# Patient Record
Sex: Male | Born: 1935 | Race: White | Hispanic: No | Smoking: Former smoker
Health system: Southern US, Community
[De-identification: ages and names within clinical notes are randomized; demographics above are authoritative.]

## PROBLEM LIST (undated history)

## (undated) DIAGNOSIS — I10 Essential (primary) hypertension: Secondary | ICD-10-CM

## (undated) DIAGNOSIS — Z955 Presence of coronary angioplasty implant and graft: Secondary | ICD-10-CM

## (undated) DIAGNOSIS — I82409 Acute embolism and thrombosis of unspecified deep veins of unspecified lower extremity: Secondary | ICD-10-CM

## (undated) DIAGNOSIS — Z96651 Presence of right artificial knee joint: Secondary | ICD-10-CM

## (undated) DIAGNOSIS — C801 Malignant (primary) neoplasm, unspecified: Secondary | ICD-10-CM

## (undated) DIAGNOSIS — Z9049 Acquired absence of other specified parts of digestive tract: Secondary | ICD-10-CM

---
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
## 2004-07-11 ENCOUNTER — Other Ambulatory Visit: Payer: Self-pay

## 2004-07-11 ENCOUNTER — Emergency Department: Payer: Self-pay | Admitting: Emergency Medicine

## 2004-08-22 HISTORY — PX: KNEE ARTHROSCOPY: SHX127

## 2004-08-22 HISTORY — PX: SHOULDER SURGERY: SHX246

## 2004-10-16 ENCOUNTER — Emergency Department: Payer: Self-pay | Admitting: General Practice

## 2004-10-16 ENCOUNTER — Other Ambulatory Visit: Payer: Self-pay

## 2005-03-25 ENCOUNTER — Ambulatory Visit: Payer: Self-pay | Admitting: Specialist

## 2005-03-30 ENCOUNTER — Ambulatory Visit: Payer: Self-pay | Admitting: Specialist

## 2005-09-28 ENCOUNTER — Inpatient Hospital Stay: Payer: Self-pay | Admitting: Specialist

## 2005-09-29 ENCOUNTER — Other Ambulatory Visit: Payer: Self-pay

## 2005-10-05 ENCOUNTER — Encounter: Payer: Self-pay | Admitting: Internal Medicine

## 2006-08-22 HISTORY — PX: JOINT REPLACEMENT: SHX530

## 2009-08-22 HISTORY — PX: CATARACT EXTRACTION W/ INTRAOCULAR LENS  IMPLANT, BILATERAL: SHX1307

## 2010-06-25 ENCOUNTER — Inpatient Hospital Stay: Payer: Self-pay | Admitting: Internal Medicine

## 2010-07-01 ENCOUNTER — Emergency Department: Payer: Self-pay | Admitting: Emergency Medicine

## 2010-07-05 ENCOUNTER — Other Ambulatory Visit: Payer: Self-pay | Admitting: Internal Medicine

## 2010-08-22 HISTORY — PX: CORONARY ANGIOPLASTY WITH STENT PLACEMENT: SHX49

## 2010-09-27 ENCOUNTER — Ambulatory Visit: Payer: Self-pay | Admitting: Anesthesiology

## 2010-10-01 ENCOUNTER — Ambulatory Visit: Payer: Self-pay | Admitting: Anesthesiology

## 2010-11-12 ENCOUNTER — Ambulatory Visit: Payer: Self-pay | Admitting: Anesthesiology

## 2010-12-13 ENCOUNTER — Ambulatory Visit: Payer: Self-pay | Admitting: Anesthesiology

## 2011-02-14 ENCOUNTER — Ambulatory Visit: Payer: Self-pay | Admitting: Anesthesiology

## 2011-03-02 ENCOUNTER — Inpatient Hospital Stay: Payer: Self-pay | Admitting: Internal Medicine

## 2011-04-20 ENCOUNTER — Ambulatory Visit: Payer: Self-pay | Admitting: Anesthesiology

## 2011-06-15 ENCOUNTER — Ambulatory Visit: Payer: Self-pay | Admitting: Anesthesiology

## 2011-11-09 ENCOUNTER — Ambulatory Visit: Payer: Self-pay | Admitting: Internal Medicine

## 2011-11-30 ENCOUNTER — Ambulatory Visit: Payer: Self-pay | Admitting: Physical Medicine and Rehabilitation

## 2012-03-14 ENCOUNTER — Ambulatory Visit: Payer: Self-pay | Admitting: Internal Medicine

## 2012-05-18 ENCOUNTER — Ambulatory Visit: Payer: Self-pay | Admitting: Unknown Physician Specialty

## 2012-10-07 ENCOUNTER — Observation Stay: Payer: Self-pay | Admitting: Internal Medicine

## 2012-10-07 LAB — CBC
HCT: 37.2 % — ABNORMAL LOW (ref 40.0–52.0)
HGB: 12.8 g/dL — ABNORMAL LOW (ref 13.0–18.0)
MCH: 29.8 pg (ref 26.0–34.0)
MCHC: 34.4 g/dL (ref 32.0–36.0)
RBC: 4.29 10*6/uL — ABNORMAL LOW (ref 4.40–5.90)
RDW: 13.2 % (ref 11.5–14.5)
WBC: 9 10*3/uL (ref 3.8–10.6)

## 2012-10-07 LAB — URINALYSIS, COMPLETE
Bilirubin,UR: NEGATIVE
Blood: NEGATIVE
Nitrite: NEGATIVE
Protein: 30
Squamous Epithelial: NONE SEEN

## 2012-10-07 LAB — COMPREHENSIVE METABOLIC PANEL
Albumin: 3.3 g/dL — ABNORMAL LOW (ref 3.4–5.0)
Anion Gap: 7 (ref 7–16)
BUN: 31 mg/dL — ABNORMAL HIGH (ref 7–18)
Bilirubin,Total: 1.2 mg/dL — ABNORMAL HIGH (ref 0.2–1.0)
Calcium, Total: 8.5 mg/dL (ref 8.5–10.1)
Chloride: 109 mmol/L — ABNORMAL HIGH (ref 98–107)
Co2: 25 mmol/L (ref 21–32)
Creatinine: 1.22 mg/dL (ref 0.60–1.30)
EGFR (African American): >60
EGFR (Non-African Amer.): 53 — ABNORMAL LOW
Glucose: 130 mg/dL — ABNORMAL HIGH (ref 65–99)
Osmolality: 290 (ref 275–301)
Potassium: 3.5 mmol/L (ref 3.5–5.1)

## 2012-10-07 LAB — TROPONIN I: Troponin-I: <0.02 ng/mL

## 2012-10-08 LAB — CBC WITH DIFFERENTIAL/PLATELET
Basophil #: 0 10*3/uL (ref 0.0–0.1)
Basophil %: 0.3 %
Eosinophil #: 0 10*3/uL (ref 0.0–0.7)
Eosinophil %: 0.3 %
HGB: 11.6 g/dL — ABNORMAL LOW (ref 13.0–18.0)
Lymphocyte #: 0.8 10*3/uL — ABNORMAL LOW (ref 1.0–3.6)
Lymphocyte %: 9.9 %
MCV: 88 fL (ref 80–100)
Monocyte #: 0.6 x10 3/mm (ref 0.2–1.0)
Monocyte %: 7.9 %
Neutrophil #: 6.4 10*3/uL (ref 1.4–6.5)
WBC: 7.8 10*3/uL (ref 3.8–10.6)

## 2012-10-08 LAB — BASIC METABOLIC PANEL
Anion Gap: 9 (ref 7–16)
BUN: 25 mg/dL — ABNORMAL HIGH (ref 7–18)
Calcium, Total: 8.1 mg/dL — ABNORMAL LOW (ref 8.5–10.1)
Co2: 26 mmol/L (ref 21–32)
EGFR (African American): 56 — ABNORMAL LOW
Glucose: 142 mg/dL — ABNORMAL HIGH (ref 65–99)
Potassium: 3.4 mmol/L — ABNORMAL LOW (ref 3.5–5.1)
Sodium: 143 mmol/L (ref 136–145)

## 2012-10-08 LAB — MAGNESIUM: Magnesium: 1.9 mg/dL

## 2012-10-08 LAB — TROPONIN I: Troponin-I: <0.02 ng/mL

## 2012-10-09 LAB — BASIC METABOLIC PANEL
Anion Gap: 9 (ref 7–16)
BUN: 21 mg/dL — ABNORMAL HIGH (ref 7–18)
Chloride: 112 mmol/L — ABNORMAL HIGH (ref 98–107)
Co2: 23 mmol/L (ref 21–32)
Creatinine: 1.17 mg/dL (ref 0.60–1.30)
EGFR (Non-African Amer.): 55 — ABNORMAL LOW
Glucose: 115 mg/dL — ABNORMAL HIGH (ref 65–99)
Potassium: 3.6 mmol/L (ref 3.5–5.1)
Sodium: 144 mmol/L (ref 136–145)

## 2012-10-09 LAB — CBC WITH DIFFERENTIAL/PLATELET
Basophil #: 0 10*3/uL (ref 0.0–0.1)
Basophil %: 0.6 %
Eosinophil %: 0.2 %
Lymphocyte %: 15.5 %
Lymphocytes: 15 %
MCH: 29.5 pg (ref 26.0–34.0)
MCV: 86 fL (ref 80–100)
Monocytes: 6 %
Neutrophil #: 3.6 10*3/uL (ref 1.4–6.5)
Segmented Neutrophils: 76 %

## 2012-10-09 LAB — LACTATE DEHYDROGENASE: LDH: 148 U/L (ref 85–241)

## 2012-10-09 LAB — URINE CULTURE

## 2012-10-10 LAB — PROTEIN ELECTROPHORESIS(ARMC)

## 2013-08-22 DIAGNOSIS — Z8589 Personal history of malignant neoplasm of other organs and systems: Secondary | ICD-10-CM

## 2013-08-22 HISTORY — DX: Personal history of malignant neoplasm of other organs and systems: Z85.89

## 2013-10-28 ENCOUNTER — Observation Stay: Payer: Self-pay | Admitting: Internal Medicine

## 2013-10-28 LAB — CBC WITH DIFFERENTIAL/PLATELET
BASOS ABS: 0 10*3/uL (ref 0.0–0.1)
Basophil %: 0.2 %
Eosinophil #: 0 10*3/uL (ref 0.0–0.7)
Eosinophil %: 0.6 %
HCT: 46 % (ref 40.0–52.0)
HGB: 15 g/dL (ref 13.0–18.0)
Lymphocyte #: 0.3 10*3/uL — ABNORMAL LOW (ref 1.0–3.6)
Lymphocyte %: 3.2 %
MCH: 29.5 pg (ref 26.0–34.0)
MCHC: 32.7 g/dL (ref 32.0–36.0)
MCV: 90 fL (ref 80–100)
MONOS PCT: 3.5 %
Monocyte #: 0.3 x10 3/mm (ref 0.2–1.0)
Neutrophil #: 7.5 10*3/uL — ABNORMAL HIGH (ref 1.4–6.5)
Neutrophil %: 92.5 %
Platelet: 99 10*3/uL — ABNORMAL LOW (ref 150–440)
RBC: 5.1 10*6/uL (ref 4.40–5.90)
RDW: 13.6 % (ref 11.5–14.5)
WBC: 8.1 10*3/uL (ref 3.8–10.6)

## 2013-10-28 LAB — COMPREHENSIVE METABOLIC PANEL
ALK PHOS: 62 U/L
Albumin: 3.8 g/dL (ref 3.4–5.0)
Anion Gap: 5 — ABNORMAL LOW (ref 7–16)
BILIRUBIN TOTAL: 0.8 mg/dL (ref 0.2–1.0)
BUN: 32 mg/dL — AB (ref 7–18)
Calcium, Total: 8.8 mg/dL (ref 8.5–10.1)
Chloride: 108 mmol/L — ABNORMAL HIGH (ref 98–107)
Co2: 28 mmol/L (ref 21–32)
Creatinine: 1.2 mg/dL (ref 0.60–1.30)
EGFR (Non-African Amer.): 53 — ABNORMAL LOW
GLUCOSE: 123 mg/dL — AB (ref 65–99)
OSMOLALITY: 290 (ref 275–301)
Potassium: 4 mmol/L (ref 3.5–5.1)
SGOT(AST): 19 U/L (ref 15–37)
SGPT (ALT): 22 U/L (ref 12–78)
SODIUM: 141 mmol/L (ref 136–145)
TOTAL PROTEIN: 7.1 g/dL (ref 6.4–8.2)

## 2013-10-28 LAB — CK-MB: CK-MB: 1 ng/mL (ref 0.5–3.6)

## 2013-10-28 LAB — TROPONIN I: Troponin-I: <0.02 ng/mL

## 2013-10-28 LAB — LIPASE, BLOOD: Lipase: 416 U/L — ABNORMAL HIGH (ref 73–393)

## 2013-10-29 LAB — CK-MB
CK-MB: 0.6 ng/mL (ref 0.5–3.6)
CK-MB: 0.8 ng/mL (ref 0.5–3.6)

## 2013-10-29 LAB — TROPONIN I: Troponin-I: <0.02 ng/mL

## 2013-10-29 LAB — CLOSTRIDIUM DIFFICILE(ARMC)

## 2013-10-30 LAB — BASIC METABOLIC PANEL
Anion Gap: 7 (ref 7–16)
BUN: 23 mg/dL — AB (ref 7–18)
CALCIUM: 7.9 mg/dL — AB (ref 8.5–10.1)
CHLORIDE: 109 mmol/L — AB (ref 98–107)
Co2: 24 mmol/L (ref 21–32)
Creatinine: 1.13 mg/dL (ref 0.60–1.30)
GFR CALC NON AF AMER: 57 — AB
Glucose: 90 mg/dL (ref 65–99)
OSMOLALITY: 283 (ref 275–301)
POTASSIUM: 3.2 mmol/L — AB (ref 3.5–5.1)
SODIUM: 140 mmol/L (ref 136–145)

## 2013-10-30 LAB — LIPASE, BLOOD: LIPASE: 94 U/L (ref 73–393)

## 2013-10-30 LAB — CBC WITH DIFFERENTIAL/PLATELET
BASOS PCT: 0.7 %
Basophil #: 0 10*3/uL (ref 0.0–0.1)
Eosinophil #: 0.1 10*3/uL (ref 0.0–0.7)
Eosinophil %: 1.6 %
HCT: 38.2 % — ABNORMAL LOW (ref 40.0–52.0)
HGB: 13.3 g/dL (ref 13.0–18.0)
LYMPHS ABS: 0.9 10*3/uL — AB (ref 1.0–3.6)
Lymphocyte %: 22.8 %
MCH: 31 pg (ref 26.0–34.0)
MCHC: 34.8 g/dL (ref 32.0–36.0)
MCV: 89 fL (ref 80–100)
MONO ABS: 0.4 x10 3/mm (ref 0.2–1.0)
MONOS PCT: 10.6 %
NEUTROS ABS: 2.6 10*3/uL (ref 1.4–6.5)
Neutrophil %: 64.3 %
Platelet: 75 10*3/uL — ABNORMAL LOW (ref 150–440)
RBC: 4.29 10*6/uL — AB (ref 4.40–5.90)
RDW: 13.5 % (ref 11.5–14.5)
WBC: 4.1 10*3/uL (ref 3.8–10.6)

## 2013-12-03 DIAGNOSIS — C4492 Squamous cell carcinoma of skin, unspecified: Secondary | ICD-10-CM

## 2013-12-03 HISTORY — DX: Squamous cell carcinoma of skin, unspecified: C44.92

## 2014-02-07 DIAGNOSIS — M169 Osteoarthritis of hip, unspecified: Secondary | ICD-10-CM | POA: Insufficient documentation

## 2014-02-07 DIAGNOSIS — Z9889 Other specified postprocedural states: Secondary | ICD-10-CM | POA: Insufficient documentation

## 2014-02-07 DIAGNOSIS — K219 Gastro-esophageal reflux disease without esophagitis: Secondary | ICD-10-CM | POA: Insufficient documentation

## 2014-02-26 LAB — COMPREHENSIVE METABOLIC PANEL
ALT: 24 U/L (ref 12–78)
AST: 15 U/L (ref 15–37)
Albumin: 3.4 g/dL (ref 3.4–5.0)
Alkaline Phosphatase: 53 U/L
Anion Gap: 10 (ref 7–16)
BILIRUBIN TOTAL: 1 mg/dL (ref 0.2–1.0)
BUN: 34 mg/dL — AB (ref 7–18)
CHLORIDE: 106 mmol/L (ref 98–107)
Calcium, Total: 8.3 mg/dL — ABNORMAL LOW (ref 8.5–10.1)
Co2: 25 mmol/L (ref 21–32)
Creatinine: 1.23 mg/dL (ref 0.60–1.30)
EGFR (Non-African Amer.): 52 — ABNORMAL LOW
GFR CALC AF AMER: 60 — AB
Glucose: 123 mg/dL — ABNORMAL HIGH (ref 65–99)
OSMOLALITY: 290 (ref 275–301)
POTASSIUM: 3.5 mmol/L (ref 3.5–5.1)
Sodium: 141 mmol/L (ref 136–145)
TOTAL PROTEIN: 6.5 g/dL (ref 6.4–8.2)

## 2014-02-26 LAB — TROPONIN I

## 2014-02-27 ENCOUNTER — Observation Stay: Payer: Self-pay | Admitting: Internal Medicine

## 2014-02-27 LAB — URINALYSIS, COMPLETE
Bacteria: NONE SEEN
Bilirubin,UR: NEGATIVE
Blood: NEGATIVE
Glucose,UR: NEGATIVE mg/dL (ref 0–75)
Leukocyte Esterase: NEGATIVE
Nitrite: NEGATIVE
PH: 7 (ref 4.5–8.0)
Protein: NEGATIVE
SPECIFIC GRAVITY: 1.019 (ref 1.003–1.030)
WBC UR: <1 /HPF (ref 0–5)

## 2014-02-27 LAB — CBC
HCT: 38.2 % — ABNORMAL LOW (ref 40.0–52.0)
HGB: 12.5 g/dL — ABNORMAL LOW (ref 13.0–18.0)
MCH: 30.5 pg (ref 26.0–34.0)
MCHC: 32.9 g/dL (ref 32.0–36.0)
MCV: 93 fL (ref 80–100)
Platelet: 88 10*3/uL — ABNORMAL LOW (ref 150–440)
RBC: 4.11 10*6/uL — ABNORMAL LOW (ref 4.40–5.90)
RDW: 13.8 % (ref 11.5–14.5)
WBC: 11.7 10*3/uL — ABNORMAL HIGH (ref 3.8–10.6)

## 2014-02-27 LAB — CK-MB
CK-MB: 1.4 ng/mL (ref 0.5–3.6)
CK-MB: 1.7 ng/mL (ref 0.5–3.6)

## 2014-02-27 LAB — CK TOTAL AND CKMB (NOT AT ARMC)
CK, Total: 92 U/L
CK-MB: 1.7 ng/mL (ref 0.5–3.6)

## 2014-02-27 LAB — TROPONIN I: Troponin-I: <0.02 ng/mL

## 2014-02-28 LAB — CBC WITH DIFFERENTIAL/PLATELET
BASOS ABS: 0 10*3/uL (ref 0.0–0.1)
BASOS PCT: 0.7 %
Eosinophil #: 0.1 10*3/uL (ref 0.0–0.7)
Eosinophil %: 1.5 %
HCT: 36.3 % — AB (ref 40.0–52.0)
HGB: 12 g/dL — AB (ref 13.0–18.0)
Lymphocyte #: 1.2 10*3/uL (ref 1.0–3.6)
Lymphocyte %: 25.9 %
MCH: 30.6 pg (ref 26.0–34.0)
MCHC: 33.2 g/dL (ref 32.0–36.0)
MCV: 92 fL (ref 80–100)
Monocyte #: 0.5 x10 3/mm (ref 0.2–1.0)
Monocyte %: 10.6 %
NEUTROS PCT: 61.3 %
Neutrophil #: 2.9 10*3/uL (ref 1.4–6.5)
Platelet: 87 10*3/uL — ABNORMAL LOW (ref 150–440)
RBC: 3.93 10*6/uL — ABNORMAL LOW (ref 4.40–5.90)
RDW: 14 % (ref 11.5–14.5)
WBC: 4.7 10*3/uL (ref 3.8–10.6)

## 2014-02-28 LAB — BASIC METABOLIC PANEL
ANION GAP: 9 (ref 7–16)
BUN: 23 mg/dL — ABNORMAL HIGH (ref 7–18)
CALCIUM: 8.4 mg/dL — AB (ref 8.5–10.1)
CO2: 24 mmol/L (ref 21–32)
CREATININE: 1.38 mg/dL — AB (ref 0.60–1.30)
Chloride: 109 mmol/L — ABNORMAL HIGH (ref 98–107)
EGFR (African American): 52 — ABNORMAL LOW
GFR CALC NON AF AMER: 45 — AB
Glucose: 95 mg/dL (ref 65–99)
Osmolality: 287 (ref 275–301)
POTASSIUM: 3.6 mmol/L (ref 3.5–5.1)
Sodium: 142 mmol/L (ref 136–145)

## 2014-02-28 LAB — MAGNESIUM: MAGNESIUM: 2.1 mg/dL

## 2014-03-04 LAB — CULTURE, BLOOD (SINGLE)

## 2014-12-12 NOTE — H&P (Signed)
PATIENT NAME:  Matthew, David MR#:  [id number] DATE OF BIRTH:  [birth year]  DATE OF ADMISSION:  10/07/2012  PRIMARY CARE PHYSICIAN: Tama High, III, MD   CHIEF COMPLAINT: Lightheadedness, near syncope.   HISTORY OF PRESENTING ILLNESS: The patient is an 81 year old patient with history of coronary artery disease, benign prostatic hypertrophy, hypertension, presents to the hospital complaining of 1 to 2 days of dizziness, feeling extremely weak. Today morning the patient had to lower himself secondary to significant lightheadedness in the rest room, and could not get up, and was brought in by EMS. Here the patient has been found to have a urinary tract infection. On standing him up, the patient felt extremely lightheaded and sat down. Orthostatics have not been done in the ER. Normal saline 1 liter has been given at this time. The patient is afebrile. White count is in the normal range. He is being admitted to the hospitalist service for observation for treatment of urinary tract infection and hydration.   PAST MEDICAL HISTORY:  Benign prostatic hypertrophy, coronary artery disease, hypertension, severe arthritis, anxiety.   PAST SURGICAL HISTORY: Cardiac cath.   SOCIAL HISTORY: The patient does not smoke. No alcohol. No illicit drugs. He lives at home with his wife, uses a walker at times.   CODE STATUS:  FULL CODE.     ALLERGIES: No known drug allergies.   FAMILY HISTORY: Reviewed, unknown.   REVIEW OF SYSTEMS: CONSTITUTIONAL: No fever, complains of fatigue and weakness.  EYES: No blurred vision, pain, redness.  ENT: No tinnitus, ear pain, hearing loss.   RESPIRATORY: No cough, wheeze, hemoptysis.  CARDIOVASCULAR: No chest pain, orthopnea, edema.  GASTROINTESTINAL: Had some nausea. No vomiting, diarrhea. Has no abdominal pain.  GENITOURINARY: No dysuria, has decreased urination.  ENDOCRINE: No polyuria, nocturia, or thyroid problems.  HEMATOLOGIC/LYMPHATIC: No anemia, easy bruising,  bleeding. Has thrombocytopenia.  MUSCULOSKELETAL: Has arthritis and back pain.  NEUROLOGIC: No focal numbness, weakness, dysarthria.  PSYCHIATRIC: No insomnia or depression.   HOME MEDICATIONS: 1. Aspirin 81 mg oral once a day.  2. Centrum 1 tablet oral once a day.  3. Diovan/hydrochlorothiazide 320/25 oral once a day.  4. Etodolac 400 mg oral 2 times a day.  5. Hydrocodone/Tylenol 5/325,  1 tablet as needed.  6. Lorazepam 1 mg orally as needed.  7. Lovastatin 20 mg oral once a day at bedtime.  8. Metoprolol 25 mg, 1/2 tablet oral 2 times a day.  9. Protonix 40 mg oral 2 times a day.  10. Tylenol arthritis 1 tablet 2 times a day.   PHYSICAL EXAMINATION: VITAL SIGNS: Temperature 98.7, pulse 96, respirations 20, blood pressure 120/63, saturating 92% on room air.  GENERAL: Elderly Caucasian male patient lying in bed, comfortable, conversational, cooperative with exam.  PSYCHIATRIC: Alert and oriented x 3. Mood and affect appropriate. Judgment intact.  HEENT: Atraumatic, normocephalic. Oral mucosa dry and pink. External ears and nose normal. No pallor. No icterus. Pupils bilaterally equal and reactive to light.  NECK: Supple. No thyromegaly. No palpable lymph nodes. Trachea midline. No carotid bruits, JVD.  CARDIOVASCULAR: S1, S2, regular rate and rhythm. Peripheral pulses 2+. No edema.  RESPIRATORY: Normal work of breathing. Clear to auscultation on both sides.  GASTROINTESTINAL: Soft abdomen, nontender. Bowel sounds present. No hepatosplenomegaly palpable.  SKIN: Warm and dry. No petechiae, rash, ulcers.  MUSCULOSKELETAL: No joint swelling, redness, or effusion of the large joints. Arthritic changes found.  NEUROLOGICAL: Motor strength 5 out of 5 in upper and lower extremities. Sensation  to light touch intact.  Cerebellar function with Romberg sign  tested. Finger-nose test normal.  LYMPHATIC: No cervical lymphadenopathy.    LABORATORY, DIAGNOSTIC AND RADIOLOGICAL DATA:  Glucose 130,  BUN 31, creatinine 1.22, sodium 141, potassium 3.5. Albumin, AST, ALT, alkaline phosphatase normal. Troponin less than 0.02. WBC 9, hemoglobin 12.8, platelets 101. Urinalysis shows 12 WBC and trace bacteria.   EKG shows normal sinus rhythm. No acute ST-T wave changes.   ASSESSMENT AND PLAN: 1. Near syncope: Likely secondary to dehydration and UTI. The patient is unable to stand at this time and has significant lightheadedness and likely will have his orthostatics positive. We will check orthostatics. We will also start him on IV fluids, treat the UTI with ciprofloxacin and await urine cultures. The patient is afebrile, no elevated white count. He will be admitted under observation. We will consult Physical Therapy.   2. Thrombocytopenia, chronic: The patient has had mild thrombocytopenia in the past with platelets around 100, which is stable at this time. No petechia. No bleeding.  3. Hypertension: We will hold the patient's hydrochlorothiazide secondary to dehydration, continue his metoprolol and Diovan.  4. Coronary artery disease: Stable.  5. Deep vein prophylaxis: With heparin.   CODE STATUS:  FULL CODE.     TIME SPENT: Time spent today on this case was 50 minutes. ____________________________ Leia Alf Kelii Chittum, MD srs:cb D: 10/07/2012 16:26:23 ET T: 10/07/2012 16:45:37 ET JOB#: [id number]  cc: Alveta Heimlich R. Darvin Neighbours, MD, <Dictator> Tama High III, MD Neita Carp MD ELECTRONICALLY SIGNED 10/10/2012 5:15

## 2014-12-12 NOTE — Discharge Summary (Signed)
PATIENT NAME:  Matthew David, PEKALA MR#:  [id number] DATE OF BIRTH:  [birth year]  DATE OF ADMISSION:  10/07/2012 DATE OF DISCHARGE:  10/09/2012  DISCHARGE DIAGNOSES:  1.  Dehydration.  2.  Urinary tract infection, likely cause of #1.  3.  Near syncope secondary to dehydration and urinary tract infection.  4.  Coronary artery disease.  5.  Benign prostatic hypertrophy.  6.  Hypertension.  7.  Acute renal failure.  8.  Chronic kidney disease, stage III.  9.  History of pancytopenia, with progressive thrombocytopenia during this hospitalization.   HISTORY AND PHYSICAL: Please see dictated admission history and physical.   HOSPITAL COURSE: The patient was admitted with pain in the testicles and the suprapubic region and difficulty voiding. He had not been eating and drinking well and had developed some lightheadedness. He was found to have evidence of dehydration and acute renal failure secondary to above. He was placed on antibiotics and IV fluids. His creatinine normalized. His testicular symptoms improved. He was in and out of catheterized 1 time but did not require further catheter. He was initiated on tamsulosin, which he tolerated. His blood pressure medications were initially held and then reinstituted when his blood pressure improved.   He ambulated with physical therapy, who noted that he was able to go 230 feet, but we were worried about his balance and recommended that he use a walker. Subsequently, the patient was ambulating with holding onto his IV pole, and he states that he felt like his balance was appropriate with this. He did not wish physical therapy, did not wish a walker. We discussed the risks of fall with the patient as well as the recommendation of physical therapy; however, again he had declined these modalities and appeared to understand the potential risks of not pursuing them.   He has a history of pancytopenia, his baseline platelet level is around 100. This was slightly lower  during his hospitalization, thought to be in part due to his infection as well as possibly heparin. He had been on NSAIDs before, which were held given his renal failure and his platelets, and will continue to be held at this point. He is maintained on low-dose aspirin secondary to history of coronary artery disease, and this will be continued. He really did not have any episodes of bleeding. Further work-up of progressive thrombocytopenia was initiated during his hospitalization and will be followed up as an outpatient.   He states that he was not sleeping well, and appetite was still not good, and he wanted to try something for this, and mirtazapine will be added. He feels ready to go home, however, so he will be discharged to home in stable condition with physical activity to be up as tolerated with a cane. If he feels like he is having more trouble with his walking and balance, he is to contact us and we can initiate home health at that time and get him a prescription for a walker. He should follow a 2 gram sodium diet. He will follow-up in our office within the next 1 week, and we will recheck his renal function and platelet counts at that point. He has an appointment coming up with Dr. Maryan Puls, who he has seen as an outpatient before, in the next 2 weeks, and he is to keep this appointment as scheduled.   DISCHARGE MEDICATIONS: 1.  Medicated mouthwash as needed for mouth ulcers.  2.  Aspirin 81 mg p.o. daily.  3.  Multivitamin 1 p.o.  daily.  4.  Zyrtec 10 mg p.o. daily.  5.  Lovastatin 20 mg p.o. at bedtime.  6.  Lopressor 25 mg, 1/2 tablet p.o. b.i.d.  7.  Pantoprazole 40 mg p.o. b.i.d.  8.  Diovan/HCTZ 320/25 mg, 1 p.o. daily.  9.  Lorazepam 1 mg p.o. at bedtime.  10.  Tamsulosin 0.4 mg p.o. at bedtime.  11.  Mirtazapine 7.5 mg p.o. at bedtime, added to help sleep and appetite, side effects discussed.  12.  Tessalon 100 mg p.o. q. 6 hours as needed for cough.  13.  Astelin 1 spray each  nostril b.i.d. x 7 days.  14.  Cipro 250 mg p.o. b.i.d. x 14 days.  15.  Norco 5/325 mg, 1 p.o. q. 6 hours as needed for pain.  NOTE:  At this time, he will hold the etodolac. We will readdress this at his follow-up.  ____________________________ Adin Hector, MD bjk:cb D: 10/09/2012 07:46:17 ET T: 10/09/2012 11:36:44 ET JOB#: [id number] cc: Tama High III, MD, <Dictator> Ramonita Lab MD ELECTRONICALLY SIGNED 10/09/2012 12:44

## 2014-12-13 NOTE — Discharge Summary (Signed)
PATIENT NAME:  Matthew David, Matthew David MR#:  [id number] DATE OF BIRTH:  07-Aug-1922  DATE OF ADMISSION:  10/28/2013 DATE OF DISCHARGE:  10/30/2013   FINAL DIAGNOSES:  1. Nausea, vomiting and diarrhea, likely due to viral gastroenteritis.  2. Dehydration secondary to #1.  3. Coronary artery disease.  4. Benign prostatic hypertrophy.  5. Arthritis.   HISTORY AND PHYSICAL: Please see dictated admission history and physical.   HOSPITAL COURSE: The patient was admitted with abdominal and chest discomfort, but mainly abdominal discomfort, also associated with diarrhea. Elevated lipase was noted on his admission. Cardiac enzymes were followed, which were negative. Cardiology was consulted as the patient had been set up for an outpatient stress test; however, symptoms appeared to be more GI in nature, and the stress test was discontinued. The patient would prefer to follow this up as an outpatient, which seems reasonable. He underwent abdominal ultrasound due to elevation of his lipase; this was unremarkable, and his lipase returned to normal. He had no further nausea, vomiting. He had loose stools over the first 24 hours. Stool was sent for C. difficile, which was positive for antigen, but negative for toxin, suggesting nontoxigenic bacteria in the gut. and did not require treatment. His diarrhea resolved, he was able to tolerate diet and was ambulating in the room and felt ready for discharge to home. At this time, he will be discharged home in stable condition with physical activity up as tolerated. He will follow up in our office in the next 7 to 10 days. He was recommended to follow a 2 gram sodium diet, to keep his diet bland over the next 3 to 4 days and to avoid dairy except daily yogurt over the next 3 to 4 days as well.   DISCHARGE MEDICATIONS:  1. Lovastatin 20 mg p.o. at bedtime.  2. Norco 10/325 one p.o. 1 to 3 times a day as needed for pain.  3. Lorazepam 1 mg p.o. at bedtime.  4. Metoprolol tartrate  25 mg 1/2 tablet p.o. b.i.d.  5. Mirtazapine 15 mg 1/2 tablet p.o. at bedtime.  6. Pantoprazole 40 mg p.o. b.i.d.  7. Tamsulosin 0.4 mg p.o. daily.  8. Diovan/hydrochlorothiazide 320/12.5 mg 1/2 tablet p.o. daily.  9. Align 1 p.o. daily.  10. Aspirin 81 mg p.o. daily.  11. Multivitamin 1 p.o. daily.  12. Cetirizine 10 mg p.o. daily. 13. Cranberry tablet 1 tablet p.o. b.i.d.  14. Lysine 1000 mg p.o. daily.  15. Prostate SR 320 mg 1 p.o. daily.  16. Magic Mouthwash suspension 5 mL p.o. daily as needed for mouth sores.  17. Celebrex 200 mg p.o. daily as needed for pain.   ____________________________ Adin Hector, MD bjk:lb D: 10/30/2013 08:03:17 ET T: 10/30/2013 08:47:30 ET JOB#: [id number]  cc: Adin Hector, MD, <Dictator> Ramonita Lab MD ELECTRONICALLY SIGNED 11/11/2013 7:22

## 2014-12-13 NOTE — H&P (Signed)
PATIENT NAME:  Matthew David, Matthew David MR#:  [id number] DATE OF BIRTH:  [birth year]  DATE OF ADMISSION:  10/28/2013  PRIMARY CARE PHYSICIAN:  Dr. Caryl Comes.  EMERGENCY ROOM PHYSICIAN:  Dr. Winfred Leeds.   CHIEF COMPLAINT: Abdominal pain.   HISTORY OF PRESENT ILLNESS: An 81 year old male patient with history of hypertension, GERD, coronary artery disease, comes in because of abdominal pain, mainly epigastric area, associated with 1 episode of nausea, vomiting and diarrhea. The patient also had dull left-sided chest discomfort, which was relieved by the time he came to the Emergency Room. The patient right now denies any complaints. He is supposed to have a stress test on Wednesday at Dr. Saralyn Pilar' office.   PAST MEDICAL HISTORY: Significant for history of hypertension, GERD, coronary artery disease and history of BPH, severe arthritis and anxiety.   PAST SURGICAL HISTORY: Significant for history of tonsillectomy, appendectomy, stent placement 2 years ago for coronary artery disease.   SOCIAL HISTORY: No smoking, no drinking. Lives with the wife.   ALLERGIES:  No known allergies.  MEDICATIONS:  1.  Percocet 10/325, 1 tablet p.o. t.i.d. 2.  Aspirin 81 mg daily.  3.  Align 4 mg p.o. daily.  4.  Celebrex 200 mg once a day.  5.  Cetirizine 10 mg p.o. daily.  6.  Hydrochlorothiazide with valsartan 12.5/320, half tablet once a day.  7.  Ativan 1 mg p.o. daily.  8.  Lysine 1 gram p.o. daily.  9.  Lovastatin 20 mg p.o. daily.  10.  Metoprolol 25 mg half tablet p.o. b.i.d.  11.  Mirtazapine 15 mg half tablet at bedtime.  12.  Pantoprazole 40 mg p.o. b.i.d.   13.  Prostate SR 320 mg.   15.  Tamsulosin 0.4 mg p.o. daily.  16.  Tylenol as needed.    REVIEW OF SYSTEMS:   CONSTITUTIONAL: Has no fever. No fatigue.  EYES: No blurred vision.  ENT: No tinnitus. No epistaxis. No difficulty swallowing.  RESPIRATORY: No cough. No wheezing.  CARDIOVASCULAR: The patient has left-sided chest pain, which is resolved  now. No orthopnea. No PND. No pedal edema.  GASTROINTESTINAL: The patient did have nausea, vomiting, diarrhea and midepigastric pain today. The patient had 3 episodes of diarrhea and 1 episode of vomiting. The patient had epigastric pain, unable to tell if it has radiated to the back because he has all this chronic back pain.  GENITOURINARY: No dysuria.  ENDOCRINE: No polyuria or nocturia.  HEMATOLOGIC: No anemia.  INTEGUMENTARY: No skin rashes.  MUSCULOSKELETAL: Has chronic back pain.  NEUROLOGIC: No numbness or weakness.  PSYCHIATRIC: No anxiety or insomnia.   PHYSICAL EXAMINATION: VITAL SIGNS: Temperature is a 98.4. Heart rate is 100, blood pressure 132/64, sats 98% on room air.  GENERAL:  He is alert, awake, oriented, elderly male, not in distress, answering questions appropriately.  HEENT: Head atraumatic, normocephalic.  EYES: Pupils equally reacting to light. Extraocular movements intact.  NOSE: No nasal lesions. No drainage.  EARS: No drainage. No external lesions.  MOUTH: No lesions. No exudates.  NECK: Supple. No JVD. No carotid bruit.  Normal range of motion.  RESPIRATORY: Good respiratory effort. Clear to auscultation. No wheeze. No rales.  CARDIOVASCULAR: S1, S2 regular. No murmurs. The patient has no chest wall tenderness. PMI is not displaced. The patient's pedal pulses are intact.  GASTROINTESTINAL: Abdomen is soft, nontender, nondistended. Bowel sounds present.   MUSCULOSKELETAL: Able to move all extremities. No tenderness or effusion.  SKIN: Inspection is normal.  NEUROLOGIC: Alert, awake, oriented. Cranial  nerves II through XII are intact. Power 5/5 in upper and lower extremities. Sensation is intact. DTRs 2+ bilaterally.  PSYCHIATRIC: Mood and affect are within normal limits.   LABORATORY DATA: WBC 8.1, hemoglobin 15, hematocrit 46, platelets 99.   ELECTROLYTES: Sodium is 141, potassium 4, chloride 108, bicarb 28, BUN 32, creatinine 1.2, glucose 123. Electrolytes are  within normal limits. LFTs are within normal limits. Lipase is 416.   EKG shows sinus tachycardia with 101 beats minutes. No ST-T changes.   ASSESSMENT AND PLAN:  An 81 year old male patient with coronary artery disease, comes in with dull left-sided chest pain. The patient is supposed to have stress test with Dr. Saralyn Pilar on  Wednesday. Because of his heart disease, coronary artery disease, we are going to give him  observation, follow the 2 sets of troponins, then obtain a stress test in the morning and continue him on aspirin, nitroglycerin and statins.   The patient's other diagnoses include:  1.  Mild acute pancreatitis, likely secondary to his underlying GERD. His electrolytes are normal. He does not have anymore abdominal pain. His lipase is only in upper limits, so at this time, I really do not think he has any acute pancreatitis. We will continue conservative treatment with pain meds and nausea medication.  2.  Gastroesophageal reflux disease. Continue PPIs.  3.  History of anxiety. Continue his home medications with Ativan.  4.  Arthritis. He is on Celebrex. Continue Celebrex 200 mg p.o. daily.  5.  For hypertension, is on beta blockers. Continue metoprolol 25 mg half tablet p.o. b.i.d.  6.  History of benign prostatic hypertrophy. Continue Flomax 0.4 mg in the evening.  7.  History of depression. He is on mirtazapine 15 mg half tablet at bedtime; continue that.   Time spent is about 55 minutes.   We will sign off to Dr. Caryl Comes.   ____________________________ Epifanio Lesches, MD sk:dmm D: 10/28/2013 20:59:00 ET T: 10/28/2013 21:58:46 ET JOB#: [id number]  cc: Epifanio Lesches, MD, <Dictator> Epifanio Lesches MD ELECTRONICALLY SIGNED 11/04/2013 11:56

## 2014-12-13 NOTE — Consult Note (Signed)
Brief Consult Note: Diagnosis: 41 uyo male with history of cad admitted with abdominal pain and nausea. Ruled out for mi. Had functionals study scheduled as outpatient.   Patient was seen by consultant.   Recommend further assessment or treatment.   Comments: 81 yo with history of cad s/p pci of mid lad in 2012 admitted with abdominal pain and diarhea. Has ruled out for mi. EKG is unremarkable. Discussed consideration for funcitnal study in am with patient. He does not feel up to at present. I feel this can be postponed until he is out of the hospital and back to his baseline. Would ambulate and complete abdominal workup. No further cardiac tests as inpatient at present unless symptoms change.  Electronic Signatures: Teodoro Spray (MD)  (Signed 10-Mar-15 17:21)  Authored: Brief Consult Note   Last Updated: 10-Mar-15 17:21 by Teodoro Spray (MD)

## 2014-12-13 NOTE — Consult Note (Signed)
PATIENT NAME:  Matthew David, Matthew David MR#:  [id number] DATE OF BIRTH:  04-25-1922  DATE OF CONSULTATION:  02/27/2014  REFERRING PHYSICIAN:  Ramonita Lab, MD CONSULTING PHYSICIAN:  Janyiah Silveri D. Clayborn Bigness, MD  CARDIOLOGIST: Dr. Saralyn Pilar  INDICATION: Unstable angina.   HISTORY OF PRESENT ILLNESS: The patient is an 81 year old white male with known coronary artery disease status post STEMI in 2012, had a drug-eluting stent in July 2012, has had recurrent chest pain symptoms on and off. In March of this year was seen by Dr. Saralyn Pilar and had a stress test that did show evidence of inferior wall ischemia but was treated medically. The patient presents again with recurrent worsening chest pain so is admitted for further evaluation and care. His troponins are negative, but he has persistent pain and now needs further evaluation.   PAST MEDICAL HISTORY: Coronary artery disease, BPH, severe arthritis, anxiety, depression, hiatal hernia, reflux.   PAST SURGICAL HISTORY: Cardiac cath, PCI and stent.   SOCIAL HISTORY: No smoking or alcohol consumption. Retired.   ALLERGIES: None.   FAMILY HISTORY: Myocardial infarction.  CODE STATUS: FULL.  MEDICATIONS: He is on aspirin 81 mg a day, cetirizine 10 mg a day, Tylenol p.r.n., Ativan 1 mg at bedtime as needed, metoprolol 25 one-half tablet twice a day, hydrochlorothiazide/Diovan 320/12.5 one-half pill a day, lovastatin 20 mg at bedtime, Protonix 40 two tablets a day, mirtazapine 15 mg 1/2 tablet at bedtime, Celebrex 200 mg as needed, Flomax 0.4 mg a day, Align capsule 4 mg orally 2 times a day.   REVIEW OF SYSTEMS: No blackout spells or syncope. No nausea or vomiting. No fever, no chills, and no sweats. No weight loss, no weight gain, no hemoptysis or hematemesis. No bright red blood per rectum. Denies vision change. No hearing change. Denies sputum production or cough.   PHYSICAL EXAMINATION: VITAL SIGNS: Blood pressure 130/60, pulse 80, respiratory rate 15, afebrile.   HEENT: Normocephalic, atraumatic. Pupils equal and reactive to light.  NECK: Supple. No significant JVD, bruits or adenopathy.  LUNGS: Clear to auscultation and percussion. No significant wheeze, rhonchi, or rale.  HEART: Regular rate and rhythm.  ABDOMEN: Positive bowel sounds. No rebound tenderness or hepatosplenomegaly. EXTREMITY: Within normal limits.  NEUROLOGIC: Intact.  SKIN: Normal.   DIAGNOSTIC DATA: Glucose 123, BUN 34, creatinine 1.23, sodium 141, potassium 3.5, chloride 106, CO2 25. Troponin less than 0.02. White count 11.7, hemoglobin 12.5, hematocrit 38.2, platelet count 88,000.   EKG: Normal sinus rhythm, nonspecific ST-T wave changes.   ASSESSMENT: 1.  Angina.  2.  Chest pain. 3.  Coronary artery disease. 4.  Thrombocytopenia. 5.  Low-grade fever. 6.  Benign prostatic hypertrophy.  7.  Hypertension.  8.  Reflux.  9.  Hiatal hernia.   PLAN: 1.  Agree with admit. Rule out for myocardial infarction. Follow up cardiac enzymes. Continue telemetry. Follow up EKG. Short-term anticoagulation. Because he recently had a stress test would prefer to proceed with cardiac catheterization to rule out significant coronary artery disease as part of the etiology for his recurrent persistent chest pain. He has known coronary artery disease status post PCI and stent so cardiac catheterization would help to rule out significant coronary artery disease worsening.  2.  Continue reflux therapy for hiatal hernia. Continue his usual home medications. 3.  Thrombocytopenia. Appears to be chronic. Continue to follow.  4.  Fever of unclear etiology. Would consider further evaluation as necessary. 5.  Benign prostatic hypertrophy. Continue Flomax. 6.  Hypertension. Continue his usual medications. He is  on Diovan and metoprolol for now.  7.  Continue deep vein thrombosis prophylaxis. Again, the plan is to proceed with cardiac catheterization to rule out significant coronary artery disease as an  etiology of his recent symptoms.  ____________________________ Loran Senters Clayborn Bigness, MD ddc:sb D: 02/27/2014 15:41:53 ET T: 02/27/2014 16:31:35 ET JOB#: [id number]  cc: Vali Capano D. Clayborn Bigness, MD, <Dictator> Yolonda Kida MD ELECTRONICALLY SIGNED 04/04/2014 12:30

## 2014-12-13 NOTE — H&P (Signed)
PATIENT NAME:  Matthew David, BLANKENSHIP MR#:  [id number] DATE OF BIRTH:  03/11/1922  DATE OF ADMISSION:  02/26/2014  REFERRING PHYSICIAN:  Dr. Marjean Donna.   PRIMARY CARE PHYSICIAN:  Dr. Ramonita Lab.   PRIMARY CARDIOLOGIST:  Dr. Saralyn Pilar.   CHIEF COMPLAINT:  Chest pain.   HISTORY OF PRESENT ILLNESS:  This is an 81 year old male with known history of coronary artery disease, status post non-STEMI in 2012, with drug-eluting stent in July 2012, the patient presents with complaints of chest pain, the patient reports she had a similar episode in March of this year where he was seen by his cardiology Dr. Saralyn Pilar where he had a stress test which was abnormal and it did show evidence revealing mild inferior wall ischemia, the patient reports currently is chest pain-free after receiving morphine and nitro, as well the patient received aspirin in ED, reports some nausea accompanying the chest pain, but denies any palpitation, diaphoresis, dizziness or lightheadedness, the patient's EKG did not have any acute changes.  His troponin was negative, hospitalist service requested to admit the patient for further evaluation.  He denies cough, fever, chills, hemoptysis, leg edema or swelling.   PAST MEDICAL HISTORY: 1.  Coronary artery disease.  2.  BPH.  3.  Severe arthritis.  4.  Anxiety.  5.  Hypertension.   PAST SURGICAL HISTORY:  Cardiac cath.   SOCIAL HISTORY:  The patient does not smoke.  No alcohol.  No illicit drug use.  He lives at home, uses a walker at times.   CODE STATUS:  FULL CODE.   ALLERGIES:  No known drug allergies.    FAMILY HISTORY:  Father died status post MI age 51.   HOME MEDICATIONS: 1.  Aspirin 81 mg oral daily.  2.  Cetirizine 10 mg oral daily.  3.  Tylenol as needed.  4.  Ativan 1 mg 1 tablet at bedtime as needed.  5.  Metoprolol tartrate 25 mg oral 1/2 tablet 2 times a day.  6.  Valsartan/hydrochlorothiazide 320/12.5 mg 1/2 tablet oral daily.  7.  Lovastatin 20 mg oral at  bedtime.  8.  Pantoprazole 40 mg oral 2 times a day.  9.  Mirtazapine 15 mg 1/2 tablet at bedtime.  10.  Celebrex 200 mg oral daily as needed.  11.  Flomax 0.4 mg oral daily.  12.  Align capsule 4 mg oral 2 times a day.  [name]  REVIEW OF SYSTEMS:  CONSTITUTIONAL:  Denies fever, chills, fatigue, weakness.  EYES:  Denies blurry vision, double vision, inflammation, glaucoma. EARS, NOSE, THROAT:  Denies tinnitus, ear pain, hearing loss, epistaxis or discharge. RESPIRATORY:  Denies cough, wheezing, hemoptysis, COPD. CARDIOVASCULAR:  Reports chest pain, currently chest pain-free.  Denies orthopnea, edema, palpitations, syncope.  GASTROINTESTINAL:  Reports nausea.  Denies vomiting, diarrhea, abdominal pain, hematemesis, jaundice.  GENITOURINARY:  Denies dysuria, hematuria, renal colic.  ENDOCRINE:  Denies polyuria, polydipsia, heat or cold intolerance.  HEMATOLOGY:  Denies anemia, easy bruising, bleeding diathesis.  INTEGUMENT:  Denies acne, rash or skin lesion.  MUSCULOSKELETAL:  Denies any gout, swelling cramps.  Reports history of arthritis.  NEUROLOGIC:  Denies history of CVA, TIA, tingling, numbness.  PSYCHIATRIC:  Denies anxiety, insomnia, or depression.   PHYSICAL EXAMINATION: VITAL SIGNS:  Temperature 100.5, pulse 88, respiratory rate 19, blood pressure 127/62, saturating 99% on room.  GENERAL:  Well-nourished male, looks comfortable in bed, in no apparent distress.  HEENT:  Head atraumatic, normocephalic.  Pupils equal, reactive to light.  Pink conjunctivae.  Anicteric  sclerae.  Moist oral mucosa.  NECK:  Supple.  No thyromegaly.  No JVD.  CHEST:  Good air entry bilaterally.  No wheezing, rales or rhonchi.  CARDIOVASCULAR:  S1, S2 heard.  No rubs, murmurs, gallops.  ABDOMEN:  Soft, nontender, nondistended.  Bowel sounds present.  EXTREMITIES:  No edema.  No clubbing.  No cyanosis.  Pedal pulses felt bilaterally.  PSYCHIATRIC:  Appropriate affect.  Awake, alert x 3.  Intact judgment and  insight.  NEUROLOGIC:  Cranial nerves grossly intact.  Motor 5 out of 5.  No focal deficits.  SKIN:  Normal skin turgor.  Warm and dry.  MUSCULOSKELETAL:  No joint effusion or erythema.   PERTINENT LABORATORY DATA:  Glucose 123, BUN 34, creatinine 1.23, sodium 141, potassium 3.5, chloride 106, CO2 25.  Troponin less than 0.02.  White blood cells 11.7, hemoglobin 12.5, hematocrit 38.2, platelets 88.  EKG showing normal sinus rhythm at 97 beats per minutes without significant ST or T wave abnormalities.   ASSESSMENT AND PLAN: 1.  Chest pain, currently resolved, the patient has significant cardiac history so he will be admitted for further work-up, we will admit him to telemetry, continue to cycle his cardiac enzymes, follow the trend, he already received aspirin.  We will keep him on sublingual nitroglycerin as needed, the patient had recently abnormal stress test so we will consult cardiology to see what is their recommendation regarding his further work-up at this point.  2.  Thrombocytopenia, chronic, at baseline.  3.  Fever.  Unclear etiology, urinalysis negative.  Chest x-ray is negative.  We will follow on the blood cultures.  Would not start any antibiotics as the patient does not appear to be septic.  4.  Benign prostatic hypertrophy.  We will continue the patient on Flomax.  5.  Hypertension.  Blood pressure on the lower side.  We will hold his medications.  We will continue him only on Diovan and metoprolol.    6.  Deep vein thrombosis prophylaxis.  SubQ heparin.  7.  CODE STATUS:  FULL CODE.   Total time spent on admission and patient care 55 minutes.     ____________________________ Albertine Patricia, MD dse:ea D: 02/27/2014 05:34:08 ET T: 02/27/2014 06:23:30 ET JOB#: [id number]  cc: Albertine Patricia, MD, <Dictator> DAWOOD Graciela Husbands MD ELECTRONICALLY SIGNED 02/28/2014 0:52

## 2015-01-27 ENCOUNTER — Other Ambulatory Visit: Payer: Self-pay | Admitting: Internal Medicine

## 2015-01-27 DIAGNOSIS — R1031 Right lower quadrant pain: Secondary | ICD-10-CM

## 2015-01-30 ENCOUNTER — Ambulatory Visit
Admission: RE | Admit: 2015-01-30 | Discharge: 2015-01-30 | Disposition: A | Discharge status: Home / Self Care | Payer: Medicare Other | Source: Ambulatory Visit | Attending: Internal Medicine | Admitting: Internal Medicine

## 2015-01-30 DIAGNOSIS — R1031 Right lower quadrant pain: Secondary | ICD-10-CM | POA: Insufficient documentation

## 2015-01-30 HISTORY — DX: Presence of coronary angioplasty implant and graft: Z95.5

## 2015-01-30 HISTORY — DX: Essential (primary) hypertension: I10

## 2015-01-30 HISTORY — DX: Presence of right artificial knee joint: Z96.651

## 2015-01-30 HISTORY — DX: Acquired absence of other specified parts of digestive tract: Z90.49

## 2015-01-30 MED ORDER — IOHEXOL 300 MG/ML  SOLN
100.0000 mL | Freq: Once | INTRAMUSCULAR | Status: AC | PRN
  Administered 2015-01-30: 100 mL via INTRAVENOUS

## 2015-03-04 ENCOUNTER — Other Ambulatory Visit: Payer: Self-pay | Admitting: Specialist

## 2015-03-04 DIAGNOSIS — M48061 Spinal stenosis, lumbar region without neurogenic claudication: Secondary | ICD-10-CM

## 2015-03-06 ENCOUNTER — Ambulatory Visit
Admission: RE | Admit: 2015-03-06 | Discharge: 2015-03-06 | Disposition: A | Discharge status: Home / Self Care | Payer: Medicare Other | Source: Ambulatory Visit | Attending: Specialist | Admitting: Specialist

## 2015-03-06 DIAGNOSIS — M4806 Spinal stenosis, lumbar region: Secondary | ICD-10-CM | POA: Diagnosis not present

## 2015-03-06 DIAGNOSIS — G544 Lumbosacral root disorders, not elsewhere classified: Secondary | ICD-10-CM | POA: Insufficient documentation

## 2015-03-06 DIAGNOSIS — M48061 Spinal stenosis, lumbar region without neurogenic claudication: Secondary | ICD-10-CM

## 2015-03-31 ENCOUNTER — Encounter
Admission: RE | Admit: 2015-03-31 | Discharge: 2015-03-31 | Disposition: A | Discharge status: Home / Self Care | Payer: Medicare Other | Source: Ambulatory Visit | Attending: Otolaryngology | Admitting: Otolaryngology

## 2015-03-31 DIAGNOSIS — Z01812 Encounter for preprocedural laboratory examination: Secondary | ICD-10-CM | POA: Diagnosis present

## 2015-03-31 LAB — DIFFERENTIAL
BASOS ABS: 0.1 10*3/uL (ref 0–0.1)
Basophils Relative: 1 %
Eosinophils Absolute: 0.1 10*3/uL (ref 0–0.7)
Eosinophils Relative: 1 %
LYMPHS ABS: 1.8 10*3/uL (ref 1.0–3.6)
LYMPHS PCT: 29 %
MONO ABS: 0.6 10*3/uL (ref 0.2–1.0)
Monocytes Relative: 9 %
NEUTROS ABS: 3.7 10*3/uL (ref 1.4–6.5)
Neutrophils Relative %: 60 %

## 2015-03-31 LAB — COMPREHENSIVE METABOLIC PANEL
ALT: 18 U/L (ref 17–63)
AST: 24 U/L (ref 15–41)
Albumin: 4.2 g/dL (ref 3.5–5.0)
Alkaline Phosphatase: 53 U/L (ref 38–126)
Anion gap: 10 (ref 5–15)
BUN: 27 mg/dL — AB (ref 6–20)
CALCIUM: 8.9 mg/dL (ref 8.9–10.3)
CO2: 26 mmol/L (ref 22–32)
Chloride: 101 mmol/L (ref 101–111)
Creatinine, Ser: 1.22 mg/dL (ref 0.61–1.24)
GFR calc Af Amer: 58 mL/min — ABNORMAL LOW (ref >60)
GFR, EST NON AFRICAN AMERICAN: 50 mL/min — AB (ref >60)
Glucose, Bld: 100 mg/dL — ABNORMAL HIGH (ref 65–99)
POTASSIUM: 3.7 mmol/L (ref 3.5–5.1)
Sodium: 137 mmol/L (ref 135–145)
TOTAL PROTEIN: 7.1 g/dL (ref 6.5–8.1)
Total Bilirubin: 0.5 mg/dL (ref 0.3–1.2)

## 2015-03-31 LAB — CBC
HCT: 42.1 % (ref 40.0–52.0)
Hemoglobin: 13.8 g/dL (ref 13.0–18.0)
MCH: 29.2 pg (ref 26.0–34.0)
MCHC: 32.8 g/dL (ref 32.0–36.0)
MCV: 88.9 fL (ref 80.0–100.0)
Platelets: 101 10*3/uL — ABNORMAL LOW (ref 150–440)
RBC: 4.74 MIL/uL (ref 4.40–5.90)
RDW: 13.9 % (ref 11.5–14.5)
WBC: 6.2 10*3/uL (ref 3.8–10.6)

## 2015-03-31 NOTE — Patient Instructions (Signed)
  Your procedure is scheduled on: Wednesday April 08, 2015 Report to Same Day Surgery. To find out your arrival time please call [phone] between 1PM - 3PM on Tuesday April 07, 2015.  Remember: Instructions that are not followed completely may result in serious medical risk, up to and including death, or upon the discretion of your surgeon and anesthesiologist your surgery may need to be rescheduled.    _x__ 1. Do not eat food or drink liquids after midnight. No gum chewing or hard candies.     ____ 2. No Alcohol for 24 hours before or after surgery.   ____ 3. Bring all medications with you on the day of surgery if instructed.    _x___ 4. Notify your doctor if there is any change in your medical condition     (cold, fever, infections).     Do not wear jewelry, make-up, hairpins, clips or nail polish.  Do not wear lotions, powders, or perfumes. You may wear deodorant.  Do not shave 48 hours prior to surgery. Men may shave face and neck.  Do not bring valuables to the hospital.    [facility] is not responsible for any belongings or valuables.               Contacts, dentures or bridgework may not be worn into surgery.  Leave your suitcase in the car. After surgery it may be brought to your room.  For patients admitted to the hospital, discharge time is determined by your treatment team.   Patients discharged the day of surgery will not be allowed to drive home.    Please read over the following fact sheets that you were given:   [facility] Preparing for Surgery  __x__ Take these medicines the morning of surgery with A SIP OF WATER:    1. metoprolol tartrate (LOPRESSOR)   2. pantoprazole (PROTONIX)  ____ Fleet Enema (as directed)   ____ Use CHG Soap as directed  ____ Use inhalers on the day of surgery  ____ Stop metformin 2 days prior to surgery    ____ Take 1/2 of usual insulin dose the night before surgery and none on the morning of surgery.   _X_ Stop aspirin on  April 03, 2015  __x__ Stop Anti-inflammatories on Celebrex, Aleve, Ibuprofen, Motrin, Advil.  Tylenol is OK to take for pain.   __x_ Stop supplements, Cranberry,Lysine, Prostate SR until after surgery.    ____ Bring C-Pap to the hospital.

## 2015-04-01 NOTE — OR Nursing (Signed)
Labs with plt ct 101 faxed to surgeon. plt ct 7/15  87

## 2015-04-08 ENCOUNTER — Ambulatory Visit
Admission: RE | Admit: 2015-04-08 | Discharge: 2015-04-08 | Disposition: A | Discharge status: Home / Self Care | Payer: Medicare Other | Source: Ambulatory Visit | Attending: Otolaryngology | Admitting: Otolaryngology

## 2015-04-08 ENCOUNTER — Encounter
Admission: RE | Disposition: A | Discharge status: Home / Self Care | Payer: Self-pay | Source: Ambulatory Visit | Attending: Otolaryngology

## 2015-04-08 ENCOUNTER — Ambulatory Visit: Payer: Medicare Other | Admitting: Anesthesiology

## 2015-04-08 ENCOUNTER — Encounter: Payer: Self-pay | Admitting: *Deleted

## 2015-04-08 DIAGNOSIS — Z8261 Family history of arthritis: Secondary | ICD-10-CM | POA: Diagnosis not present

## 2015-04-08 DIAGNOSIS — Z79899 Other long term (current) drug therapy: Secondary | ICD-10-CM | POA: Insufficient documentation

## 2015-04-08 DIAGNOSIS — I2510 Atherosclerotic heart disease of native coronary artery without angina pectoris: Secondary | ICD-10-CM | POA: Diagnosis not present

## 2015-04-08 DIAGNOSIS — Z823 Family history of stroke: Secondary | ICD-10-CM | POA: Insufficient documentation

## 2015-04-08 DIAGNOSIS — Z8249 Family history of ischemic heart disease and other diseases of the circulatory system: Secondary | ICD-10-CM | POA: Insufficient documentation

## 2015-04-08 DIAGNOSIS — R05 Cough: Secondary | ICD-10-CM | POA: Diagnosis present

## 2015-04-08 DIAGNOSIS — Z951 Presence of aortocoronary bypass graft: Secondary | ICD-10-CM | POA: Insufficient documentation

## 2015-04-08 DIAGNOSIS — Z87891 Personal history of nicotine dependence: Secondary | ICD-10-CM | POA: Insufficient documentation

## 2015-04-08 DIAGNOSIS — M1990 Unspecified osteoarthritis, unspecified site: Secondary | ICD-10-CM | POA: Insufficient documentation

## 2015-04-08 DIAGNOSIS — E785 Hyperlipidemia, unspecified: Secondary | ICD-10-CM | POA: Diagnosis not present

## 2015-04-08 DIAGNOSIS — C320 Malignant neoplasm of glottis: Secondary | ICD-10-CM | POA: Diagnosis not present

## 2015-04-08 DIAGNOSIS — Z96651 Presence of right artificial knee joint: Secondary | ICD-10-CM | POA: Insufficient documentation

## 2015-04-08 DIAGNOSIS — I10 Essential (primary) hypertension: Secondary | ICD-10-CM | POA: Insufficient documentation

## 2015-04-08 HISTORY — PX: DIRECT LARYNGOSCOPY: SHX5326

## 2015-04-08 SURGERY — LARYNGOSCOPY, DIRECT
Anesthesia: General

## 2015-04-08 MED ORDER — METHYLENE BLUE 1 % INJ SOLN
INTRAMUSCULAR | Status: AC
  Filled 2015-04-08: qty 10

## 2015-04-08 MED ORDER — PROPOFOL 10 MG/ML IV BOLUS
INTRAVENOUS | Status: DC | PRN
  Administered 2015-04-08: 120 mg via INTRAVENOUS

## 2015-04-08 MED ORDER — OXYCODONE HCL 5 MG PO TABS: 5.0000 mg | ORAL_TABLET | Freq: Once | ORAL | Status: DC | PRN

## 2015-04-08 MED ORDER — OXYMETAZOLINE HCL 0.05 % NA SOLN
NASAL | Status: DC | PRN
  Administered 2015-04-08: 1

## 2015-04-08 MED ORDER — ONDANSETRON HCL 4 MG/2ML IJ SOLN
INTRAMUSCULAR | Status: DC | PRN
  Administered 2015-04-08: 4 mg via INTRAVENOUS

## 2015-04-08 MED ORDER — LIDOCAINE-EPINEPHRINE (PF) 1 %-1:200000 IJ SOLN
INTRAMUSCULAR | Status: AC
  Filled 2015-04-08: qty 30

## 2015-04-08 MED ORDER — HYDROCODONE-ACETAMINOPHEN 7.5-325 MG/15ML PO SOLN: ORAL | Status: DC

## 2015-04-08 MED ORDER — ROCURONIUM BROMIDE 100 MG/10ML IV SOLN
INTRAVENOUS | Status: DC | PRN
  Administered 2015-04-08: 30 mg via INTRAVENOUS

## 2015-04-08 MED ORDER — PREDNISONE 10 MG (21) PO TBPK: ORAL_TABLET | ORAL | Status: DC

## 2015-04-08 MED ORDER — LACTATED RINGERS IV SOLN
INTRAVENOUS | Status: DC
  Administered 2015-04-08: 09:00:00 via INTRAVENOUS

## 2015-04-08 MED ORDER — EPHEDRINE SULFATE 50 MG/ML IJ SOLN
INTRAMUSCULAR | Status: DC | PRN
  Administered 2015-04-08: 5 mg via INTRAVENOUS

## 2015-04-08 MED ORDER — OXYMETAZOLINE HCL 0.05 % NA SOLN
NASAL | Status: AC
  Filled 2015-04-08: qty 15

## 2015-04-08 MED ORDER — OXYCODONE HCL 5 MG/5ML PO SOLN: 5.0000 mg | Freq: Once | ORAL | Status: DC | PRN

## 2015-04-08 MED ORDER — DEXAMETHASONE SODIUM PHOSPHATE 4 MG/ML IJ SOLN
INTRAMUSCULAR | Status: DC | PRN
  Administered 2015-04-08: 10 mg via INTRAVENOUS

## 2015-04-08 MED ORDER — FENTANYL CITRATE (PF) 100 MCG/2ML IJ SOLN: 25.0000 ug | INTRAMUSCULAR | Status: DC | PRN

## 2015-04-08 MED ORDER — FENTANYL CITRATE (PF) 100 MCG/2ML IJ SOLN
INTRAMUSCULAR | Status: DC | PRN
  Administered 2015-04-08: 50 ug via INTRAVENOUS
  Administered 2015-04-08: 100 ug via INTRAVENOUS

## 2015-04-08 MED ORDER — SUGAMMADEX SODIUM 200 MG/2ML IV SOLN
INTRAVENOUS | Status: DC | PRN
  Administered 2015-04-08: 160 mg via INTRAVENOUS

## 2015-04-08 SURGICAL SUPPLY — 15 items
DRESSING TELFA 4X3 1S ST N-ADH (GAUZE/BANDAGES/DRESSINGS) ×3 IMPLANT
GLOVE BIO SURGEON STRL SZ7.5 (GLOVE) ×9 IMPLANT
GOWN STRL REUS W/ TWL LRG LVL3 (GOWN DISPOSABLE) ×1 IMPLANT
GOWN STRL REUS W/TWL LRG LVL3 (GOWN DISPOSABLE) ×2
IV SET EXTENSION MINI BORE EPI (IV SETS) ×3 IMPLANT
LABEL OR SOLS (LABEL) IMPLANT
NDL ENDOSCOPIC URO 20G (NEEDLE) ×3 IMPLANT
NDL SAFETY ECLIPSE 18X1.5 (NEEDLE) ×1 IMPLANT
NEEDLE FILTER BLUNT 18X 1/2SAF (NEEDLE) ×2
NEEDLE FILTER BLUNT 18X1 1/2 (NEEDLE) ×1 IMPLANT
NEEDLE HYPO 18GX1.5 SHARP (NEEDLE) ×2
PACK HEAD/NECK (MISCELLANEOUS) ×3 IMPLANT
PATTIES SURGICAL .5 X.5 (GAUZE/BANDAGES/DRESSINGS) IMPLANT
SOL ANTI-FOG 6CC FOG-OUT (MISCELLANEOUS) ×1 IMPLANT
SOL FOG-OUT ANTI-FOG 6CC (MISCELLANEOUS) ×2

## 2015-04-08 NOTE — Anesthesia Preprocedure Evaluation (Addendum)
Anesthesia Evaluation  Patient identified by MRN, date of birth, ID band Patient awake    Reviewed: Allergy & Precautions, H&P , NPO status , Patient's Chart, lab work & pertinent test results  History of Anesthesia Complications Negative for: history of anesthetic complications  Airway Mallampati: III  TM Distance: >3 FB Neck ROM: limited    Dental  (+) Poor Dentition   Pulmonary COPDformer smoker,  breath sounds clear to auscultation  Pulmonary exam normal       Cardiovascular Exercise Tolerance: Good hypertension, + CAD Normal cardiovascular examRhythm:regular Rate:Normal     Neuro/Psych negative neurological ROS  negative psych ROS   GI/Hepatic negative GI ROS, Neg liver ROS,   Endo/Other  negative endocrine ROS  Renal/GU negative Renal ROS  negative genitourinary   Musculoskeletal   Abdominal   Peds  Hematology negative hematology ROS (+)   Anesthesia Other Findings Past Medical History:   Collagen vascular disease                                      Comment:cardiac stents and blockage   Hypertension                                                 Status post right knee replacement                           Hx of appendectomy                                           Stented coronary artery              Patient reports cardiac clearance for this procedure         60% stenosis of LAD                   Reproductive/Obstetrics negative OB ROS                            Anesthesia Physical Anesthesia Plan  ASA: III  Anesthesia Plan: General ETT   Post-op Pain Management:    Induction:   Airway Management Planned:   Additional Equipment:   Intra-op Plan:   Post-operative Plan:   Informed Consent: I have reviewed the patients History and Physical, chart, labs and discussed the procedure including the risks, benefits and alternatives for the proposed anesthesia with the  patient or authorized representative who has indicated his/her understanding and acceptance.   Dental Advisory Given  Plan Discussed with: Anesthesiologist, CRNA and Surgeon  Anesthesia Plan Comments:         Anesthesia Quick Evaluation

## 2015-04-08 NOTE — Discharge Instructions (Signed)

## 2015-04-08 NOTE — Transfer of Care (Signed)
Immediate Anesthesia Transfer of Care Note  Patient: Matthew David  Procedure(s) Performed: Procedure(s): DIRECT LARYNGOSCOPY (N/A)  Patient Location: PACU  Anesthesia Type:General  Level of Consciousness: patient cooperative and lethargic  Airway & Oxygen Therapy: Patient Spontanous Breathing and Patient connected to face mask oxygen  Post-op Assessment: Report given to RN and Post -op Vital signs reviewed and stable  Post vital signs: Reviewed and stable  Last Vitals:  Filed Vitals:   04/08/15 1114  BP: 126/60  Pulse: 80  Temp:   Resp: 16    Complications: No apparent anesthesia complications

## 2015-04-08 NOTE — Anesthesia Procedure Notes (Signed)
Procedure Name: Intubation Date/Time: 04/08/2015 10:21 AM Performed by: Jonna Clark Pre-anesthesia Checklist: Patient identified, Patient being monitored, Timeout performed, Emergency Drugs available and Suction available Patient Re-evaluated:Patient Re-evaluated prior to inductionOxygen Delivery Method: Circle system utilized Preoxygenation: Pre-oxygenation with 100% oxygen Intubation Type: IV induction Ventilation: Mask ventilation without difficulty Laryngoscope Size: Mac and 3 Grade View: Grade I Tube type: Oral Tube size: 6.5 mm Number of attempts: 1 Airway Equipment and Method: Stylet Placement Confirmation: ETT inserted through vocal cords under direct vision,  positive ETCO2 and breath sounds checked- equal and bilateral Secured at: 21 cm Tube secured with: Tape Dental Injury: Teeth and Oropharynx as per pre-operative assessment and Injury to lip  Comments: Small nick to left upper lip.

## 2015-04-08 NOTE — Anesthesia Postprocedure Evaluation (Signed)
  Anesthesia Post-op Note  Patient: Matthew David  Procedure(s) Performed: Procedure(s): DIRECT LARYNGOSCOPY (N/A)  Anesthesia type:General ETT  Patient location: PACU  Post pain: Pain level controlled  Post assessment: Post-op Vital signs reviewed, Patient's Cardiovascular Status Stable, Respiratory Function Stable, Patent Airway and No signs of Nausea or vomiting  Post vital signs: Reviewed and stable  Last Vitals:  Filed Vitals:   04/08/15 1231  BP: 130/51  Pulse: 62  Temp:   Resp: 16    Level of consciousness: awake, alert  and patient cooperative  Complications: No apparent anesthesia complications

## 2015-04-08 NOTE — Op Note (Signed)
04/08/2015  10:55 AM    Matthew David  [id number]    Pre-Op Diagnosis:  Right true vocal cord lesion  Post-op Diagnosis: Same  Procedure:  Microlaryngoscopy with Biopsy of right true vocal cord lesion  Surgeon:  Riley Nearing  Anesthesia:  General Endotracheal  EBL:  Minimal  Complications:  None  Findings:  Granular, invasive appearing lesion of the right true vocal cord extending as much as 5 mm below the free edge of the cord. This involved the anterior two thirds of the right vocal cord extending to the anterior commissure.  Procedure: With the patient in a comfortable supine position, general endotracheal anesthesia was induced without difficulty.  At an appropriate level, the table was turned 90 degrees away from Anesthesia.  A clean preparation and draping was performed in the standard fashion. The oropharynx, oral cavity, nasopharynx and hypopharynx were palpated with findings as described above.  A tooth guard was placed. Using the Dedo laryngoscope, the oropharynx, hypopharynx and larynx were carefully inspected. The lesion on the right cord was noted. The patient was placed into suspension with the Lewy arm. Photodocumentation of the lesion was obtained with the 0 telescope. The microscope was then moved into place and used for better visualization. Multiple biopsies of the right cord lesion were obtained with cup forceps and scissors. The specimen was sent for pathology. Afrin moistened pledgets were used to control minor bleeding.  The findings were as described above.  The laryngoscope was removed.  At this point the procedure was completed.  Dental status was intact.  The patient was returned to Anesthesia, awakened, extubated, and transferred to PACU in satisfactory condition.   Disposition: To PACU, then discharge home  Plan: Soft, bland diet, advance as tolerated. Take pain medications as prescribed. Follow-up in 3 weeks.  Riley Nearing 04/08/2015 10:55 AM

## 2015-04-08 NOTE — H&P (Signed)
History and physical reviewed and will be scanned in later. Recent cough without fever that persists, but on antibiotics and lungs sound clear, appears stable for surgery. All questions regarding the procedure answered, and patient (or family if a child) expressed understanding of the procedure.  Matthew David S @TODAY @

## 2015-04-09 LAB — SURGICAL PATHOLOGY

## 2015-04-17 ENCOUNTER — Ambulatory Visit: Admission: RE | Admit: 2015-04-17 | Payer: Medicare Other | Source: Ambulatory Visit | Admitting: Radiation Oncology

## 2015-04-20 ENCOUNTER — Ambulatory Visit
Admission: RE | Admit: 2015-04-20 | Discharge: 2015-04-20 | Disposition: A | Discharge status: Home / Self Care | Payer: Medicare Other | Source: Ambulatory Visit | Attending: Radiation Oncology | Admitting: Radiation Oncology

## 2015-04-20 ENCOUNTER — Other Ambulatory Visit: Payer: Self-pay | Admitting: *Deleted

## 2015-04-20 ENCOUNTER — Encounter: Payer: Self-pay | Admitting: Radiation Oncology

## 2015-04-20 VITALS — BP 122/62 | HR 61 | Temp 97.3°F | Resp 20 | Wt 188.5 lb

## 2015-04-20 DIAGNOSIS — C329 Malignant neoplasm of larynx, unspecified: Secondary | ICD-10-CM

## 2015-04-20 DIAGNOSIS — Z510 Encounter for antineoplastic radiation therapy: Secondary | ICD-10-CM | POA: Insufficient documentation

## 2015-04-20 DIAGNOSIS — C320 Malignant neoplasm of glottis: Secondary | ICD-10-CM

## 2015-04-20 NOTE — Consult Note (Signed)
Except an outstanding is perfect of Radiation Oncology NEW PATIENT EVALUATION  Name: Matthew David  MRN: [id number]  Date:   04/20/2015     DOB: 07-25-1922   This 81 y.o. 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  REFERRING PHYSICIAN: Adin Hector, MD  CHIEF COMPLAINT:  Chief Complaint  Patient presents with  . Cancer    Pt is here for initial assessment of vocal cord cancer.      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   PREVIOUS INVESTIGATIONS:  CT scan of the chest reviewed, CT scan with contrast of the neck ordered Surgical pathology report reviewed Clinical notes reviewed  HPI: Patient is a 81 year old male had some increased hoarseness and was seen by ENT found to have a mass on the right true vocal cord. Patient underwent microlaryngoscopy showing granular invasive appearing lesion of the right true cord extending is much is 5 mm below the free edge of the cord and involving the anterior two thirds of the right vocal cord extending to the anterior commissure. Patient is otherwise in excellent health for his age has had some fever and cough no hemoptysis. He is having no head and neck pain or dysphagia. 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 Patient is seen today for consultation regarding radiation therapy.  PLANNED TREATMENT REGIMEN: Laryngeal radiation therapy  PAST MEDICAL HISTORY:  has a past medical history of Collagen vascular disease; Hypertension; Status post right knee replacement; appendectomy; and Stented coronary artery.    PAST SURGICAL HISTORY:  Past Surgical History  Procedure Laterality Date  . Appendectomy  1944  . Coronary angioplasty with stent placement  2012  . Tonsillectomy  1952  . Shoulder surgery Left 2006  . Joint replacement Right 2008    knee  . Knee arthroscopy Right 2006  .  Cataract extraction w/ intraocular lens  implant, bilateral Bilateral 2011    both eye done in the same year  . Direct laryngoscopy N/A 04/08/2015    Procedure: DIRECT LARYNGOSCOPY;  Surgeon: Clyde Canterbury, MD;  Location: [facility] ORS;  Service: ENT;  Laterality: N/A;    FAMILY HISTORY: family history is not on file.  SOCIAL HISTORY:  reports that he quit smoking about 31 years ago. His smoking use included Cigars and Pipe. He does not have any smokeless tobacco history on file. He reports that he does not drink alcohol or use illicit drugs.  ALLERGIES: Gabapentin  MEDICATIONS:  Current Outpatient Prescriptions  Medication Sig Dispense Refill  . Alum & Mag Hydroxide-Simeth (MAGIC MOUTHWASH) SOLN Take 10 mLs by mouth 4 (four) times daily as needed for mouth pain.    [name] aspirin 81 MG tablet Take 81 mg by mouth daily after breakfast.    . azelastine (ASTELIN) 0.1 % nasal spray Place 1 spray into both nostrils 2 (two) times daily. Use in each nostril as directed    . celecoxib (CELEBREX) 200 MG capsule Take 200 mg by mouth every morning.    . cetirizine (ZYRTEC) 10 MG tablet Take 10 mg by mouth daily.    [name] CRANBERRY CONCENTRATE PO Take 1 tablet by mouth 2 (two) times daily.    . fluticasone (FLONASE) 50 MCG/ACT nasal spray Place 2 sprays into both nostrils daily.    [name] LORazepam (ATIVAN) 1 MG tablet Take 1 mg by mouth at bedtime.    . lovastatin (MEVACOR) 20 MG tablet  Take 20 mg by mouth at bedtime.    [name] Lysine 1000 MG TABS Take 1 tablet by mouth every morning.    . metoprolol tartrate (LOPRESSOR) 25 MG tablet Take 12.5 mg by mouth 2 (two) times daily.    . mirtazapine (REMERON) 15 MG tablet Take 15 mg by mouth at bedtime as needed (0.5 tablet at bedtime for sleep).    . multivitamin-iron-minerals-folic acid (CENTRUM) chewable tablet Chew 1 tablet by mouth every morning.    . Probiotic Product (ALIGN PO) Take 1 capsule by mouth every morning.    Clarnce Flock Palmetto-Phytosterols (PROSTATE SR PO) Take 1  tablet by mouth 2 (two) times daily.    . tamsulosin (FLOMAX) 0.4 MG CAPS capsule Take 0.4 mg by mouth 2 (two) times daily.    . valsartan-hydrochlorothiazide (DIOVAN-HCT) 80-12.5 MG per tablet Take 1 tablet by mouth every morning. 0.5 tablet in am    . HYDROcodone-acetaminophen (HYCET) 7.5-325 mg/15 ml solution 10 mL's every 4-6 hours as needed for pain (Patient not taking: Reported on 04/20/2015) 200 mL 0  . pantoprazole (PROTONIX) 40 MG tablet Take 40 mg by mouth 2 (two) times daily.    . predniSONE (STERAPRED UNI-PAK 21 TAB) 10 MG (21) TBPK tablet Sterapred DS 6 days taper. Take as directed. (Patient not taking: Reported on 04/20/2015) 21 tablet 0   No current facility-administered medications for this encounter.    ECOG PERFORMANCE STATUS:  0 - Asymptomatic  REVIEW OF SYSTEMS:  Patient denies any weight loss, fatigue, weakness, fever, chills or night sweats. Patient denies any loss of vision, blurred vision. Patient denies any ringing  of the ears or hearing loss. No irregular heartbeat. Patient denies heart murmur or history of fainting. Patient denies any chest pain or pain radiating to her upper extremities. Patient denies any shortness of breath, difficulty breathing at night, cough or hemoptysis. Patient denies any swelling in the lower legs. Patient denies any nausea vomiting, vomiting of blood, or coffee ground material in the vomitus. Patient denies any stomach pain. Patient states has had normal bowel movements no significant constipation or diarrhea. Patient denies any dysuria, hematuria or significant nocturia. Patient denies any problems walking, swelling in the joints or loss of balance. Patient denies any skin changes, loss of hair or loss of weight. Patient denies any excessive worrying or anxiety or significant depression. Patient denies any problems with insomnia. Patient denies excessive thirst, polyuria, polydipsia. Patient denies any swollen glands, patient denies easy bruising or  easy bleeding. Patient denies any recent infections, allergies or URI. Patient "s visual fields have not changed significantly in recent time.    PHYSICAL EXAM: BP 122/62 mmHg  Pulse 61  Temp(Src) 97.3 F (36.3 C)  Resp 20  Wt 188 lb 7.9 oz (85.5 kg) Oral cavity is clear no oral mucosal lesions are identified. Indirect mirror examination shows some shagginess of the right true cord seems to be some slight decreased mobility. Neck is clear without evidence of subject gastric cervical or supraclavicular adenopathy. Well-developed well-nourished patient in NAD. HEENT reveals PERLA, EOMI, discs not visualized.  Oral cavity is clear. No oral mucosal lesions are identified. Neck is clear without evidence of cervical or supraclavicular adenopathy. Lungs are clear to A&P. Cardiac examination is essentially unremarkable with regular rate and rhythm without murmur rub or thrill. Abdomen is benign with no organomegaly or masses noted. Motor sensory and DTR levels are equal and symmetric in the upper and lower extremities. Cranial nerves II through XII are grossly  intact. Proprioception is intact. No peripheral adenopathy or edema is identified. No motor or sensory levels are noted. Crude visual fields are within normal range.   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 I would plan on delivering approximately 6600 cGy to his larynx using radiation therapy. I have set up and ordered CT simulation in about a week and a half to allow time for his CT scan to be reviewed. Risks and benefits of treatment including increased hoarseness, fatigue, skin reaction, edema of the  larynx all were described in detail to the patient. He seems to comprehend my treatment plan well. Will make further recommendations after review of his CT scan.  I would like to take this opportunity for allowing me to participate in the care of your patient.[name]., MD

## 2015-04-22 ENCOUNTER — Ambulatory Visit: Payer: Medicare Other

## 2015-04-23 ENCOUNTER — Ambulatory Visit
Admission: RE | Admit: 2015-04-23 | Discharge: 2015-04-23 | Disposition: A | Discharge status: Home / Self Care | Payer: Medicare Other | Source: Ambulatory Visit | Attending: Radiation Oncology | Admitting: Radiation Oncology

## 2015-04-23 DIAGNOSIS — C329 Malignant neoplasm of larynx, unspecified: Secondary | ICD-10-CM | POA: Insufficient documentation

## 2015-04-23 HISTORY — DX: Malignant (primary) neoplasm, unspecified: C80.1

## 2015-04-23 MED ORDER — IOHEXOL 300 MG/ML  SOLN
75.0000 mL | Freq: Once | INTRAMUSCULAR | Status: AC | PRN
  Administered 2015-04-23: 75 mL via INTRAVENOUS

## 2015-04-29 ENCOUNTER — Ambulatory Visit
Admission: RE | Admit: 2015-04-29 | Discharge: 2015-04-29 | Disposition: A | Discharge status: Home / Self Care | Payer: Medicare Other | Source: Ambulatory Visit | Attending: Radiation Oncology | Admitting: Radiation Oncology

## 2015-04-29 DIAGNOSIS — Z510 Encounter for antineoplastic radiation therapy: Secondary | ICD-10-CM | POA: Diagnosis not present

## 2015-04-29 DIAGNOSIS — C329 Malignant neoplasm of larynx, unspecified: Secondary | ICD-10-CM | POA: Diagnosis not present

## 2015-04-30 ENCOUNTER — Other Ambulatory Visit: Payer: Self-pay | Admitting: *Deleted

## 2015-04-30 DIAGNOSIS — C320 Malignant neoplasm of glottis: Secondary | ICD-10-CM

## 2015-05-04 DIAGNOSIS — Z510 Encounter for antineoplastic radiation therapy: Secondary | ICD-10-CM | POA: Diagnosis not present

## 2015-05-06 ENCOUNTER — Ambulatory Visit
Admission: RE | Admit: 2015-05-06 | Discharge: 2015-05-06 | Disposition: A | Discharge status: Home / Self Care | Payer: Medicare Other | Source: Ambulatory Visit | Attending: Radiation Oncology | Admitting: Radiation Oncology

## 2015-05-06 DIAGNOSIS — Z510 Encounter for antineoplastic radiation therapy: Secondary | ICD-10-CM | POA: Diagnosis not present

## 2015-05-07 ENCOUNTER — Ambulatory Visit
Admission: RE | Admit: 2015-05-07 | Discharge: 2015-05-07 | Disposition: A | Discharge status: Home / Self Care | Payer: Medicare Other | Source: Ambulatory Visit | Attending: Radiation Oncology | Admitting: Radiation Oncology

## 2015-05-07 DIAGNOSIS — Z510 Encounter for antineoplastic radiation therapy: Secondary | ICD-10-CM | POA: Diagnosis not present

## 2015-05-08 ENCOUNTER — Ambulatory Visit
Admission: RE | Admit: 2015-05-08 | Discharge: 2015-05-08 | Disposition: A | Discharge status: Home / Self Care | Payer: Medicare Other | Source: Ambulatory Visit | Attending: Radiation Oncology | Admitting: Radiation Oncology

## 2015-05-08 DIAGNOSIS — Z510 Encounter for antineoplastic radiation therapy: Secondary | ICD-10-CM | POA: Diagnosis not present

## 2015-05-11 ENCOUNTER — Ambulatory Visit
Admission: RE | Admit: 2015-05-11 | Discharge: 2015-05-11 | Disposition: A | Discharge status: Home / Self Care | Payer: Medicare Other | Source: Ambulatory Visit | Attending: Radiation Oncology | Admitting: Radiation Oncology

## 2015-05-11 DIAGNOSIS — Z510 Encounter for antineoplastic radiation therapy: Secondary | ICD-10-CM | POA: Diagnosis not present

## 2015-05-12 ENCOUNTER — Ambulatory Visit
Admission: RE | Admit: 2015-05-12 | Discharge: 2015-05-12 | Disposition: A | Discharge status: Home / Self Care | Payer: Medicare Other | Source: Ambulatory Visit | Attending: Radiation Oncology | Admitting: Radiation Oncology

## 2015-05-12 DIAGNOSIS — Z510 Encounter for antineoplastic radiation therapy: Secondary | ICD-10-CM | POA: Diagnosis not present

## 2015-05-13 ENCOUNTER — Ambulatory Visit
Admission: RE | Admit: 2015-05-13 | Discharge: 2015-05-13 | Disposition: A | Discharge status: Home / Self Care | Payer: Medicare Other | Source: Ambulatory Visit | Attending: Radiation Oncology | Admitting: Radiation Oncology

## 2015-05-13 DIAGNOSIS — Z510 Encounter for antineoplastic radiation therapy: Secondary | ICD-10-CM | POA: Diagnosis not present

## 2015-05-14 ENCOUNTER — Emergency Department: Payer: Medicare Other

## 2015-05-14 ENCOUNTER — Inpatient Hospital Stay: Payer: Medicare Other | Attending: Radiation Oncology

## 2015-05-14 ENCOUNTER — Other Ambulatory Visit: Payer: Self-pay

## 2015-05-14 ENCOUNTER — Ambulatory Visit
Admission: RE | Admit: 2015-05-14 | Discharge: 2015-05-14 | Disposition: A | Discharge status: Home / Self Care | Payer: Medicare Other | Source: Ambulatory Visit | Attending: Radiation Oncology | Admitting: Radiation Oncology

## 2015-05-14 ENCOUNTER — Emergency Department
Admission: EM | Admit: 2015-05-14 | Discharge: 2015-05-14 | Disposition: A | Discharge status: Home / Self Care | Payer: Medicare Other | Attending: Emergency Medicine | Admitting: Emergency Medicine

## 2015-05-14 DIAGNOSIS — Z7982 Long term (current) use of aspirin: Secondary | ICD-10-CM | POA: Insufficient documentation

## 2015-05-14 DIAGNOSIS — Z791 Long term (current) use of non-steroidal anti-inflammatories (NSAID): Secondary | ICD-10-CM | POA: Insufficient documentation

## 2015-05-14 DIAGNOSIS — S0990XA Unspecified injury of head, initial encounter: Secondary | ICD-10-CM | POA: Insufficient documentation

## 2015-05-14 DIAGNOSIS — S0993XA Unspecified injury of face, initial encounter: Secondary | ICD-10-CM | POA: Diagnosis present

## 2015-05-14 DIAGNOSIS — S01112A Laceration without foreign body of left eyelid and periocular area, initial encounter: Secondary | ICD-10-CM | POA: Diagnosis not present

## 2015-05-14 DIAGNOSIS — I10 Essential (primary) hypertension: Secondary | ICD-10-CM | POA: Insufficient documentation

## 2015-05-14 DIAGNOSIS — Y9301 Activity, walking, marching and hiking: Secondary | ICD-10-CM | POA: Insufficient documentation

## 2015-05-14 DIAGNOSIS — W19XXXA Unspecified fall, initial encounter: Secondary | ICD-10-CM

## 2015-05-14 DIAGNOSIS — W01198A Fall on same level from slipping, tripping and stumbling with subsequent striking against other object, initial encounter: Secondary | ICD-10-CM | POA: Insufficient documentation

## 2015-05-14 DIAGNOSIS — Z79899 Other long term (current) drug therapy: Secondary | ICD-10-CM | POA: Diagnosis not present

## 2015-05-14 DIAGNOSIS — Y9289 Other specified places as the place of occurrence of the external cause: Secondary | ICD-10-CM | POA: Insufficient documentation

## 2015-05-14 DIAGNOSIS — C320 Malignant neoplasm of glottis: Secondary | ICD-10-CM

## 2015-05-14 DIAGNOSIS — S61412A Laceration without foreign body of left hand, initial encounter: Secondary | ICD-10-CM | POA: Diagnosis not present

## 2015-05-14 DIAGNOSIS — Y998 Other external cause status: Secondary | ICD-10-CM | POA: Insufficient documentation

## 2015-05-14 DIAGNOSIS — Z510 Encounter for antineoplastic radiation therapy: Secondary | ICD-10-CM | POA: Diagnosis not present

## 2015-05-14 DIAGNOSIS — C329 Malignant neoplasm of larynx, unspecified: Secondary | ICD-10-CM | POA: Diagnosis not present

## 2015-05-14 DIAGNOSIS — Z87891 Personal history of nicotine dependence: Secondary | ICD-10-CM | POA: Insufficient documentation

## 2015-05-14 LAB — CBC
HCT: 42.9 % (ref 40.0–52.0)
Hemoglobin: 14.2 g/dL (ref 13.0–18.0)
MCH: 29.2 pg (ref 26.0–34.0)
MCHC: 33.1 g/dL (ref 32.0–36.0)
MCV: 88.3 fL (ref 80.0–100.0)
Platelets: 120 10*3/uL — ABNORMAL LOW (ref 150–440)
RBC: 4.86 MIL/uL (ref 4.40–5.90)
RDW: 14.4 % (ref 11.5–14.5)
WBC: 7.7 10*3/uL (ref 3.8–10.6)

## 2015-05-14 NOTE — ED Provider Notes (Signed)
[facility] Emergency Department Provider Note  Time seen: 11:34 AM  I have reviewed the triage vital signs and the nursing notes.   HISTORY  Chief Complaint Fall    HPI Matthew David is a 81 y.o. male with a past medical history of hypertension, laryngeal cancer currently undergoing radiation therapy, who presents the emergency department after a fall. According to the patient and report he was getting radiation therapy, after his appointment he was getting ready to leave when he slipped and fell hitting his head on the ground. Patient was brought to the emergency department for evaluation. Denies any headache, weakness or numbness, vomiting, or loss of consciousness. Patient appears very well currently with no complaints. Patient did suffer a small skin tear to his left hand, and a small laceration to his left eyebrow.Takes a daily aspirin but denies any other blood thinners.     Past Medical History  Diagnosis Date  . Collagen vascular disease     cardiac stents and blockage  . Hypertension   . Status post right knee replacement   . Hx of appendectomy   . Stented coronary artery   . Cancer     Larynx Cancer 03/2015    There are no active problems to display for this patient.   Past Surgical History  Procedure Laterality Date  . Appendectomy  1944  . Coronary angioplasty with stent placement  2012  . Tonsillectomy  1952  . Shoulder surgery Left 2006  . Joint replacement Right 2008    knee  . Knee arthroscopy Right 2006  . Cataract extraction w/ intraocular lens  implant, bilateral Bilateral 2011    both eye done in the same year  . Direct laryngoscopy N/A 04/08/2015    Procedure: DIRECT LARYNGOSCOPY;  Surgeon: Clyde Canterbury, MD;  Location: [facility] ORS;  Service: ENT;  Laterality: N/A;    Current Outpatient Rx  Name  Route  Sig  Dispense  Refill  . Alum & Mag Hydroxide-Simeth (MAGIC MOUTHWASH) SOLN   Oral   Take 10 mLs by mouth 4 (four) times  daily as needed for mouth pain.         [name] aspirin 81 MG tablet   Oral   Take 81 mg by mouth daily after breakfast.         . azelastine (ASTELIN) 0.1 % nasal spray   Each Nare   Place 1 spray into both nostrils 2 (two) times daily. Use in each nostril as directed         . celecoxib (CELEBREX) 200 MG capsule   Oral   Take 200 mg by mouth every morning.         . cetirizine (ZYRTEC) 10 MG tablet   Oral   Take 10 mg by mouth daily.         [name] CRANBERRY CONCENTRATE PO   Oral   Take 1 tablet by mouth 2 (two) times daily.         . fluticasone (FLONASE) 50 MCG/ACT nasal spray   Each Nare   Place 2 sprays into both nostrils daily.         [name] HYDROcodone-acetaminophen (HYCET) 7.5-325 mg/15 ml solution      10 mL's every 4-6 hours as needed for pain Patient not taking: Reported on 04/20/2015   200 mL   0   . LORazepam (ATIVAN) 1 MG tablet   Oral   Take 1 mg by mouth at bedtime.         [name]  lovastatin (MEVACOR) 20 MG tablet   Oral   Take 20 mg by mouth at bedtime.         [name] Lysine 1000 MG TABS   Oral   Take 1 tablet by mouth every morning.         . metoprolol tartrate (LOPRESSOR) 25 MG tablet   Oral   Take 12.5 mg by mouth 2 (two) times daily.         . mirtazapine (REMERON) 15 MG tablet   Oral   Take 15 mg by mouth at bedtime as needed (0.5 tablet at bedtime for sleep).         . multivitamin-iron-minerals-folic acid (CENTRUM) chewable tablet   Oral   Chew 1 tablet by mouth every morning.         . pantoprazole (PROTONIX) 40 MG tablet   Oral   Take 40 mg by mouth 2 (two) times daily.         . predniSONE (STERAPRED UNI-PAK 21 TAB) 10 MG (21) TBPK tablet      Sterapred DS 6 days taper. Take as directed. Patient not taking: Reported on 04/20/2015   21 tablet   0   . Probiotic Product (ALIGN PO)   Oral   Take 1 capsule by mouth every morning.         Clarnce Flock Palmetto-Phytosterols (PROSTATE SR PO)   Oral   Take 1 tablet by mouth 2  (two) times daily.         . tamsulosin (FLOMAX) 0.4 MG CAPS capsule   Oral   Take 0.4 mg by mouth 2 (two) times daily.         . valsartan-hydrochlorothiazide (DIOVAN-HCT) 80-12.5 MG per tablet   Oral   Take 1 tablet by mouth every morning. 0.5 tablet in am           Allergies Gabapentin  History reviewed. No pertinent family history.  Social History Social History  Substance Use Topics  . Smoking status: Former Smoker    Types: Cigars, Pipe    Quit date: 03/30/1984  . Smokeless tobacco: None  . Alcohol Use: No    Review of Systems Constitutional: Negative for fever. Negative for LOC. Cardiovascular: Negative for chest pain. Respiratory: Negative for shortness of breath. Gastrointestinal: Negative for abdominal pain Musculoskeletal: Negative for back pain. Negative for neck pain. Skin: Skin tear to left hand, cut to left eyebrow. Neurological: Negative for headaches, focal weakness or numbness. 10-point ROS otherwise negative.  ____________________________________________   PHYSICAL EXAM:  VITAL SIGNS: ED Triage Vitals  Enc Vitals Group     BP 05/14/15 1116 144/60 mmHg     Pulse Rate 05/14/15 1116 55     Resp 05/14/15 1116 16     Temp 05/14/15 1116 97.6 F (36.4 C)     Temp Source 05/14/15 1116 Oral     SpO2 05/14/15 1116 99 %     Weight 05/14/15 1116 190 lb (86.183 kg)     Height 05/14/15 1116 5\' 8"  (1.727 m)     Head Cir --      Peak Flow --      Pain Score --      Pain Loc --      Pain Edu? --      Excl. in [place]? --     Constitutional: Alert and oriented. Well appearing and in no distress. Eyes: Normal exam, 2 mm PERRL. ENT   Head: Normocephalic. 1.5 cm laceration to his left  eyebrow, hemostatic. Otherwise atraumatic exam.   Nose:  No hematoma.   Mouth/Throat: Mucous membranes are moist. No oral injuries. Cardiovascular: Normal rate, regular rhythm.  Respiratory: Normal respiratory effort without tachypnea nor retractions. Breath  sounds are clear and equal bilaterally. No wheezes/rales/rhonchi. Gastrointestinal: Soft and nontender. No distention.   Musculoskeletal: Nontender with normal range of motion in all extremities. No lower extremity tenderness or edema. Pelvis is stable, no tenderness with range of motion of all extremity joints. Neck is nontender. Neurologic:  Normal speech and language. No gross focal neurologic deficits Skin:  1.5 similar laceration to left eyebrow, 1 cm skin tear to dorsal aspect of left hand. Hemostatic. Psychiatric: Mood and affect are normal. Speech and behavior are normal. Patient exhibits appropriate insight and judgment.  ____________________________________________    EKG  EKG reviewed and interpreted by myself shows sinus bradycardia at 55 bpm, narrow QRS, normal axis, normal intervals, no ST changes noted.  ____________________________________________    RADIOLOGY  CT shows no acute abnormality.  ____________________________________________   INITIAL IMPRESSION / ASSESSMENT AND PLAN / ED COURSE  Pertinent labs & imaging results that were available during my care of the patient were reviewed by me and considered in my medical decision making (see chart for details).  Patient with fall which appears to be mechanical following radiation treatment. Patient appears very well currently, no complaints. We will bandage his skin tear, and placed Steri-Strips on the left eyebrow laceration. The laceration is hemostatic, well approximated, does not gape, does not require suturing. We will obtain a head CT given fall with head impact and the patient is on aspirin anticoagulation. Patient agreeable to plan.  CT shows no acute abnormality. We will discharge patient home with primary care follow-up. Patient agreeable to plan.  ____________________________________________   FINAL CLINICAL IMPRESSION(S) / ED DIAGNOSES  Cheral Marker, MD 05/14/15 1218

## 2015-05-14 NOTE — ED Notes (Signed)
Left eye laceration cleaned with sterile normal saline and 5 steri strips applied per MD request.  Left hand skin tear cleansed with sterile normal saline and band aid applied.  Patient tolerated procedure without complication.

## 2015-05-14 NOTE — Discharge Instructions (Signed)

## 2015-05-14 NOTE — ED Notes (Signed)
Patient brought to ER on stretcher from cancer center post fall.  Patient had completed radiation treatment for larynx cancer when he was walking to go home and slipped and fell.  Patient denies dizziness, light headiness, or loss of conscious. Patient is alert and oriented. Patient sustained injury to left hand that is covered with guaze and bleeding is controlled.  Also sustained left orbit laceration that is covered with guaze and non-bleeding.

## 2015-05-15 ENCOUNTER — Ambulatory Visit
Admission: RE | Admit: 2015-05-15 | Discharge: 2015-05-15 | Disposition: A | Discharge status: Home / Self Care | Payer: Medicare Other | Source: Ambulatory Visit | Attending: Radiation Oncology | Admitting: Radiation Oncology

## 2015-05-15 DIAGNOSIS — Z510 Encounter for antineoplastic radiation therapy: Secondary | ICD-10-CM | POA: Diagnosis not present

## 2015-05-18 ENCOUNTER — Ambulatory Visit
Admission: RE | Admit: 2015-05-18 | Discharge: 2015-05-18 | Disposition: A | Discharge status: Home / Self Care | Payer: Medicare Other | Source: Ambulatory Visit | Attending: Radiation Oncology | Admitting: Radiation Oncology

## 2015-05-18 DIAGNOSIS — Z510 Encounter for antineoplastic radiation therapy: Secondary | ICD-10-CM | POA: Diagnosis not present

## 2015-05-19 ENCOUNTER — Ambulatory Visit
Admission: RE | Admit: 2015-05-19 | Discharge: 2015-05-19 | Disposition: A | Discharge status: Home / Self Care | Payer: Medicare Other | Source: Ambulatory Visit | Attending: Radiation Oncology | Admitting: Radiation Oncology

## 2015-05-19 DIAGNOSIS — Z510 Encounter for antineoplastic radiation therapy: Secondary | ICD-10-CM | POA: Diagnosis not present

## 2015-05-20 ENCOUNTER — Ambulatory Visit
Admission: RE | Admit: 2015-05-20 | Discharge: 2015-05-20 | Disposition: A | Discharge status: Home / Self Care | Payer: Medicare Other | Source: Ambulatory Visit | Attending: Radiation Oncology | Admitting: Radiation Oncology

## 2015-05-20 DIAGNOSIS — Z510 Encounter for antineoplastic radiation therapy: Secondary | ICD-10-CM | POA: Diagnosis not present

## 2015-05-21 ENCOUNTER — Inpatient Hospital Stay: Payer: Medicare Other

## 2015-05-21 ENCOUNTER — Ambulatory Visit: Payer: Medicare Other

## 2015-05-21 ENCOUNTER — Ambulatory Visit
Admission: RE | Admit: 2015-05-21 | Discharge: 2015-05-21 | Disposition: A | Discharge status: Home / Self Care | Payer: Medicare Other | Source: Ambulatory Visit | Attending: Radiation Oncology | Admitting: Radiation Oncology

## 2015-05-22 ENCOUNTER — Ambulatory Visit
Admission: RE | Admit: 2015-05-22 | Discharge: 2015-05-22 | Disposition: A | Discharge status: Home / Self Care | Payer: Medicare Other | Source: Ambulatory Visit | Attending: Radiation Oncology | Admitting: Radiation Oncology

## 2015-05-22 ENCOUNTER — Ambulatory Visit: Payer: Medicare Other

## 2015-05-25 ENCOUNTER — Ambulatory Visit
Admission: RE | Admit: 2015-05-25 | Discharge: 2015-05-25 | Disposition: A | Discharge status: Home / Self Care | Payer: Medicare Other | Source: Ambulatory Visit | Attending: Radiation Oncology | Admitting: Radiation Oncology

## 2015-05-25 DIAGNOSIS — Z510 Encounter for antineoplastic radiation therapy: Secondary | ICD-10-CM | POA: Diagnosis not present

## 2015-05-25 DIAGNOSIS — C329 Malignant neoplasm of larynx, unspecified: Secondary | ICD-10-CM | POA: Diagnosis not present

## 2015-05-26 ENCOUNTER — Other Ambulatory Visit: Payer: Self-pay | Admitting: *Deleted

## 2015-05-26 ENCOUNTER — Ambulatory Visit
Admission: RE | Admit: 2015-05-26 | Discharge: 2015-05-26 | Disposition: A | Discharge status: Home / Self Care | Payer: Medicare Other | Source: Ambulatory Visit | Attending: Radiation Oncology | Admitting: Radiation Oncology

## 2015-05-26 DIAGNOSIS — Z510 Encounter for antineoplastic radiation therapy: Secondary | ICD-10-CM | POA: Diagnosis not present

## 2015-05-26 MED ORDER — SUCRALFATE 1 G PO TABS: 1.0000 g | ORAL_TABLET | Freq: Three times a day (TID) | ORAL | Status: DC

## 2015-05-27 ENCOUNTER — Ambulatory Visit
Admission: RE | Admit: 2015-05-27 | Discharge: 2015-05-27 | Disposition: A | Discharge status: Home / Self Care | Payer: Medicare Other | Source: Ambulatory Visit | Attending: Radiation Oncology | Admitting: Radiation Oncology

## 2015-05-27 DIAGNOSIS — Z510 Encounter for antineoplastic radiation therapy: Secondary | ICD-10-CM | POA: Diagnosis not present

## 2015-05-28 ENCOUNTER — Telehealth: Payer: Self-pay | Admitting: *Deleted

## 2015-05-28 ENCOUNTER — Other Ambulatory Visit: Payer: Self-pay | Admitting: *Deleted

## 2015-05-28 ENCOUNTER — Inpatient Hospital Stay: Payer: Medicare Other | Attending: Radiation Oncology

## 2015-05-28 ENCOUNTER — Ambulatory Visit
Admission: RE | Admit: 2015-05-28 | Discharge: 2015-05-28 | Disposition: A | Discharge status: Home / Self Care | Payer: Medicare Other | Source: Ambulatory Visit | Attending: Radiation Oncology | Admitting: Radiation Oncology

## 2015-05-28 DIAGNOSIS — C329 Malignant neoplasm of larynx, unspecified: Secondary | ICD-10-CM | POA: Diagnosis present

## 2015-05-28 DIAGNOSIS — C320 Malignant neoplasm of glottis: Secondary | ICD-10-CM

## 2015-05-28 DIAGNOSIS — Z510 Encounter for antineoplastic radiation therapy: Secondary | ICD-10-CM | POA: Diagnosis not present

## 2015-05-28 LAB — CBC
HCT: 41.4 % (ref 40.0–52.0)
Hemoglobin: 13.8 g/dL (ref 13.0–18.0)
MCH: 29.2 pg (ref 26.0–34.0)
MCHC: 33.4 g/dL (ref 32.0–36.0)
MCV: 87.3 fL (ref 80.0–100.0)
PLATELETS: 94 10*3/uL — AB (ref 150–440)
RBC: 4.75 MIL/uL (ref 4.40–5.90)
RDW: 14.3 % (ref 11.5–14.5)
WBC: 4.5 10*3/uL (ref 3.8–10.6)

## 2015-05-28 NOTE — Telephone Encounter (Signed)
Per Dr. Baruch Gouty, Mr. Matthew David is placed on break from radiation treatments until Wednesday 06/03/15.  He is to get lab work drawn that day prior to starting radiation.

## 2015-05-29 ENCOUNTER — Ambulatory Visit
Admission: RE | Admit: 2015-05-29 | Discharge: 2015-05-29 | Disposition: A | Discharge status: Home / Self Care | Payer: Medicare Other | Source: Ambulatory Visit | Attending: Radiation Oncology | Admitting: Radiation Oncology

## 2015-05-29 ENCOUNTER — Ambulatory Visit: Payer: Medicare Other

## 2015-06-01 ENCOUNTER — Ambulatory Visit: Payer: Medicare Other

## 2015-06-01 ENCOUNTER — Ambulatory Visit
Admission: RE | Admit: 2015-06-01 | Discharge: 2015-06-01 | Disposition: A | Discharge status: Home / Self Care | Payer: Medicare Other | Source: Ambulatory Visit | Attending: Radiation Oncology | Admitting: Radiation Oncology

## 2015-06-02 ENCOUNTER — Ambulatory Visit
Admission: RE | Admit: 2015-06-02 | Discharge: 2015-06-02 | Disposition: A | Discharge status: Home / Self Care | Payer: Medicare Other | Source: Ambulatory Visit | Attending: Radiation Oncology | Admitting: Radiation Oncology

## 2015-06-02 ENCOUNTER — Ambulatory Visit: Admission: RE | Admit: 2015-06-02 | Payer: Medicare Other | Source: Ambulatory Visit

## 2015-06-03 ENCOUNTER — Ambulatory Visit
Admission: RE | Admit: 2015-06-03 | Discharge: 2015-06-03 | Disposition: A | Discharge status: Home / Self Care | Payer: Medicare Other | Source: Ambulatory Visit | Attending: Radiation Oncology | Admitting: Radiation Oncology

## 2015-06-03 ENCOUNTER — Inpatient Hospital Stay: Payer: Medicare Other

## 2015-06-03 DIAGNOSIS — C329 Malignant neoplasm of larynx, unspecified: Secondary | ICD-10-CM | POA: Diagnosis not present

## 2015-06-03 DIAGNOSIS — C320 Malignant neoplasm of glottis: Secondary | ICD-10-CM

## 2015-06-03 DIAGNOSIS — Z510 Encounter for antineoplastic radiation therapy: Secondary | ICD-10-CM | POA: Diagnosis not present

## 2015-06-03 LAB — CBC
HCT: 43.2 % (ref 40.0–52.0)
HEMOGLOBIN: 14.3 g/dL (ref 13.0–18.0)
MCH: 28.8 pg (ref 26.0–34.0)
MCHC: 33.1 g/dL (ref 32.0–36.0)
MCV: 87.2 fL (ref 80.0–100.0)
PLATELETS: 133 10*3/uL — AB (ref 150–440)
RBC: 4.95 MIL/uL (ref 4.40–5.90)
RDW: 14.5 % (ref 11.5–14.5)
WBC: 4.9 10*3/uL (ref 3.8–10.6)

## 2015-06-04 ENCOUNTER — Ambulatory Visit
Admission: RE | Admit: 2015-06-04 | Discharge: 2015-06-04 | Disposition: A | Discharge status: Home / Self Care | Payer: Medicare Other | Source: Ambulatory Visit | Attending: Radiation Oncology | Admitting: Radiation Oncology

## 2015-06-04 ENCOUNTER — Inpatient Hospital Stay: Payer: Medicare Other

## 2015-06-04 DIAGNOSIS — Z510 Encounter for antineoplastic radiation therapy: Secondary | ICD-10-CM | POA: Diagnosis not present

## 2015-06-05 ENCOUNTER — Ambulatory Visit
Admission: RE | Admit: 2015-06-05 | Discharge: 2015-06-05 | Disposition: A | Discharge status: Home / Self Care | Payer: Medicare Other | Source: Ambulatory Visit | Attending: Radiation Oncology | Admitting: Radiation Oncology

## 2015-06-05 DIAGNOSIS — Z510 Encounter for antineoplastic radiation therapy: Secondary | ICD-10-CM | POA: Diagnosis not present

## 2015-06-08 ENCOUNTER — Ambulatory Visit
Admission: RE | Admit: 2015-06-08 | Discharge: 2015-06-08 | Disposition: A | Discharge status: Home / Self Care | Payer: Medicare Other | Source: Ambulatory Visit | Attending: Radiation Oncology | Admitting: Radiation Oncology

## 2015-06-08 DIAGNOSIS — Z510 Encounter for antineoplastic radiation therapy: Secondary | ICD-10-CM | POA: Diagnosis not present

## 2015-06-09 ENCOUNTER — Other Ambulatory Visit: Payer: Self-pay | Admitting: *Deleted

## 2015-06-09 ENCOUNTER — Ambulatory Visit
Admission: RE | Admit: 2015-06-09 | Discharge: 2015-06-09 | Disposition: A | Discharge status: Home / Self Care | Payer: Medicare Other | Source: Ambulatory Visit | Attending: Radiation Oncology | Admitting: Radiation Oncology

## 2015-06-09 DIAGNOSIS — Z510 Encounter for antineoplastic radiation therapy: Secondary | ICD-10-CM | POA: Diagnosis not present

## 2015-06-09 MED ORDER — DEXAMETHASONE 4 MG PO TABS: 4.0000 mg | ORAL_TABLET | Freq: Every day | ORAL | Status: DC

## 2015-06-10 ENCOUNTER — Ambulatory Visit
Admission: RE | Admit: 2015-06-10 | Discharge: 2015-06-10 | Disposition: A | Discharge status: Home / Self Care | Payer: Medicare Other | Source: Ambulatory Visit | Attending: Radiation Oncology | Admitting: Radiation Oncology

## 2015-06-10 DIAGNOSIS — Z510 Encounter for antineoplastic radiation therapy: Secondary | ICD-10-CM | POA: Diagnosis not present

## 2015-06-11 ENCOUNTER — Ambulatory Visit
Admission: RE | Admit: 2015-06-11 | Discharge: 2015-06-11 | Disposition: A | Discharge status: Home / Self Care | Payer: Medicare Other | Source: Ambulatory Visit | Attending: Radiation Oncology | Admitting: Radiation Oncology

## 2015-06-11 ENCOUNTER — Inpatient Hospital Stay: Payer: Medicare Other

## 2015-06-11 DIAGNOSIS — Z510 Encounter for antineoplastic radiation therapy: Secondary | ICD-10-CM | POA: Diagnosis not present

## 2015-06-11 DIAGNOSIS — C329 Malignant neoplasm of larynx, unspecified: Secondary | ICD-10-CM | POA: Diagnosis not present

## 2015-06-11 DIAGNOSIS — C320 Malignant neoplasm of glottis: Secondary | ICD-10-CM

## 2015-06-11 LAB — CBC
HEMATOCRIT: 40.2 % (ref 40.0–52.0)
Hemoglobin: 13.5 g/dL (ref 13.0–18.0)
MCH: 29.2 pg (ref 26.0–34.0)
MCHC: 33.7 g/dL (ref 32.0–36.0)
MCV: 86.8 fL (ref 80.0–100.0)
Platelets: 128 10*3/uL — ABNORMAL LOW (ref 150–440)
RBC: 4.63 MIL/uL (ref 4.40–5.90)
RDW: 14.2 % (ref 11.5–14.5)
WBC: 7.2 10*3/uL (ref 3.8–10.6)

## 2015-06-12 ENCOUNTER — Ambulatory Visit
Admission: RE | Admit: 2015-06-12 | Discharge: 2015-06-12 | Disposition: A | Discharge status: Home / Self Care | Payer: Medicare Other | Source: Ambulatory Visit | Attending: Radiation Oncology | Admitting: Radiation Oncology

## 2015-06-12 DIAGNOSIS — Z510 Encounter for antineoplastic radiation therapy: Secondary | ICD-10-CM | POA: Diagnosis not present

## 2015-06-15 ENCOUNTER — Ambulatory Visit
Admission: RE | Admit: 2015-06-15 | Discharge: 2015-06-15 | Disposition: A | Discharge status: Home / Self Care | Payer: Medicare Other | Source: Ambulatory Visit | Attending: Radiation Oncology | Admitting: Radiation Oncology

## 2015-06-15 DIAGNOSIS — Z510 Encounter for antineoplastic radiation therapy: Secondary | ICD-10-CM | POA: Diagnosis not present

## 2015-06-16 ENCOUNTER — Ambulatory Visit
Admission: RE | Admit: 2015-06-16 | Discharge: 2015-06-16 | Disposition: A | Discharge status: Home / Self Care | Payer: Medicare Other | Source: Ambulatory Visit | Attending: Radiation Oncology | Admitting: Radiation Oncology

## 2015-06-16 DIAGNOSIS — Z510 Encounter for antineoplastic radiation therapy: Secondary | ICD-10-CM | POA: Diagnosis not present

## 2015-06-17 ENCOUNTER — Ambulatory Visit
Admission: RE | Admit: 2015-06-17 | Discharge: 2015-06-17 | Disposition: A | Discharge status: Home / Self Care | Payer: Medicare Other | Source: Ambulatory Visit | Attending: Radiation Oncology | Admitting: Radiation Oncology

## 2015-06-17 DIAGNOSIS — Z510 Encounter for antineoplastic radiation therapy: Secondary | ICD-10-CM | POA: Diagnosis not present

## 2015-06-18 ENCOUNTER — Ambulatory Visit
Admission: RE | Admit: 2015-06-18 | Discharge: 2015-06-18 | Disposition: A | Discharge status: Home / Self Care | Payer: Medicare Other | Source: Ambulatory Visit | Attending: Radiation Oncology | Admitting: Radiation Oncology

## 2015-06-18 DIAGNOSIS — Z510 Encounter for antineoplastic radiation therapy: Secondary | ICD-10-CM | POA: Diagnosis not present

## 2015-06-19 ENCOUNTER — Ambulatory Visit
Admission: RE | Admit: 2015-06-19 | Discharge: 2015-06-19 | Disposition: A | Discharge status: Home / Self Care | Payer: Medicare Other | Source: Ambulatory Visit | Attending: Radiation Oncology | Admitting: Radiation Oncology

## 2015-06-19 DIAGNOSIS — Z510 Encounter for antineoplastic radiation therapy: Secondary | ICD-10-CM | POA: Diagnosis not present

## 2015-06-21 ENCOUNTER — Ambulatory Visit
Admission: RE | Admit: 2015-06-21 | Discharge: 2015-06-21 | Disposition: A | Discharge status: Home / Self Care | Payer: Medicare Other | Source: Ambulatory Visit | Attending: Radiation Oncology | Admitting: Radiation Oncology

## 2015-06-22 ENCOUNTER — Ambulatory Visit: Payer: Medicare Other

## 2015-06-22 ENCOUNTER — Ambulatory Visit: Admission: RE | Admit: 2015-06-22 | Payer: Medicare Other | Source: Ambulatory Visit

## 2015-06-22 DIAGNOSIS — Z510 Encounter for antineoplastic radiation therapy: Secondary | ICD-10-CM | POA: Diagnosis not present

## 2015-06-23 ENCOUNTER — Ambulatory Visit: Payer: Medicare Other

## 2015-06-23 DIAGNOSIS — Z510 Encounter for antineoplastic radiation therapy: Secondary | ICD-10-CM | POA: Diagnosis not present

## 2015-06-24 ENCOUNTER — Ambulatory Visit: Payer: Medicare Other

## 2015-06-24 DIAGNOSIS — Z510 Encounter for antineoplastic radiation therapy: Secondary | ICD-10-CM | POA: Diagnosis not present

## 2015-06-25 ENCOUNTER — Ambulatory Visit
Admission: RE | Admit: 2015-06-25 | Discharge: 2015-06-25 | Disposition: A | Discharge status: Home / Self Care | Payer: Medicare Other | Source: Ambulatory Visit | Attending: Radiation Oncology | Admitting: Radiation Oncology

## 2015-06-25 DIAGNOSIS — Z510 Encounter for antineoplastic radiation therapy: Secondary | ICD-10-CM | POA: Diagnosis not present

## 2015-06-26 ENCOUNTER — Other Ambulatory Visit: Payer: Self-pay | Admitting: *Deleted

## 2015-06-26 ENCOUNTER — Ambulatory Visit
Admission: RE | Admit: 2015-06-26 | Discharge: 2015-06-26 | Disposition: A | Discharge status: Home / Self Care | Payer: Medicare Other | Source: Ambulatory Visit | Attending: Radiation Oncology | Admitting: Radiation Oncology

## 2015-06-26 DIAGNOSIS — Z510 Encounter for antineoplastic radiation therapy: Secondary | ICD-10-CM | POA: Diagnosis not present

## 2015-06-26 MED ORDER — DEXAMETHASONE 4 MG PO TABS: 4.0000 mg | ORAL_TABLET | Freq: Every day | ORAL | Status: DC

## 2015-06-29 ENCOUNTER — Ambulatory Visit: Payer: Medicare Other

## 2015-06-29 DIAGNOSIS — Z510 Encounter for antineoplastic radiation therapy: Secondary | ICD-10-CM | POA: Diagnosis not present

## 2015-06-30 ENCOUNTER — Ambulatory Visit: Payer: Medicare Other

## 2015-07-10 ENCOUNTER — Encounter: Payer: Self-pay | Admitting: Radiation Oncology

## 2015-07-10 ENCOUNTER — Other Ambulatory Visit: Payer: Self-pay | Admitting: *Deleted

## 2015-07-10 ENCOUNTER — Ambulatory Visit
Admission: RE | Admit: 2015-07-10 | Discharge: 2015-07-10 | Disposition: A | Discharge status: Home / Self Care | Payer: Medicare Other | Source: Ambulatory Visit | Attending: Radiation Oncology | Admitting: Radiation Oncology

## 2015-07-10 DIAGNOSIS — C320 Malignant neoplasm of glottis: Secondary | ICD-10-CM

## 2015-07-10 MED ORDER — DEXAMETHASONE 4 MG PO TABS: 4.0000 mg | ORAL_TABLET | Freq: Two times a day (BID) | ORAL | Status: DC

## 2015-07-10 NOTE — Progress Notes (Signed)
Radiation Oncology Follow up Note  Name: Matthew David   Date:   07/10/2015 MRN:  [id number] DOB: [birth year]    This 81 y.o. male presents to the clinic today for self requested follow-up for marked sore throat and dysphasia.  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 He is seen today in his own request having significant dysphagia and sore throat. He is on 2 mg of Decadron a day. His weight is stable..  COMPLICATIONS OF TREATMENT: present  FOLLOW UP COMPLIANCE: keeps appointments   PHYSICAL EXAM:  There were no vitals taken for this visit. Oral cavity is clear. Indirect mirror examination shows significant edema in the larynx and supraglottic larynx. No evidence of subject gastric cervical or supraclavicular Jocelyn Lamer or adenopathy is appreciated. Well-developed well-nourished patient in NAD. HEENT reveals PERLA, EOMI, discs not visualized.  Oral cavity is clear. No oral mucosal lesions are identified. Neck is clear without evidence of cervical or supraclavicular adenopathy. Lungs are clear to A&P. Cardiac examination is essentially unremarkable with regular rate and rhythm without murmur rub or thrill. Abdomen is benign with no organomegaly or masses noted. Motor sensory and DTR levels are equal and symmetric in the upper and lower extremities. Cranial nerves II through XII are grossly intact. Proprioception is intact. No peripheral adenopathy or edema is identified. No motor or sensory levels are noted. Crude visual fields are within normal range.  RADIOLOGY RESULTS: No current films for review  PLAN: At this time I'm upping his Decadron to 4 mg twice a day. Also continues on Carafate rinses as well as proton pump inhibitor. I see no evidence of oral candidiasis. We'll see him back next week to see how his progress is and if he is doing better will start  tapering his steroids. Patient again knows to call sooner with any concerns.  I would like to take this opportunity for allowing me to participate in the care of your patient.[name]., MD

## 2015-07-15 ENCOUNTER — Encounter: Payer: Self-pay | Admitting: Radiation Oncology

## 2015-07-15 ENCOUNTER — Ambulatory Visit
Admission: RE | Admit: 2015-07-15 | Discharge: 2015-07-15 | Disposition: A | Discharge status: Home / Self Care | Payer: Medicare Other | Source: Ambulatory Visit | Attending: Radiation Oncology | Admitting: Radiation Oncology

## 2015-07-15 VITALS — BP 138/68 | HR 72

## 2015-07-15 DIAGNOSIS — C320 Malignant neoplasm of glottis: Secondary | ICD-10-CM

## 2015-07-15 NOTE — Progress Notes (Signed)
Radiation Oncology Follow up Note  Name: Matthew David   Date:   07/15/2015 MRN:  [id number] DOB: 1922-03-28    This 81 y.o. male presents to the clinic today for patient is seen in follow-up for increased dysphagia and sore throat secondary to his recent treatment for laryngeal carcinoma. Currently on 2 mg of Decadron twice a day. He is also on Carafate rinses. He states the pain in his throat is slightly improving. He continues to have some significant dysphagia.[name]  REFERRING PROVIDER: Adin Hector, MD  HPI: As above.  COMPLICATIONS OF TREATMENT: present and Sore throat  FOLLOW UP COMPLIANCE: keeps appointments   PHYSICAL EXAM:  BP 138/68 mmHg  Pulse 72 Oral cavity is clear. Indirect mirror examination shows significant edema in the larynx. Vallecula and base of tongue within normal limits. Neck is clear without evidence of subject gastric cervical or supraclavicular adenopathy. Well-developed well-nourished patient in NAD. HEENT reveals PERLA, EOMI, discs not visualized.  Oral cavity is clear. No oral mucosal lesions are identified. Neck is clear without evidence of cervical or supraclavicular adenopathy. Lungs are clear to A&P. Cardiac examination is essentially unremarkable with regular rate and rhythm without murmur rub or thrill. Abdomen is benign with no organomegaly or masses noted. Motor sensory and DTR levels are equal and symmetric in the upper and lower extremities. Cranial nerves II through XII are grossly intact. Proprioception is intact. No peripheral adenopathy or edema is identified. No motor or sensory levels are noted. Crude visual fields are within normal range.  RADIOLOGY RESULTS: No current films for review ts PLAN: At the present time I'll continue on his Decadron as prescribed. I'm also adding Viscous Xylocaine for dysphagia. Also giving him a prescription for Vicodin to use 1 tab every 6-8 hours when necessary pain. I'll see him back next week and start  tapering his Decadron should he continue to improve.  I would like to take this opportunity for allowing me to participate in the care of your patient.[name]., MD

## 2015-07-20 ENCOUNTER — Other Ambulatory Visit: Payer: Self-pay | Admitting: Specialist

## 2015-07-20 DIAGNOSIS — M48061 Spinal stenosis, lumbar region without neurogenic claudication: Secondary | ICD-10-CM

## 2015-07-21 ENCOUNTER — Ambulatory Visit
Admission: RE | Admit: 2015-07-21 | Discharge: 2015-07-21 | Disposition: A | Discharge status: Home / Self Care | Payer: Medicare Other | Source: Ambulatory Visit | Attending: Specialist | Admitting: Specialist

## 2015-07-21 DIAGNOSIS — M47816 Spondylosis without myelopathy or radiculopathy, lumbar region: Secondary | ICD-10-CM | POA: Diagnosis not present

## 2015-07-21 DIAGNOSIS — M4806 Spinal stenosis, lumbar region: Secondary | ICD-10-CM | POA: Diagnosis not present

## 2015-07-21 DIAGNOSIS — M48061 Spinal stenosis, lumbar region without neurogenic claudication: Secondary | ICD-10-CM

## 2015-07-24 ENCOUNTER — Other Ambulatory Visit: Payer: Self-pay | Admitting: *Deleted

## 2015-07-24 MED ORDER — LIDOCAINE VISCOUS 2 % MT SOLN: 20.0000 mL | Freq: Three times a day (TID) | OROMUCOSAL | Status: DC

## 2015-07-27 ENCOUNTER — Ambulatory Visit
Admission: RE | Admit: 2015-07-27 | Discharge: 2015-07-27 | Disposition: A | Discharge status: Home / Self Care | Payer: Medicare Other | Source: Ambulatory Visit | Attending: Radiation Oncology | Admitting: Radiation Oncology

## 2015-07-31 ENCOUNTER — Ambulatory Visit: Payer: Medicare Other | Admitting: Radiation Oncology

## 2015-08-13 ENCOUNTER — Encounter: Payer: Self-pay | Admitting: *Deleted

## 2015-08-13 ENCOUNTER — Inpatient Hospital Stay
Admission: EM | Admit: 2015-08-13 | Discharge: 2015-08-15 | DRG: 300 | Disposition: A | Discharge status: Home Health | Payer: Medicare Other | Attending: Internal Medicine | Admitting: Internal Medicine

## 2015-08-13 ENCOUNTER — Emergency Department: Payer: Medicare Other

## 2015-08-13 DIAGNOSIS — G8929 Other chronic pain: Secondary | ICD-10-CM | POA: Diagnosis present

## 2015-08-13 DIAGNOSIS — M545 Low back pain: Secondary | ICD-10-CM | POA: Diagnosis present

## 2015-08-13 DIAGNOSIS — Z87891 Personal history of nicotine dependence: Secondary | ICD-10-CM

## 2015-08-13 DIAGNOSIS — Z79899 Other long term (current) drug therapy: Secondary | ICD-10-CM | POA: Diagnosis not present

## 2015-08-13 DIAGNOSIS — I82432 Acute embolism and thrombosis of left popliteal vein: Secondary | ICD-10-CM | POA: Diagnosis present

## 2015-08-13 DIAGNOSIS — I129 Hypertensive chronic kidney disease with stage 1 through stage 4 chronic kidney disease, or unspecified chronic kidney disease: Secondary | ICD-10-CM | POA: Diagnosis present

## 2015-08-13 DIAGNOSIS — R290 Tetany: Secondary | ICD-10-CM | POA: Diagnosis present

## 2015-08-13 DIAGNOSIS — E871 Hypo-osmolality and hyponatremia: Secondary | ICD-10-CM | POA: Diagnosis present

## 2015-08-13 DIAGNOSIS — D6959 Other secondary thrombocytopenia: Secondary | ICD-10-CM | POA: Diagnosis present

## 2015-08-13 DIAGNOSIS — Z955 Presence of coronary angioplasty implant and graft: Secondary | ICD-10-CM | POA: Diagnosis not present

## 2015-08-13 DIAGNOSIS — Z8521 Personal history of malignant neoplasm of larynx: Secondary | ICD-10-CM | POA: Diagnosis not present

## 2015-08-13 DIAGNOSIS — I82412 Acute embolism and thrombosis of left femoral vein: Principal | ICD-10-CM | POA: Diagnosis present

## 2015-08-13 DIAGNOSIS — Z96651 Presence of right artificial knee joint: Secondary | ICD-10-CM | POA: Diagnosis present

## 2015-08-13 DIAGNOSIS — N179 Acute kidney failure, unspecified: Secondary | ICD-10-CM | POA: Diagnosis present

## 2015-08-13 DIAGNOSIS — N189 Chronic kidney disease, unspecified: Secondary | ICD-10-CM | POA: Diagnosis present

## 2015-08-13 DIAGNOSIS — I82402 Acute embolism and thrombosis of unspecified deep veins of left lower extremity: Secondary | ICD-10-CM

## 2015-08-13 DIAGNOSIS — Z7982 Long term (current) use of aspirin: Secondary | ICD-10-CM | POA: Diagnosis not present

## 2015-08-13 DIAGNOSIS — I2510 Atherosclerotic heart disease of native coronary artery without angina pectoris: Secondary | ICD-10-CM | POA: Diagnosis present

## 2015-08-13 DIAGNOSIS — Z923 Personal history of irradiation: Secondary | ICD-10-CM | POA: Diagnosis not present

## 2015-08-13 DIAGNOSIS — N19 Unspecified kidney failure: Secondary | ICD-10-CM

## 2015-08-13 DIAGNOSIS — G8918 Other acute postprocedural pain: Secondary | ICD-10-CM

## 2015-08-13 DIAGNOSIS — D72829 Elevated white blood cell count, unspecified: Secondary | ICD-10-CM | POA: Diagnosis present

## 2015-08-13 DIAGNOSIS — M7989 Other specified soft tissue disorders: Secondary | ICD-10-CM

## 2015-08-13 LAB — COMPREHENSIVE METABOLIC PANEL
ALT: 30 U/L (ref 17–63)
AST: 19 U/L (ref 15–41)
Albumin: 3.7 g/dL (ref 3.5–5.0)
Alkaline Phosphatase: 47 U/L (ref 38–126)
Anion gap: 7 (ref 5–15)
BUN: 68 mg/dL — ABNORMAL HIGH (ref 6–20)
CHLORIDE: 102 mmol/L (ref 101–111)
CO2: 24 mmol/L (ref 22–32)
CREATININE: 1.82 mg/dL — AB (ref 0.61–1.24)
Calcium: 8.6 mg/dL — ABNORMAL LOW (ref 8.9–10.3)
GFR, EST AFRICAN AMERICAN: 36 mL/min — AB (ref >60)
GFR, EST NON AFRICAN AMERICAN: 31 mL/min — AB (ref >60)
Glucose, Bld: 112 mg/dL — ABNORMAL HIGH (ref 65–99)
Potassium: 3.9 mmol/L (ref 3.5–5.1)
Sodium: 133 mmol/L — ABNORMAL LOW (ref 135–145)
Total Bilirubin: 1.5 mg/dL — ABNORMAL HIGH (ref 0.3–1.2)
Total Protein: 6.6 g/dL (ref 6.5–8.1)

## 2015-08-13 LAB — PROTIME-INR
INR: 1.11
PROTHROMBIN TIME: 14.5 s (ref 11.4–15.0)

## 2015-08-13 LAB — CBC WITH DIFFERENTIAL/PLATELET
BASOS PCT: 1 %
Basophils Absolute: 0.1 10*3/uL (ref 0–0.1)
EOS ABS: 0 10*3/uL (ref 0–0.7)
Eosinophils Relative: 0 %
HCT: 43 % (ref 40.0–52.0)
HEMOGLOBIN: 14 g/dL (ref 13.0–18.0)
LYMPHS ABS: 0.9 10*3/uL — AB (ref 1.0–3.6)
Lymphocytes Relative: 7 %
MCH: 28.3 pg (ref 26.0–34.0)
MCHC: 32.7 g/dL (ref 32.0–36.0)
MCV: 86.6 fL (ref 80.0–100.0)
Monocytes Absolute: 0.7 10*3/uL (ref 0.2–1.0)
Monocytes Relative: 6 %
NEUTROS PCT: 86 %
Neutro Abs: 10.9 10*3/uL — ABNORMAL HIGH (ref 1.4–6.5)
Platelets: 110 10*3/uL — ABNORMAL LOW (ref 150–440)
RBC: 4.96 MIL/uL (ref 4.40–5.90)
RDW: 15.8 % — ABNORMAL HIGH (ref 11.5–14.5)
WBC: 12.6 10*3/uL — AB (ref 3.8–10.6)

## 2015-08-13 LAB — APTT: APTT: 26 s (ref 24–36)

## 2015-08-13 MED ORDER — MORPHINE SULFATE (PF) 2 MG/ML IV SOLN: 2.0000 mg | INTRAVENOUS | Status: DC | PRN

## 2015-08-13 MED ORDER — LYSINE 1000 MG PO TABS: 1.0000 | ORAL_TABLET | ORAL | Status: DC

## 2015-08-13 MED ORDER — DEXAMETHASONE 4 MG PO TABS: 4.0000 mg | ORAL_TABLET | Freq: Two times a day (BID) | ORAL | Status: DC

## 2015-08-13 MED ORDER — HEPARIN SODIUM (PORCINE) 5000 UNIT/ML IJ SOLN: 4000.0000 [IU] | Freq: Once | INTRAMUSCULAR | Status: DC

## 2015-08-13 MED ORDER — PRAVASTATIN SODIUM 20 MG PO TABS
10.0000 mg | ORAL_TABLET | Freq: Every day | ORAL | Status: DC
  Administered 2015-08-14: 10 mg via ORAL
  Filled 2015-08-13: qty 1

## 2015-08-13 MED ORDER — SUCRALFATE 1 G PO TABS
1.0000 g | ORAL_TABLET | Freq: Three times a day (TID) | ORAL | Status: DC
  Administered 2015-08-14 – 2015-08-15 (×5): 1 g via ORAL
  Filled 2015-08-13 (×6): qty 1

## 2015-08-13 MED ORDER — LORAZEPAM 1 MG PO TABS
1.0000 mg | ORAL_TABLET | Freq: Every day | ORAL | Status: DC
  Administered 2015-08-14 (×2): 1 mg via ORAL
  Filled 2015-08-13 (×2): qty 1

## 2015-08-13 MED ORDER — AZELASTINE HCL 0.1 % NA SOLN
1.0000 | Freq: Two times a day (BID) | NASAL | Status: DC
  Administered 2015-08-14 (×2): 1 via NASAL
  Filled 2015-08-13: qty 30

## 2015-08-13 MED ORDER — TAMSULOSIN HCL 0.4 MG PO CAPS
0.4000 mg | ORAL_CAPSULE | Freq: Two times a day (BID) | ORAL | Status: DC
  Administered 2015-08-14 – 2015-08-15 (×4): 0.4 mg via ORAL
  Filled 2015-08-13 (×4): qty 1

## 2015-08-13 MED ORDER — FLUTICASONE PROPIONATE 50 MCG/ACT NA SUSP
2.0000 | Freq: Every day | NASAL | Status: DC
  Administered 2015-08-14: 2 via NASAL
  Filled 2015-08-13: qty 16

## 2015-08-13 MED ORDER — HYDROCODONE-ACETAMINOPHEN 7.5-325 MG/15ML PO SOLN: 15.0000 mL | Freq: Four times a day (QID) | ORAL | Status: DC | PRN

## 2015-08-13 MED ORDER — CELECOXIB 200 MG PO CAPS
200.0000 mg | ORAL_CAPSULE | ORAL | Status: DC
  Administered 2015-08-14: 200 mg via ORAL
  Filled 2015-08-13: qty 1

## 2015-08-13 MED ORDER — MIRTAZAPINE 15 MG PO TABS: 7.5000 mg | ORAL_TABLET | Freq: Every evening | ORAL | Status: DC | PRN

## 2015-08-13 MED ORDER — ASPIRIN EC 81 MG PO TBEC
81.0000 mg | DELAYED_RELEASE_TABLET | Freq: Every day | ORAL | Status: DC
  Administered 2015-08-14 – 2015-08-15 (×2): 81 mg via ORAL
  Filled 2015-08-13 (×2): qty 1

## 2015-08-13 MED ORDER — SODIUM CHLORIDE 0.9 % IJ SOLN
3.0000 mL | Freq: Two times a day (BID) | INTRAMUSCULAR | Status: DC
  Administered 2015-08-14: 3 mL via INTRAVENOUS

## 2015-08-13 MED ORDER — HYDROCODONE-ACETAMINOPHEN 5-325 MG PO TABS: 1.0000 | ORAL_TABLET | Freq: Four times a day (QID) | ORAL | Status: DC | PRN

## 2015-08-13 MED ORDER — ACETAMINOPHEN 325 MG PO TABS: 650.0000 mg | ORAL_TABLET | Freq: Four times a day (QID) | ORAL | Status: DC | PRN

## 2015-08-13 MED ORDER — VALSARTAN-HYDROCHLOROTHIAZIDE 80-12.5 MG PO TABS: 1.0000 | ORAL_TABLET | ORAL | Status: DC

## 2015-08-13 MED ORDER — HEPARIN SODIUM (PORCINE) 5000 UNIT/ML IJ SOLN
INTRAMUSCULAR | Status: AC
  Filled 2015-08-13: qty 1

## 2015-08-13 MED ORDER — CRANBERRY 500 MG PO CAPS: ORAL_CAPSULE | Freq: Two times a day (BID) | ORAL | Status: DC

## 2015-08-13 MED ORDER — PANTOPRAZOLE SODIUM 40 MG PO TBEC
40.0000 mg | DELAYED_RELEASE_TABLET | Freq: Two times a day (BID) | ORAL | Status: DC
  Administered 2015-08-14 – 2015-08-15 (×3): 40 mg via ORAL
  Filled 2015-08-13 (×3): qty 1

## 2015-08-13 MED ORDER — LIDOCAINE VISCOUS 2 % MT SOLN
20.0000 mL | Freq: Three times a day (TID) | OROMUCOSAL | Status: DC
  Administered 2015-08-14 – 2015-08-15 (×4): 20 mL via OROMUCOSAL
  Filled 2015-08-13 (×7): qty 20

## 2015-08-13 MED ORDER — PROSTATE SR 160-250 MG PO CAPS: ORAL_CAPSULE | Freq: Two times a day (BID) | ORAL | Status: DC

## 2015-08-13 MED ORDER — MAGIC MOUTHWASH
10.0000 mL | Freq: Four times a day (QID) | ORAL | Status: DC | PRN
  Filled 2015-08-13: qty 10

## 2015-08-13 MED ORDER — APIXABAN 5 MG PO TABS: 10.0000 mg | ORAL_TABLET | Freq: Two times a day (BID) | ORAL | Status: DC

## 2015-08-13 MED ORDER — ADULT MULTIVITAMIN W/MINERALS CH
1.0000 | ORAL_TABLET | ORAL | Status: DC
  Administered 2015-08-14 – 2015-08-15 (×2): 1 via ORAL
  Filled 2015-08-13 (×2): qty 1

## 2015-08-13 MED ORDER — LORATADINE 10 MG PO TABS
10.0000 mg | ORAL_TABLET | Freq: Every day | ORAL | Status: DC
  Administered 2015-08-14 – 2015-08-15 (×2): 10 mg via ORAL
  Filled 2015-08-13 (×2): qty 1

## 2015-08-13 MED ORDER — IRBESARTAN 75 MG PO TABS: 75.0000 mg | ORAL_TABLET | Freq: Every day | ORAL | Status: DC

## 2015-08-13 MED ORDER — ONDANSETRON HCL 4 MG/2ML IJ SOLN
4.0000 mg | Freq: Four times a day (QID) | INTRAMUSCULAR | Status: DC | PRN
Start: 2015-08-13 — End: 2015-08-15

## 2015-08-13 MED ORDER — METOPROLOL TARTRATE 25 MG PO TABS
12.5000 mg | ORAL_TABLET | Freq: Two times a day (BID) | ORAL | Status: DC
  Administered 2015-08-14 (×3): 12.5 mg via ORAL
  Filled 2015-08-13 (×4): qty 1

## 2015-08-13 MED ORDER — ONDANSETRON HCL 4 MG PO TABS: 4.0000 mg | ORAL_TABLET | Freq: Four times a day (QID) | ORAL | Status: DC | PRN

## 2015-08-13 MED ORDER — HEPARIN (PORCINE) IN NACL 100-0.45 UNIT/ML-% IJ SOLN: 10.0000 [IU]/kg/h | Freq: Once | INTRAMUSCULAR | Status: DC

## 2015-08-13 MED ORDER — HEPARIN (PORCINE) IN NACL 100-0.45 UNIT/ML-% IJ SOLN
12.0000 [IU]/kg/h | INTRAMUSCULAR | Status: DC
  Administered 2015-08-13: 12 [IU]/kg/h via INTRAVENOUS
  Filled 2015-08-13: qty 250

## 2015-08-13 MED ORDER — HYDROCHLOROTHIAZIDE 12.5 MG PO CAPS: 12.5000 mg | ORAL_CAPSULE | Freq: Every day | ORAL | Status: DC

## 2015-08-13 MED ORDER — ACETAMINOPHEN 650 MG RE SUPP
650.0000 mg | Freq: Four times a day (QID) | RECTAL | Status: DC | PRN
Start: 2015-08-13 — End: 2015-08-15

## 2015-08-13 MED ORDER — APIXABAN 5 MG PO TABS
10.0000 mg | ORAL_TABLET | Freq: Two times a day (BID) | ORAL | Status: DC
  Filled 2015-08-13: qty 2

## 2015-08-13 MED ORDER — HEPARIN BOLUS VIA INFUSION
4000.0000 [IU] | Freq: Once | INTRAVENOUS | Status: AC
Start: 2015-08-13 — End: 2015-08-13
  Administered 2015-08-13: 4000 [IU] via INTRAVENOUS
  Filled 2015-08-13: qty 4000

## 2015-08-13 NOTE — H&P (Signed)
[facility] [facility] [place] NAME: Matthew David    MR#:  [id number]  DATE OF BIRTH:  07-06-1922  DATE OF ADMISSION:  08/13/2015  PRIMARY CARE PHYSICIAN: Tama High III, MD   REQUESTING/REFERRING PHYSICIAN: Malinda  CHIEF COMPLAINT:   Left leg pain HISTORY OF PRESENT ILLNESS:  Matthew David  is a 81 y.o. male with a known history of laryngeal cancer, coronary artery disease and multiple other medical problems is presenting to the ED with a chief complaint of severe left lower extremity pain just been progressively getting worse for the past 2-3 days associated with swelling . Patient has received epidural shot for his chronic low back pain and lower extremity weakness approximately one week ago. Denies any history of bleeding. Venous Dopplers have revealed extensive left lower extremity DVT. Patient is started on heparin drip and hospitalist team is called to admit the patient  PAST MEDICAL HISTORY:   Past Medical History  Diagnosis Date  . Collagen vascular disease ([place])     cardiac stents and blockage  . Hypertension   . Status post right knee replacement   . Hx of appendectomy   . Stented coronary artery   . Cancer [facility])     Larynx Cancer 03/2015    PAST SURGICAL HISTOIRY:   Past Surgical History  Procedure Laterality Date  . Appendectomy  1944  . Coronary angioplasty with stent placement  2012  . Tonsillectomy  1952  . Shoulder surgery Left 2006  . Joint replacement Right 2008    knee  . Knee arthroscopy Right 2006  . Cataract extraction w/ intraocular lens  implant, bilateral Bilateral 2011    both eye done in the same year  . Direct laryngoscopy N/A 04/08/2015    Procedure: DIRECT LARYNGOSCOPY;  Surgeon: Clyde Canterbury, MD;  Location: [facility] ORS;  Service: ENT;  Laterality: N/A;    SOCIAL HISTORY:   Social History  Substance Use Topics  . Smoking status: Former Smoker    Types: Cigars, Pipe    Quit date: 03/30/1984   . Smokeless tobacco: Not on file  . Alcohol Use: No    FAMILY HISTORY:  History reviewed. No pertinent family history.  DRUG ALLERGIES:   Allergies  Allergen Reactions  . Gabapentin Nausea Only    REVIEW OF SYSTEMS:  CONSTITUTIONAL: No fever, fatigue or weakness.  EYES: No blurred or double vision.  EARS, NOSE, AND THROAT: No tinnitus or ear pain.  RESPIRATORY: No cough, shortness of breath, wheezing or hemoptysis.  CARDIOVASCULAR: No chest pain, orthopnea, edema.  GASTROINTESTINAL: No nausea, vomiting, diarrhea or abdominal pain.  GENITOURINARY: No dysuria, hematuria.  ENDOCRINE: No polyuria, nocturia,  HEMATOLOGY: No anemia, easy bruising or bleeding SKIN: No rash or lesion. MUSCULOSKELETAL: Left leg pain and redness with tightness .No joint pain or arthritis.   NEUROLOGIC: No tingling, numbness, weakness.  PSYCHIATRY: No anxiety or depression.   MEDICATIONS AT HOME:   Prior to Admission medications   Medication Sig Start Date End Date Taking? Authorizing Provider  Alum & Mag Hydroxide-Simeth (MAGIC MOUTHWASH) SOLN Take 10 mLs by mouth 4 (four) times daily as needed for mouth pain.   Yes Historical Provider, MD  aspirin 81 MG tablet Take 81 mg by mouth daily after breakfast.   Yes Historical Provider, MD  azelastine (ASTELIN) 0.1 % nasal spray Place 1 spray into both nostrils 2 (two) times daily. Use in each nostril as directed   Yes Historical Provider, MD  celecoxib (CELEBREX) 200 MG capsule Take 200 mg by mouth every morning.   Yes Historical Provider, MD  cetirizine (ZYRTEC) 10 MG tablet Take 10 mg by mouth daily.   Yes Historical Provider, MD  CRANBERRY CONCENTRATE PO Take 1 tablet by mouth 2 (two) times daily.   Yes Historical Provider, MD  dexamethasone (DECADRON) 4 MG tablet Take 1 tablet (4 mg total) by mouth 2 (two) times daily with a meal. x7 days; then 1 tablet daily x7 days; then 1 tab every other day until finished. 07/10/15  Yes Noreene Filbert, MD   fluticasone (FLONASE) 50 MCG/ACT nasal spray Place 2 sprays into both nostrils daily.   Yes Historical Provider, MD  HYDROcodone-acetaminophen (HYCET) 7.5-325 mg/15 ml solution 10 mL's every 4-6 hours as needed for pain 04/08/15  Yes Clyde Canterbury, MD  lidocaine (XYLOCAINE) 2 % solution Use as directed 20 mLs in the mouth or throat 3 (three) times daily before meals. 07/24/15  Yes Noreene Filbert, MD  LORazepam (ATIVAN) 1 MG tablet Take 1 mg by mouth at bedtime.   Yes Historical Provider, MD  lovastatin (MEVACOR) 20 MG tablet Take 20 mg by mouth at bedtime.   Yes Historical Provider, MD  Lysine 1000 MG TABS Take 1 tablet by mouth every morning.   Yes Historical Provider, MD  metoprolol tartrate (LOPRESSOR) 25 MG tablet Take 12.5 mg by mouth 2 (two) times daily.   Yes Historical Provider, MD  mirtazapine (REMERON) 15 MG tablet Take 7.5 mg by mouth at bedtime as needed (0.5 tablet at bedtime for sleep).    Yes Historical Provider, MD  multivitamin-iron-minerals-folic acid (CENTRUM) chewable tablet Chew 1 tablet by mouth every morning.   Yes Historical Provider, MD  pantoprazole (PROTONIX) 40 MG tablet Take 40 mg by mouth 2 (two) times daily.   Yes Historical Provider, MD  Probiotic Product (ALIGN PO) Take 1 capsule by mouth every morning.   Yes Historical Provider, MD  Saw Palmetto-Phytosterols (PROSTATE SR PO) Take 1 tablet by mouth 2 (two) times daily.   Yes Historical Provider, MD  sucralfate (CARAFATE) 1 G tablet Take 1 tablet (1 g total) by mouth 4 (four) times daily -  with meals and at bedtime. 05/26/15  Yes Noreene Filbert, MD  tamsulosin (FLOMAX) 0.4 MG CAPS capsule Take 0.4 mg by mouth 2 (two) times daily.   Yes Historical Provider, MD  valsartan-hydrochlorothiazide (DIOVAN-HCT) 80-12.5 MG per tablet Take 1 tablet by mouth every morning. 0.5 tablet in am   Yes Historical Provider, MD  HYDROcodone-acetaminophen (NORCO/VICODIN) 5-325 MG tablet Take 1 tablet by mouth every 6 (six) hours as needed for  moderate pain.    Historical Provider, MD  predniSONE (STERAPRED UNI-PAK 21 TAB) 10 MG (21) TBPK tablet Sterapred DS 6 days taper. Take as directed. Patient not taking: Reported on 08/13/2015 04/08/15   Clyde Canterbury, MD      VITAL SIGNS:  Blood pressure 109/63, pulse 102, temperature 97.5 F (36.4 C), temperature source Oral, resp. rate 13, height 5\' 8"  (1.727 m), weight 79.379 kg (175 lb), SpO2 97 %.  PHYSICAL EXAMINATION:  GENERAL:  81 y.o.-year-old patient lying in the bed with no acute distress.  EYES: Pupils equal, round, reactive to light and accommodation. No scleral icterus. Extraocular muscles intact.  HEENT: Head atraumatic, normocephalic. Oropharynx and nasopharynx clear. Raspy voice NECK:  Supple, no jugular venous distention. No thyroid enlargement, no tenderness.  LUNGS: Normal breath sounds bilaterally, no wheezing, rales,rhonchi or crepitation. No use of accessory muscles of respiration.  CARDIOVASCULAR: S1, S2 normal. No murmurs, rubs, or gallops.  ABDOMEN: Soft, nontender, nondistended. Bowel sounds present. No organomegaly or mass.  EXTREMITIES: Left lower extremity tenderness .No pedal edema, cyanosis, or clubbing.  NEUROLOGIC: Cranial nerves II through XII are intact. Muscle strength 5/5 in all extremities. Sensation intact. Gait not checked.  PSYCHIATRIC: The patient is alert and oriented x 3.  SKIN: No obvious rash, lesion, or ulcer.   LABORATORY PANEL:   CBC  Recent Labs Lab 08/13/15 1655  WBC 12.6*  HGB 14.0  HCT 43.0  PLT 110*   ------------------------------------------------------------------------------------------------------------------  Chemistries   Recent Labs Lab 08/13/15 1655  NA 133*  K 3.9  CL 102  CO2 24  GLUCOSE 112*  BUN 68*  CREATININE 1.82*  CALCIUM 8.6*  AST 19  ALT 30  ALKPHOS 47  BILITOT 1.5*   ------------------------------------------------------------------------------------------------------------------  Cardiac  Enzymes No results for input(s): TROPONINI in the last 168 hours. ------------------------------------------------------------------------------------------------------------------  RADIOLOGY:  US Venous Img Lower Unilateral Left  08/13/2015  CLINICAL DATA:  81 year old male with pain and swelling in the left lower extremity EXAM: Left  LOWER EXTREMITY VENOUS DOPPLER ULTRASOUND TECHNIQUE: Gray-scale sonography with graded compression, as well as color Doppler and duplex ultrasound were performed to evaluate the lower extremity deep venous systems from the level of the common femoral vein and including the common femoral, femoral, profunda femoral, popliteal and calf veins including the posterior tibial, peroneal and gastrocnemius veins when visible. The superficial great saphenous vein was also interrogated. Spectral Doppler was utilized to evaluate flow at rest and with distal augmentation maneuvers in the common femoral, femoral and popliteal veins. COMPARISON:  CT of the abdomen pelvis dated 01/30/2015 FINDINGS: Contralateral Common Femoral Vein: Respiratory phasicity is normal and symmetric with the symptomatic side. No evidence of thrombus. Normal compressibility. Common Femoral Vein: There is occlusive thrombus in the common femoral vein. Saphenofemoral Junction: Occlusive thrombus. Profunda Femoral Vein: Occlusive thrombus in the deep femoral vein. Femoral Vein: Occlusive thrombus in the proximal portion of the femoral vein. Nonocclusive thrombus in the mid and distal femoral vein. Popliteal Vein: Nonocclusive thrombus in the popliteal vein. Calf Veins: Nonocclusive thrombus in the visualized calf veins all Superficial Great Saphenous Vein: Not visualized. IMPRESSION: Extensive deep venous thrombosis in the left lower extremity with proximal extension to the left common femoral vein. Critical Value/emergent results were called by telephone at the time of interpretation on 08/13/2015 at 6:21 pm to nurse  [place] who verbally acknowledged these results. Electronically Signed   By: Anner Crete M.D.   On: 08/13/2015 18:24    EKG:   Orders placed or performed during the hospital encounter of 08/13/15  . ED EKG  . ED EKG  . EKG 12-Lead  . EKG 12-Lead    IMPRESSION AND PLAN:    1. Extensive left lower extremity DVT Patient is started on heparin drip Consult is placed to vascular surgery Dr. Lucky Cowboy, for possible thrombectomy versus IVC filter placement as the DVT is extensive Will switch the patient to [place] is vascular surgery is not considering surgery Pain management as needed  2. Acute kidney injury Provided gentle hydration with IV fluids and repeat BMP in a.m. Avoid nephrotoxins  3. History of coronary artery disease status post stent placement Currently asymptomatic. Resume his home medications  4. History of laryngeal cancer status post radiation treatment 33 Outpatient follow-up with oncology Dr.Brahmandy  is recommended   DVT prophylaxis patient is on heparin drip All  the records are reviewed and  case discussed with ED provider. Management plans discussed with the patient, family and they are in agreement.  CODE STATUS: fc/son  TOTAL TIME TAKING CARE OF THIS PATIENT: 45 minutes.    Nicholes Mango M.D on 08/13/2015 at 9:34 PM  Between 7am to 6pm - Pager - [phone]  After 6pm go to [email or web address] - password [email or web address]  [facility]  Office  [phone]  CC: Primary care physician; Adin Hector, MD

## 2015-08-13 NOTE — ED Notes (Signed)
Dr. Cinda Quest notified of critical [place] report

## 2015-08-13 NOTE — ED Notes (Signed)
Pt does not have his hearing aids with him.  Pt has requested staff use raised voices for communication, and that this be put in his chart.

## 2015-08-13 NOTE — ED Notes (Signed)
Pt request contact numbers of friends be put in chart.  They are to be called if pt is being discharged/moved, and/or critical changes to patient status.  Jordan Hawks: [phone], [address] - [phone], [place] Go [phone], [place] Avers - [phone]

## 2015-08-13 NOTE — ED Notes (Signed)
Had epiduiral on Wednesday and has pain in left lower leg since then.  Says he had the procedure in [place] for back pain.  Says he can walk, but has not been walking as much since he had the procedure.

## 2015-08-13 NOTE — ED Notes (Signed)
Pt. Family member states "pt. Has had layrengal cancer, last treatment was 6 weeks ago, 33 radiation treatments."

## 2015-08-13 NOTE — ED Notes (Signed)
MD at bedside. 

## 2015-08-13 NOTE — ED Notes (Signed)
Pt resting in bed, friend at bedside, in no acute distress

## 2015-08-13 NOTE — Progress Notes (Addendum)
ANTICOAGULATION CONSULT NOTE - Initial Consult  Pharmacy Consult for Heparin  Indication: DVT  Allergies  Allergen Reactions  . Gabapentin Nausea Only    Patient Measurements: Height: 5\' 8"  (172.7 cm) Weight: 175 lb (79.379 kg) IBW/kg (Calculated) : 68.4 Heparin Dosing Weight: 79.4 kg   Vital Signs: Temp: 97.5 F (36.4 C) (12/22 1448) Temp Source: Oral (12/22 1448) BP: 106/69 mmHg (12/22 1932) Pulse Rate: 98 (12/22 1932)  Labs:  Recent Labs  08/13/15 1655  HGB 14.0  HCT 43.0  PLT 110*  CREATININE 1.82*    Estimated Creatinine Clearance: 26.1 mL/min (by C-G formula based on Cr of 1.82).   Medical History: Past Medical History  Diagnosis Date  . Collagen vascular disease ([place])     cardiac stents and blockage  . Hypertension   . Status post right knee replacement   . Hx of appendectomy   . Stented coronary artery   . Cancer [facility])     Larynx Cancer 03/2015    Medications:   (Not in a hospital admission)  Assessment: Pharmacy consulted to dose heparin in this 81 year old male admitted with DVT.  No prior anticoag noted.  Goal of Therapy:  Heparin level 0.3-0.7 units/ml   Plan:  Give 4000 units bolus x 1 Start heparin infusion at 950 units/hr  Will check CBC daily. Will draw 1st HL 8 hrs after start of drip on 12/23 @ 06:00.   Sumner Kirchman D 08/13/2015,9:00 PM

## 2015-08-13 NOTE — ED Provider Notes (Addendum)
[facility] Emergency Department Provider Note  ____________________________________________  Time seen: Approximately 6:30 PM  I have reviewed the triage vital signs and the nursing notes.   HISTORY  Chief Complaint Leg Pain    HPI Matthew David is a 81 y.o. male who said a week and half ago Wednesday he had a epidural shot for his back. He had a little bit of left leg pain and swelling before that however 2-3 days after that the pain and swelling got to be quite bad especially above the knee. Patient comes in the emergency room with some swelling and redness in the left leg above the knee. There is no warmth there there is some tenderness above the knee not below it. The swelling is worse above-the-knee the redness is about the same all throughout the leg.   Past Medical History  Diagnosis Date  . Collagen vascular disease ([place])     cardiac stents and blockage  . Hypertension   . Status post right knee replacement   . Hx of appendectomy   . Stented coronary artery   . Cancer [facility])     Larynx Cancer 03/2015   patient was read was receiving radiation therapy for his throat cancer  There are no active problems to display for this patient.   Past Surgical History  Procedure Laterality Date  . Appendectomy  1944  . Coronary angioplasty with stent placement  2012  . Tonsillectomy  1952  . Shoulder surgery Left 2006  . Joint replacement Right 2008    knee  . Knee arthroscopy Right 2006  . Cataract extraction w/ intraocular lens  implant, bilateral Bilateral 2011    both eye done in the same year  . Direct laryngoscopy N/A 04/08/2015    Procedure: DIRECT LARYNGOSCOPY;  Surgeon: Clyde Canterbury, MD;  Location: [facility] ORS;  Service: ENT;  Laterality: N/A;    Current Outpatient Rx  Name  Route  Sig  Dispense  Refill  . Alum & Mag Hydroxide-Simeth (MAGIC MOUTHWASH) SOLN   Oral   Take 10 mLs by mouth 4 (four) times daily as needed for mouth pain.          [name] aspirin 81 MG tablet   Oral   Take 81 mg by mouth daily after breakfast.         . azelastine (ASTELIN) 0.1 % nasal spray   Each Nare   Place 1 spray into both nostrils 2 (two) times daily. Use in each nostril as directed         . celecoxib (CELEBREX) 200 MG capsule   Oral   Take 200 mg by mouth every morning.         . cetirizine (ZYRTEC) 10 MG tablet   Oral   Take 10 mg by mouth daily.         [name] CRANBERRY CONCENTRATE PO   Oral   Take 1 tablet by mouth 2 (two) times daily.         [name] dexamethasone (DECADRON) 4 MG tablet   Oral   Take 1 tablet (4 mg total) by mouth 2 (two) times daily with a meal. x7 days; then 1 tablet daily x7 days; then 1 tab every other day until finished.   25 tablet   0   . fluticasone (FLONASE) 50 MCG/ACT nasal spray   Each Nare   Place 2 sprays into both nostrils daily.         [name] HYDROcodone-acetaminophen (HYCET) 7.5-325 mg/15 ml  solution      10 mL's every 4-6 hours as needed for pain   200 mL   0   . lidocaine (XYLOCAINE) 2 % solution   Mouth/Throat   Use as directed 20 mLs in the mouth or throat 3 (three) times daily before meals.   100 mL   2   . LORazepam (ATIVAN) 1 MG tablet   Oral   Take 1 mg by mouth at bedtime.         . lovastatin (MEVACOR) 20 MG tablet   Oral   Take 20 mg by mouth at bedtime.         [name] Lysine 1000 MG TABS   Oral   Take 1 tablet by mouth every morning.         . metoprolol tartrate (LOPRESSOR) 25 MG tablet   Oral   Take 12.5 mg by mouth 2 (two) times daily.         . mirtazapine (REMERON) 15 MG tablet   Oral   Take 7.5 mg by mouth at bedtime as needed (0.5 tablet at bedtime for sleep).          . multivitamin-iron-minerals-folic acid (CENTRUM) chewable tablet   Oral   Chew 1 tablet by mouth every morning.         . pantoprazole (PROTONIX) 40 MG tablet   Oral   Take 40 mg by mouth 2 (two) times daily.         . Probiotic Product (ALIGN PO)   Oral   Take 1 capsule by  mouth every morning.         Clarnce Flock Palmetto-Phytosterols (PROSTATE SR PO)   Oral   Take 1 tablet by mouth 2 (two) times daily.         . sucralfate (CARAFATE) 1 G tablet   Oral   Take 1 tablet (1 g total) by mouth 4 (four) times daily -  with meals and at bedtime.   90 tablet   3   . tamsulosin (FLOMAX) 0.4 MG CAPS capsule   Oral   Take 0.4 mg by mouth 2 (two) times daily.         . valsartan-hydrochlorothiazide (DIOVAN-HCT) 80-12.5 MG per tablet   Oral   Take 1 tablet by mouth every morning. 0.5 tablet in am         . HYDROcodone-acetaminophen (NORCO/VICODIN) 5-325 MG tablet   Oral   Take 1 tablet by mouth every 6 (six) hours as needed for moderate pain.         . predniSONE (STERAPRED UNI-PAK 21 TAB) 10 MG (21) TBPK tablet      Sterapred DS 6 days taper. Take as directed. Patient not taking: Reported on 08/13/2015   21 tablet   0     Allergies Gabapentin  History reviewed. No pertinent family history.  Social History Social History  Substance Use Topics  . Smoking status: Former Smoker    Types: Cigars, Pipe    Quit date: 03/30/1984  . Smokeless tobacco: None  . Alcohol Use: No    Review of Systems Constitutional: No fever/chills Eyes: No visual changes. ENT: No sore throat. Cardiovascular: Denies chest pain. Respiratory: Denies shortness of breath. Gastrointestinal: No abdominal pain.  No nausea, no vomiting.  No diarrhea.  No constipation. Genitourinary: Negative for dysuria. Musculoskeletal: Negative for back pain. Skin: Negative for rash. Neurological: Negative for headaches, focal weakness or numbness.  10-point ROS otherwise negative.  ____________________________________________   PHYSICAL  EXAM:  VITAL SIGNS: ED Triage Vitals  Enc Vitals Group     BP 08/13/15 1448 113/72 mmHg     Pulse Rate 08/13/15 1448 112     Resp 08/13/15 1448 16     Temp 08/13/15 1448 97.5 F (36.4 C)     Temp Source 08/13/15 1448 Oral     SpO2  08/13/15 1448 97 %     Weight 08/13/15 1448 175 lb (79.379 kg)     Height 08/13/15 1448 5\' 8"  (1.727 m)     Head Cir --      Peak Flow --      Pain Score 08/13/15 1448 9     Pain Loc --      Pain Edu? --      Excl. in [place]? --     Constitutional: Alert and oriented. Well appearing and in no acute distress. Eyes: Conjunctivae are normal. PERRL. EOMI. Head: Atraumatic. Nose: No congestion/rhinnorhea. Mouth/Throat: Mucous membranes are moist.  Oropharynx non-erythematous. Neck: No stridor.  Cardiovascular: Normal rate, regular rhythm. Grossly normal heart sounds.  Good peripheral circulation. Respiratory: Normal respiratory effort.  No retractions. Lungs CTAB. Gastrointestinal: Soft and nontender. No distention. No abdominal bruits. No CVA tenderness. Musculoskeletal: Right leg is normal left leg is mildly red from the foot upwards. The 5 day is swollen and tender to palpation. The lower leg is not. Distal pulses are equal bilaterally. Patient is not febrile. Neurologic:  Normal speech and language. No gross focal neurologic deficits are appreciated. No gait instability. Skin:  Skin is warm, dry and intact. No rash noted. Psychiatric: Mood and affect are normal. Speech and behavior are normal.  ____________________________________________   LABS (all labs ordered are listed, but only abnormal results are displayed)  Labs Reviewed  COMPREHENSIVE METABOLIC PANEL - Abnormal; Notable for the following:    Sodium 133 (*)    Glucose, Bld 112 (*)    BUN 68 (*)    Creatinine, Ser 1.82 (*)    Calcium 8.6 (*)    Total Bilirubin 1.5 (*)    GFR calc non Af Amer 31 (*)    GFR calc Af Amer 36 (*)    All other components within normal limits  CBC WITH DIFFERENTIAL/PLATELET - Abnormal; Notable for the following:    WBC 12.6 (*)    RDW 15.8 (*)    Platelets 110 (*)    Neutro Abs 10.9 (*)    Lymphs Abs 0.9 (*)    All other components within normal limits    ____________________________________________  EKG EKG shows normal sinus rhythm rate of 99 normal axis no acute changes  ____________________________________________  RADIOLOGY left-sided DVT shows up on ultrasound radiolog ____________________________________________   PROCEDURES  Review of the labs shows a patient's renal function is deteriorated since the summer. Also his white blood count is up. And he can be 89 and a week. I think the safest thing to do in the in view of all these 3 problems including a DVT is to watch him overnight.  ____________________________________________   INITIAL IMPRESSION / [place] / ED COURSE  Pertinent labs & imaging results that were available during my care of the patient were reviewed by me and considered in my medical decision making (see chart for details).   ____________________________________________   FINAL CLINICAL IMPRESSION(S) / ED DIAGNOSES  Final diagnoses:  DVT (deep venous thrombosis), left  Renal failure  Elevated white blood cell count      Nena Polio,  MD 08/13/15 2010  Nena Polio, MD 08/13/15 2032

## 2015-08-13 NOTE — Progress Notes (Signed)
PHARMACIST - PHYSICIAN ORDER COMMUNICATION  CONCERNING: P&T Medication Policy on Herbal Medications  DESCRIPTION:  This patient's order for:  Cranberry, L-Lysine, and saw palmetto capsules have been noted.  This product(s) is classified as an "herbal" or natural product. Due to a lack of definitive safety studies or FDA approval, nonstandard manufacturing practices, plus the potential risk of unknown drug-drug interactions while on inpatient medications, the Pharmacy and Therapeutics Committee does not permit the use of "herbal" or natural products of this type within [facility].   ACTION TAKEN: The pharmacy department is unable to verify this order at this time. Please reevaluate patient's clinical condition at discharge and address if the herbal or natural product(s) should be resumed at that time.

## 2015-08-14 LAB — BASIC METABOLIC PANEL
ANION GAP: 10 (ref 5–15)
BUN: 65 mg/dL — AB (ref 6–20)
CHLORIDE: 105 mmol/L (ref 101–111)
CO2: 22 mmol/L (ref 22–32)
Calcium: 8.6 mg/dL — ABNORMAL LOW (ref 8.9–10.3)
Creatinine, Ser: 1.38 mg/dL — ABNORMAL HIGH (ref 0.61–1.24)
GFR calc Af Amer: 50 mL/min — ABNORMAL LOW (ref >60)
GFR calc non Af Amer: 43 mL/min — ABNORMAL LOW (ref >60)
GLUCOSE: 115 mg/dL — AB (ref 65–99)
POTASSIUM: 3.7 mmol/L (ref 3.5–5.1)
Sodium: 137 mmol/L (ref 135–145)

## 2015-08-14 LAB — CBC
HEMATOCRIT: 41.1 % (ref 40.0–52.0)
HEMOGLOBIN: 13.6 g/dL (ref 13.0–18.0)
MCH: 28.7 pg (ref 26.0–34.0)
MCHC: 33.1 g/dL (ref 32.0–36.0)
MCV: 86.6 fL (ref 80.0–100.0)
PLATELETS: 103 10*3/uL — AB (ref 150–440)
RBC: 4.75 MIL/uL (ref 4.40–5.90)
RDW: 16 % — ABNORMAL HIGH (ref 11.5–14.5)
WBC: 10.5 10*3/uL (ref 3.8–10.6)

## 2015-08-14 LAB — HEPARIN LEVEL (UNFRACTIONATED)
HEPARIN UNFRACTIONATED: 0.49 [IU]/mL (ref 0.30–0.70)
HEPARIN UNFRACTIONATED: 0.3 [IU]/mL (ref 0.30–0.70)
Heparin Unfractionated: 0.86 IU/mL — ABNORMAL HIGH (ref 0.30–0.70)

## 2015-08-14 MED ORDER — HEPARIN (PORCINE) IN NACL 100-0.45 UNIT/ML-% IJ SOLN
800.0000 [IU]/h | INTRAMUSCULAR | Status: AC
  Administered 2015-08-14: 800 [IU]/h via INTRAVENOUS
  Filled 2015-08-14 (×2): qty 250

## 2015-08-14 NOTE — Progress Notes (Signed)
Initial Nutrition Assessment       INTERVENTION:  Meals and snacks: Cater to pt preferences, discussed importance of high protein foods with pt and family at bedside Medical Nutrition Supplement therapy: If unable to meet nutritional needs will add supplement   NUTRITION DIAGNOSIS:   Unintentional weight loss related to cancer and cancer related treatments as evidenced by per patient/family report.    GOAL:   Patient will meet greater than or equal to 90% of their needs    MONITOR:    (Energy intake, Anthropometric, Electrolyte and renal profile)  REASON FOR ASSESSMENT:   Malnutrition Screening Tool    ASSESSMENT:      Pt admitted with DVT  Past Medical History  Diagnosis Date  . Collagen vascular disease ([place])     cardiac stents and blockage  . Hypertension   . Status post right knee replacement   . Hx of appendectomy   . Stented coronary artery   . Cancer [facility])     Larynx Cancer 03/2015    Current Nutrition: ate most of lunch today  Food/Nutrition-Related History: reports normal intake prior to admission   Scheduled Medications:  . aspirin EC  81 mg Oral QPC breakfast  . azelastine  1 spray Each Nare BID  . fluticasone  2 spray Each Nare Daily  . lidocaine  20 mL Mouth/Throat TID AC  . loratadine  10 mg Oral Daily  . LORazepam  1 mg Oral QHS  . metoprolol tartrate  12.5 mg Oral BID  . multivitamin with minerals  1 tablet Oral [id number]  . pantoprazole  40 mg Oral BID AC  . pravastatin  10 mg Oral q1800  . sodium chloride  3 mL Intravenous Q12H  . sucralfate  1 g Oral TID WC & HS  . tamsulosin  0.4 mg Oral BID    Continuous Medications:  . heparin 800 Units/hr (08/14/15 0645)     Electrolyte/Renal Profile and Glucose Profile:   Recent Labs Lab 08/13/15 1655 08/14/15 0536  NA 133* 137  K 3.9 3.7  CL 102 105  CO2 24 22  BUN 68* 65*  CREATININE 1.82* 1.38*  CALCIUM 8.6* 8.6*  GLUCOSE 112* 115*   Protein Profile:  Recent Labs Lab  08/13/15 1655  ALBUMIN 3.7    Gastrointestinal Profile: Last BM:12/22   Nutrition-Focused Physical Exam Findings: Nutrition-Focused physical exam completed. Findings are no fat depletion, no muscle depletion, and no edema.      Weight Change: 7% wt loss in the last 4 months  Diet Order:  Diet Heart Room service appropriate?: Yes; Fluid consistency:: Thin  Skin:   reviewed    Height:   Ht Readings from Last 1 Encounters:  08/13/15 5\' 8"  (1.727 m)    Weight:   Wt Readings from Last 1 Encounters:  08/13/15 175 lb (79.379 kg)    Ideal Body Weight:     BMI:  Body mass index is 26.61 kg/(m^2).  Estimated Nutritional Needs:   Kcal:  BEE 1434 kcals (IF 1.0-1.3, Af 1.3) [id number] kcals/d  Protein:  (1.0-1.3 g/kg) 80-104 g/d  Fluid:  (30-69ml/kg) 2400-2830ml/d  EDUCATION NEEDS:   No education needs identified at this time  [place] Level  Jocob Dambach B. Zenia Resides, [place], [place] (pager) Weekend/On-Call pager [phone])

## 2015-08-14 NOTE — Progress Notes (Signed)
ANTICOAGULATION CONSULT NOTE - Initial Consult  Pharmacy Consult for Heparin  Indication: DVT  Allergies  Allergen Reactions  . Gabapentin Nausea Only    Patient Measurements: Height: 5\' 8"  (172.7 cm) Weight: 175 lb (79.379 kg) IBW/kg (Calculated) : 68.4 Heparin Dosing Weight: 79.4 kg   Vital Signs: Temp: 98 F (36.7 C) (12/23 0846) Temp Source: Oral (12/23 0846) BP: 111/53 mmHg (12/23 0846) Pulse Rate: 109 (12/23 0846)  Labs:  Recent Labs  08/13/15 1655 08/13/15 2101 08/14/15 0536 08/14/15 1247  HGB 14.0  --  13.6  --   HCT 43.0  --  41.1  --   PLT 110*  --  103*  --   APTT  --  26  --   --   LABPROT  --  14.5  --   --   INR  --  1.11  --   --   HEPARINUNFRC  --   --  0.86* 0.49  CREATININE 1.82*  --  1.38*  --     Estimated Creatinine Clearance: 34.4 mL/min (by C-G formula based on Cr of 1.38).   Medical History: Past Medical History  Diagnosis Date  . Collagen vascular disease ([place])     cardiac stents and blockage  . Hypertension   . Status post right knee replacement   . Hx of appendectomy   . Stented coronary artery   . Cancer [facility])     Larynx Cancer 03/2015    Medications:  Prescriptions prior to admission  Medication Sig Dispense Refill Last Dose  . Alum & Mag Hydroxide-Simeth (MAGIC MOUTHWASH) SOLN Take 10 mLs by mouth 4 (four) times daily as needed for mouth pain.   prn at prn  . aspirin 81 MG tablet Take 81 mg by mouth daily after breakfast.   08/12/2015 at pm  . azelastine (ASTELIN) 0.1 % nasal spray Place 1 spray into both nostrils 2 (two) times daily. Use in each nostril as directed   08/13/2015 at Unknown time  . celecoxib (CELEBREX) 200 MG capsule Take 200 mg by mouth every morning.   08/13/2015 at Unknown time  . cetirizine (ZYRTEC) 10 MG tablet Take 10 mg by mouth daily.   08/13/2015 at am  . CRANBERRY CONCENTRATE PO Take 1 tablet by mouth 2 (two) times daily.   08/13/2015 at Unknown time  . dexamethasone (DECADRON) 4 MG tablet Take 1  tablet (4 mg total) by mouth 2 (two) times daily with a meal. x7 days; then 1 tablet daily x7 days; then 1 tab every other day until finished. 25 tablet 0 08/13/2015 at Unknown time  . fluticasone (FLONASE) 50 MCG/ACT nasal spray Place 2 sprays into both nostrils daily.   08/13/2015 at 1200  . HYDROcodone-acetaminophen (HYCET) 7.5-325 mg/15 ml solution 10 mL's every 4-6 hours as needed for pain 200 mL 0 prn at prn  . lidocaine (XYLOCAINE) 2 % solution Use as directed 20 mLs in the mouth or throat 3 (three) times daily before meals. 100 mL 2 08/13/2015 at Unknown time  . LORazepam (ATIVAN) 1 MG tablet Take 1 mg by mouth at bedtime.   08/12/2015 at Unknown time  . lovastatin (MEVACOR) 20 MG tablet Take 20 mg by mouth at bedtime.   08/12/2015 at Unknown time  . Lysine 1000 MG TABS Take 1 tablet by mouth every morning.   08/13/2015 at am  . metoprolol tartrate (LOPRESSOR) 25 MG tablet Take 12.5 mg by mouth 2 (two) times daily.   08/13/2015 at am  .  mirtazapine (REMERON) 15 MG tablet Take 7.5 mg by mouth at bedtime as needed (0.5 tablet at bedtime for sleep).    08/12/2015 at Unknown time  . multivitamin-iron-minerals-folic acid (CENTRUM) chewable tablet Chew 1 tablet by mouth every morning.   08/13/2015 at Unknown time  . pantoprazole (PROTONIX) 40 MG tablet Take 40 mg by mouth 2 (two) times daily.   08/13/2015 at Unknown time  . Probiotic Product (ALIGN PO) Take 1 capsule by mouth every morning.   08/13/2015 at am  . Saw Palmetto-Phytosterols (PROSTATE SR PO) Take 1 tablet by mouth 2 (two) times daily.   08/13/2015 at Unknown time  . sucralfate (CARAFATE) 1 G tablet Take 1 tablet (1 g total) by mouth 4 (four) times daily -  with meals and at bedtime. 90 tablet 3 08/13/2015 at Unknown time  . tamsulosin (FLOMAX) 0.4 MG CAPS capsule Take 0.4 mg by mouth 2 (two) times daily.   08/13/2015 at Unknown time  . valsartan-hydrochlorothiazide (DIOVAN-HCT) 80-12.5 MG per tablet Take 1 tablet by mouth every morning.  0.5 tablet in am   08/13/2015 at am  . HYDROcodone-acetaminophen (NORCO/VICODIN) 5-325 MG tablet Take 1 tablet by mouth every 6 (six) hours as needed for moderate pain.   Taking  . predniSONE (STERAPRED UNI-PAK 21 TAB) 10 MG (21) TBPK tablet Sterapred DS 6 days taper. Take as directed. (Patient not taking: Reported on 08/13/2015) 21 tablet 0 Completed Course at Unknown time    Assessment: Pharmacy consulted to dose heparin in this 81 year old male admitted with DVT.  Patient currently receiving heparin drip at 800 units/mL.   Goal of Therapy:  Heparin level 0.3-0.7 units/ml   Plan:  Heparin level in goal range with anti-Xa level at 0.49. Will obtain confirmatory anti-Xa level at 2100.   Pharmacy will continue to monitor and adjust per consult.   Latroy Gaymon L, Pharm.D. Clinical Pharmacist 08/14/2015,1:26 PM

## 2015-08-14 NOTE — Progress Notes (Signed)
Pt has not voided all day>8hrs but pt reports has not voided since yesterday. Bladder scan  >521.pt only amble to void 30cc. Hand placed in warm water. rn spoke to dr Bridgett Larsson who stated if pt unable to void. In and out x1.

## 2015-08-14 NOTE — Progress Notes (Signed)
ANTICOAGULATION CONSULT NOTE - Initial Consult  Pharmacy Consult for Heparin  Indication: DVT  Allergies  Allergen Reactions  . Gabapentin Nausea Only    Patient Measurements: Height: 5\' 8"  (172.7 cm) Weight: 175 lb (79.379 kg) IBW/kg (Calculated) : 68.4 Heparin Dosing Weight: 79.4 kg   Vital Signs: Temp: 98.3 F (36.8 C) (12/23 0407) Temp Source: Axillary (12/23 0407) BP: 95/44 mmHg (12/23 0407) Pulse Rate: 92 (12/23 0407)  Labs:  Recent Labs  08/13/15 1655 08/13/15 2101 08/14/15 0536  HGB 14.0  --   --   HCT 43.0  --   --   PLT 110*  --   --   APTT  --  26  --   LABPROT  --  14.5  --   INR  --  1.11  --   HEPARINUNFRC  --   --  0.86*  CREATININE 1.82*  --  1.38*    Estimated Creatinine Clearance: 34.4 mL/min (by C-G formula based on Cr of 1.38).   Medical History: Past Medical History  Diagnosis Date  . [place]     cardiac stents and blockage  . Hypertension   . Status post right knee replacement   . Hx of appendectomy   . Stented coronary artery   . Cancer [facility])     Larynx Cancer 03/2015    Medications:  Prescriptions prior to admission  Medication Sig Dispense Refill Last Dose  . Alum & Mag Hydroxide-Simeth (MAGIC MOUTHWASH) SOLN Take 10 mLs by mouth 4 (four) times daily as needed for mouth pain.   prn at prn  . aspirin 81 MG tablet Take 81 mg by mouth daily after breakfast.   08/12/2015 at pm  . azelastine (ASTELIN) 0.1 % nasal spray Place 1 spray into both nostrils 2 (two) times daily. Use in each nostril as directed   08/13/2015 at Unknown time  . celecoxib (CELEBREX) 200 MG capsule Take 200 mg by mouth every morning.   08/13/2015 at Unknown time  . cetirizine (ZYRTEC) 10 MG tablet Take 10 mg by mouth daily.   08/13/2015 at am  . CRANBERRY CONCENTRATE PO Take 1 tablet by mouth 2 (two) times daily.   08/13/2015 at Unknown time  . dexamethasone (DECADRON) 4 MG tablet Take 1 tablet (4 mg total) by mouth 2 (two) times daily  with a meal. x7 days; then 1 tablet daily x7 days; then 1 tab every other day until finished. 25 tablet 0 08/13/2015 at Unknown time  . fluticasone (FLONASE) 50 MCG/ACT nasal spray Place 2 sprays into both nostrils daily.   08/13/2015 at 1200  . HYDROcodone-acetaminophen (HYCET) 7.5-325 mg/15 ml solution 10 mL's every 4-6 hours as needed for pain 200 mL 0 prn at prn  . lidocaine (XYLOCAINE) 2 % solution Use as directed 20 mLs in the mouth or throat 3 (three) times daily before meals. 100 mL 2 08/13/2015 at Unknown time  . LORazepam (ATIVAN) 1 MG tablet Take 1 mg by mouth at bedtime.   08/12/2015 at Unknown time  . lovastatin (MEVACOR) 20 MG tablet Take 20 mg by mouth at bedtime.   08/12/2015 at Unknown time  . Lysine 1000 MG TABS Take 1 tablet by mouth every morning.   08/13/2015 at am  . metoprolol tartrate (LOPRESSOR) 25 MG tablet Take 12.5 mg by mouth 2 (two) times daily.   08/13/2015 at am  . mirtazapine (REMERON) 15 MG tablet Take 7.5 mg by mouth at bedtime as needed (0.5 tablet at  bedtime for sleep).    08/12/2015 at Unknown time  . multivitamin-iron-minerals-folic acid (CENTRUM) chewable tablet Chew 1 tablet by mouth every morning.   08/13/2015 at Unknown time  . pantoprazole (PROTONIX) 40 MG tablet Take 40 mg by mouth 2 (two) times daily.   08/13/2015 at Unknown time  . Probiotic Product (ALIGN PO) Take 1 capsule by mouth every morning.   08/13/2015 at am  . Saw Palmetto-Phytosterols (PROSTATE SR PO) Take 1 tablet by mouth 2 (two) times daily.   08/13/2015 at Unknown time  . sucralfate (CARAFATE) 1 G tablet Take 1 tablet (1 g total) by mouth 4 (four) times daily -  with meals and at bedtime. 90 tablet 3 08/13/2015 at Unknown time  . tamsulosin (FLOMAX) 0.4 MG CAPS capsule Take 0.4 mg by mouth 2 (two) times daily.   08/13/2015 at Unknown time  . valsartan-hydrochlorothiazide (DIOVAN-HCT) 80-12.5 MG per tablet Take 1 tablet by mouth every morning. 0.5 tablet in am   08/13/2015 at am  .  HYDROcodone-acetaminophen (NORCO/VICODIN) 5-325 MG tablet Take 1 tablet by mouth every 6 (six) hours as needed for moderate pain.   Taking  . predniSONE (STERAPRED UNI-PAK 21 TAB) 10 MG (21) TBPK tablet Sterapred DS 6 days taper. Take as directed. (Patient not taking: Reported on 08/13/2015) 21 tablet 0 Completed Course at Unknown time    Assessment: Pharmacy consulted to dose heparin in this 81 year old male admitted with DVT.  No prior anticoag noted.  Goal of Therapy:  Heparin level 0.3-0.7 units/ml   Plan:  Heparin level supratherapeutic. Spoke with RN, decrease rate to 800 units/hr and ordered recheck in 6 hours.  Laural Benes, Pharm.D., BCPS Clinical Pharmacist 08/14/2015,6:32 AM

## 2015-08-14 NOTE — Progress Notes (Addendum)
Matthew David at Matthew David NAME: Matthew David    MR#:  [id number]  DATE OF BIRTH:  1922-02-18  SUBJECTIVE:  CHIEF COMPLAINT:   Chief Complaint  Patient presents with  . Leg Pain   left leg swelling and tenderness.  REVIEW OF SYSTEMS:  CONSTITUTIONAL: No fever, has weakness.  EYES: No blurred or double vision.  EARS, NOSE, AND THROAT: No tinnitus or ear pain.  RESPIRATORY: No cough, shortness of breath, wheezing or hemoptysis.  CARDIOVASCULAR: No chest pain, orthopnea, edema.  GASTROINTESTINAL: No nausea, vomiting, diarrhea or abdominal pain.  GENITOURINARY: No dysuria, hematuria.  ENDOCRINE: No polyuria, nocturia,  HEMATOLOGY: No anemia, easy bruising or bleeding SKIN: No rash or lesion. MUSCULOSKELETAL:  Left leg swelling and tenderness above the knee. NEUROLOGIC: No tingling, numbness, weakness.  PSYCHIATRY: No anxiety or depression.   DRUG ALLERGIES:   Allergies  Allergen Reactions  . Gabapentin Nausea Only    VITALS:  Blood pressure 108/53, pulse 85, temperature 97.5 F (36.4 C), temperature source Oral, resp. rate 18, height 5\' 8"  (1.727 m), weight 79.379 kg (175 lb), SpO2 96 %.  PHYSICAL EXAMINATION:  GENERAL:  81 y.o.-year-old patient lying in the bed with no acute distress.  EYES: Pupils equal, round, reactive to light and accommodation. No scleral icterus. Extraocular muscles intact.  HEENT: Head atraumatic, normocephalic. Oropharynx and nasopharynx clear.  NECK:  Supple, no jugular venous distention. No thyroid enlargement, no tenderness.  LUNGS: Normal breath sounds bilaterally, no wheezing, rales,rhonchi or crepitation. No use of accessory muscles of respiration.  CARDIOVASCULAR: S1, S2 normal. No murmurs, rubs, or gallops.  ABDOMEN: Soft, nontender, nondistended. Bowel sounds present. No organomegaly or mass.  EXTREMITIES: No pedal edema, cyanosis, or clubbing. Tenderness and swelling of left leg above the  knee.  NEUROLOGIC: Cranial nerves II through XII are intact. Muscle strength 4/5 in all extremities. Sensation intact. Gait not checked.  PSYCHIATRIC: The patient is alert and oriented x 3.  SKIN: No obvious rash, lesion, or ulcer.    LABORATORY PANEL:   CBC  Recent Labs Lab 08/14/15 0536  WBC 10.5  HGB 13.6  HCT 41.1  PLT 103*   ------------------------------------------------------------------------------------------------------------------  Chemistries   Recent Labs Lab 08/13/15 1655 08/14/15 0536  NA 133* 137  K 3.9 3.7  CL 102 105  CO2 24 22  GLUCOSE 112* 115*  BUN 68* 65*  CREATININE 1.82* 1.38*  CALCIUM 8.6* 8.6*  AST 19  --   ALT 30  --   ALKPHOS 47  --   BILITOT 1.5*  --    ------------------------------------------------------------------------------------------------------------------  Cardiac Enzymes No results for input(s): TROPONINI in the last 168 hours. ------------------------------------------------------------------------------------------------------------------  RADIOLOGY:  US Venous Img Lower Unilateral Left  08/13/2015  CLINICAL DATA:  81 year old male with pain and swelling in the left lower extremity EXAM: Left  LOWER EXTREMITY VENOUS DOPPLER ULTRASOUND TECHNIQUE: Gray-scale sonography with graded compression, as well as color Doppler and duplex ultrasound were performed to evaluate the lower extremity deep venous systems from the level of the common femoral vein and including the common femoral, femoral, profunda femoral, popliteal and calf veins including the posterior tibial, peroneal and gastrocnemius veins when visible. The superficial great saphenous vein was also interrogated. Spectral Doppler was utilized to evaluate flow at rest and with distal augmentation maneuvers in the common femoral, femoral and popliteal veins. COMPARISON:  CT of the abdomen pelvis dated 01/30/2015 FINDINGS: Contralateral Common Femoral Vein: Respiratory  phasicity is normal and symmetric  with the symptomatic side. No evidence of thrombus. Normal compressibility. Common Femoral Vein: There is occlusive thrombus in the common femoral vein. Saphenofemoral Junction: Occlusive thrombus. Profunda Femoral Vein: Occlusive thrombus in the deep femoral vein. Femoral Vein: Occlusive thrombus in the proximal portion of the femoral vein. Nonocclusive thrombus in the mid and distal femoral vein. Popliteal Vein: Nonocclusive thrombus in the popliteal vein. Calf Veins: Nonocclusive thrombus in the visualized calf veins all Superficial Great Saphenous Vein: Not visualized. IMPRESSION: Extensive deep venous thrombosis in the left lower extremity with proximal extension to the left common femoral vein. Critical Value/emergent results were called by telephone at the time of interpretation on 08/13/2015 at 6:21 pm to nurse Matthew David who verbally acknowledged these results. Electronically Signed   By: Anner Crete M.D.   On: 08/13/2015 18:24    EKG:   Orders placed or performed during the hospital encounter of 08/13/15  . ED EKG  . ED EKG  . EKG 12-Lead  . EKG 12-Lead    ASSESSMENT AND PLAN:   1. Extensive left lower extremity DVT Continue heparin drip,  will change to eliquis tomorrow.  Per Dr. Lucky David, no indication  thrombectomy for IVC filter placement, anticoagulation for at least 6 months.  elevation of leg, Pain management as needed.  2. Acute kidney injury, improving. Continue gentle hydration with IV fluids and repeat BMP in a.m. Discontinue HCTZ, Avapro and Celebrex due to ARF and low blood pressure.  * Hyponatremia, improved.  3. History of coronary artery disease status post stent placement Currently asymptomatic. Resume his home medications  4. History of laryngeal cancer status post radiation treatment Outpatient follow-up with oncology Matthew David.   thrombocytopenia, possible due to radiation treatment. Stable   All the records are reviewed  and case discussed with Care Management/Social Workerr. Management plans discussed with the patient, his wife and the son, and they are in agreement.  CODE STATUS: Full code  TOTAL TIME TAKING CARE OF THIS PATIENT: 45 minutes.  Greater than 50% time was spent on coordination of care and face-to-face counseling.  POSSIBLE D/C IN 2 DAYS, DEPENDING ON CLINICAL CONDITION.   Demetrios Loll M.D on 08/14/2015 at 3:29 PM  Between 7am to 6pm - Pager - [phone]  After 6pm go to [email or web address] - password [email or web address] [facility]  [facility]  Office  [phone]  CC: Primary care physician; Adin Hector, MD

## 2015-08-14 NOTE — Care Management Note (Signed)
Case Management Note  Patient Details  Name: Matthew David MRN: [id number] Date of Birth: [birth year]  Subjective/Objective:  Admitted with DVT. Patient presents from home where he lives with his wife. He uses a cane but has a walker if needed, independent with adls.  PCP is Ramonita Lab.  Pharmacy is [facility] [phone]). He is aware that he will most likely be discharged on a blood thinner. Vascular consult pending.             Action/Plan:   Expected Discharge Date:                  Expected Discharge Plan:  [name]  In-House Referral:     Discharge planning Services  CM Consult  Post Acute Care Choice:  Home Health Choice offered to:  Patient  DME Arranged:    DME Agency:     HH Arranged:    [place] Agency:     Status of Service:  In process, will continue to follow  Medicare Important Message Given:    Date Medicare IM Given:    Medicare IM give by:    Date Additional Medicare IM Given:    Additional Medicare Important Message give by:     If discussed at [place] of Stay Meetings, dates discussed:    Additional Comments:  Jolly Mango, RN 08/14/2015, 10:58 AM

## 2015-08-14 NOTE — Progress Notes (Signed)
ANTICOAGULATION CONSULT NOTE - Initial Consult  Pharmacy Consult for Heparin  Indication: DVT  Allergies  Allergen Reactions  . Gabapentin Nausea Only    Patient Measurements: Height: 5\' 8"  (172.7 cm) Weight: 175 lb (79.379 kg) IBW/kg (Calculated) : 68.4 Heparin Dosing Weight: 79.4 kg   Vital Signs: Temp: 97.7 F (36.5 C) (12/23 1927) Temp Source: Oral (12/23 1927) BP: 101/41 mmHg (12/23 1927) Pulse Rate: 84 (12/23 1927)  Labs:  Recent Labs  08/13/15 1655 08/13/15 2101 08/14/15 0536 08/14/15 1247 08/14/15 2051  HGB 14.0  --  13.6  --   --   HCT 43.0  --  41.1  --   --   PLT 110*  --  103*  --   --   APTT  --  26  --   --   --   LABPROT  --  14.5  --   --   --   INR  --  1.11  --   --   --   HEPARINUNFRC  --   --  0.86* 0.49 0.30  CREATININE 1.82*  --  1.38*  --   --     Estimated Creatinine Clearance: 34.4 mL/min (by C-G formula based on Cr of 1.38).   Medical History: Past Medical History  Diagnosis Date  . Collagen vascular disease ([place])     cardiac stents and blockage  . Hypertension   . Status post right knee replacement   . Hx of appendectomy   . Stented coronary artery   . Cancer [facility])     Larynx Cancer 03/2015    Medications:  Prescriptions prior to admission  Medication Sig Dispense Refill Last Dose  . Alum & Mag Hydroxide-Simeth (MAGIC MOUTHWASH) SOLN Take 10 mLs by mouth 4 (four) times daily as needed for mouth pain.   prn at prn  . aspirin 81 MG tablet Take 81 mg by mouth daily after breakfast.   08/12/2015 at pm  . azelastine (ASTELIN) 0.1 % nasal spray Place 1 spray into both nostrils 2 (two) times daily. Use in each nostril as directed   08/13/2015 at Unknown time  . celecoxib (CELEBREX) 200 MG capsule Take 200 mg by mouth every morning.   08/13/2015 at Unknown time  . cetirizine (ZYRTEC) 10 MG tablet Take 10 mg by mouth daily.   08/13/2015 at am  . CRANBERRY CONCENTRATE PO Take 1 tablet by mouth 2 (two) times daily.   08/13/2015 at  Unknown time  . dexamethasone (DECADRON) 4 MG tablet Take 1 tablet (4 mg total) by mouth 2 (two) times daily with a meal. x7 days; then 1 tablet daily x7 days; then 1 tab every other day until finished. 25 tablet 0 08/13/2015 at Unknown time  . fluticasone (FLONASE) 50 MCG/ACT nasal spray Place 2 sprays into both nostrils daily.   08/13/2015 at 1200  . HYDROcodone-acetaminophen (HYCET) 7.5-325 mg/15 ml solution 10 mL's every 4-6 hours as needed for pain 200 mL 0 prn at prn  . lidocaine (XYLOCAINE) 2 % solution Use as directed 20 mLs in the mouth or throat 3 (three) times daily before meals. 100 mL 2 08/13/2015 at Unknown time  . LORazepam (ATIVAN) 1 MG tablet Take 1 mg by mouth at bedtime.   08/12/2015 at Unknown time  . lovastatin (MEVACOR) 20 MG tablet Take 20 mg by mouth at bedtime.   08/12/2015 at Unknown time  . Lysine 1000 MG TABS Take 1 tablet by mouth every morning.   08/13/2015 at  am  . metoprolol tartrate (LOPRESSOR) 25 MG tablet Take 12.5 mg by mouth 2 (two) times daily.   08/13/2015 at am  . mirtazapine (REMERON) 15 MG tablet Take 7.5 mg by mouth at bedtime as needed (0.5 tablet at bedtime for sleep).    08/12/2015 at Unknown time  . multivitamin-iron-minerals-folic acid (CENTRUM) chewable tablet Chew 1 tablet by mouth every morning.   08/13/2015 at Unknown time  . pantoprazole (PROTONIX) 40 MG tablet Take 40 mg by mouth 2 (two) times daily.   08/13/2015 at Unknown time  . Probiotic Product (ALIGN PO) Take 1 capsule by mouth every morning.   08/13/2015 at am  . Saw Palmetto-Phytosterols (PROSTATE SR PO) Take 1 tablet by mouth 2 (two) times daily.   08/13/2015 at Unknown time  . sucralfate (CARAFATE) 1 G tablet Take 1 tablet (1 g total) by mouth 4 (four) times daily -  with meals and at bedtime. 90 tablet 3 08/13/2015 at Unknown time  . tamsulosin (FLOMAX) 0.4 MG CAPS capsule Take 0.4 mg by mouth 2 (two) times daily.   08/13/2015 at Unknown time  . valsartan-hydrochlorothiazide  (DIOVAN-HCT) 80-12.5 MG per tablet Take 1 tablet by mouth every morning. 0.5 tablet in am   08/13/2015 at am  . HYDROcodone-acetaminophen (NORCO/VICODIN) 5-325 MG tablet Take 1 tablet by mouth every 6 (six) hours as needed for moderate pain.   Taking  . predniSONE (STERAPRED UNI-PAK 21 TAB) 10 MG (21) TBPK tablet Sterapred DS 6 days taper. Take as directed. (Patient not taking: Reported on 08/13/2015) 21 tablet 0 Completed Course at Unknown time    Assessment: Pharmacy consulted to dose heparin in this 81 year old male admitted with DVT.  Patient currently receiving heparin drip at 800 units/mL.   Goal of Therapy:  Heparin level 0.3-0.7 units/ml   Plan:  Heparin level in goal range with anti-Xa level at 0.03. Will check next anti-Xa with am labs.   Pharmacy will continue to monitor and adjust per consult.    Paulina Fusi, PharmD, BCPS 08/14/2015 9:59 PM

## 2015-08-14 NOTE — Consult Note (Signed)
[facility] Vascular Consult Note  MRN : [id number]  Kaelyn Youngdahl is a 81 y.o. (1921/10/26) male who presents with chief complaint of  Chief Complaint  Patient presents with  . Leg Pain  .  History of Present Illness: Patient is a 81 year old male who presents with about a two-week history of left lower extremity swelling and increasing pain. I am asked by Dr. Margaretmary Eddy from the hospitalist service to see the patient and give my opinion on the treatment of his extensive left lower extremity DVT. The pain and swelling is actually better after the initiation of heparin last night. He is also been elevating his leg which is felt. No trauma or injury to the leg. He reports progressive swelling over about 2 weeks that prompted an emergency room visit last night due to the significant pain and swelling. No right lower extremity symptoms. Has a history of malignancy in the form of laryngeal cancer. Was not on anticoagulation. There were no clear recent inciting events or causative factors that started the symptoms. He had a venous duplex which shows extensive left lower extremity DVT throughout the visualized veins of the left lower extremity.  Current Facility-Administered Medications  Medication Dose Route Frequency Provider Last Rate Last Dose  . acetaminophen (TYLENOL) tablet 650 mg  650 mg Oral Q6H PRN Nicholes Mango, MD       Or  . acetaminophen (TYLENOL) suppository 650 mg  650 mg Rectal Q6H PRN Nicholes Mango, MD      . aspirin EC tablet 81 mg  81 mg Oral QPC breakfast Nicholes Mango, MD   81 mg at 08/14/15 0921  . azelastine (ASTELIN) 0.1 % nasal spray 1 spray  1 spray Each Nare BID Nicholes Mango, MD   1 spray at 08/14/15 0924  . fluticasone (FLONASE) 50 MCG/ACT nasal spray 2 spray  2 spray Each Nare Daily Nicholes Mango, MD   2 spray at 08/14/15 0924  . heparin ADULT infusion 100 units/mL (25000 units/250 mL)  800 Units/hr Intravenous Continuous Nicholes Mango, MD 8 mL/hr at 08/14/15  0645 800 Units/hr at 08/14/15 0645  . HYDROcodone-acetaminophen (NORCO/VICODIN) 5-325 MG per tablet 1 tablet  1 tablet Oral Q6H PRN Aruna Gouru, MD      . lidocaine (XYLOCAINE) 2 % viscous mouth solution 20 mL  20 mL Mouth/Throat TID AC Nicholes Mango, MD   20 mL at 08/14/15 1238  . loratadine (CLARITIN) tablet 10 mg  10 mg Oral Daily Nicholes Mango, MD   10 mg at 08/14/15 0925  . LORazepam (ATIVAN) tablet 1 mg  1 mg Oral QHS Nicholes Mango, MD   1 mg at 08/14/15 0037  . magic mouthwash  10 mL Oral QID PRN Nicholes Mango, MD      . metoprolol tartrate (LOPRESSOR) tablet 12.5 mg  12.5 mg Oral BID Nicholes Mango, MD   12.5 mg at 08/14/15 [id number]  . mirtazapine (REMERON) tablet 7.5 mg  7.5 mg Oral QHS PRN Nicholes Mango, MD      . morphine 2 MG/ML injection 2 mg  2 mg Intravenous Q4H PRN Nicholes Mango, MD      . multivitamin with minerals tablet 1 tablet  1 tablet Oral Rodney Booze Nicholes Mango, MD   1 tablet at 08/14/15 [phone]  . ondansetron (ZOFRAN) tablet 4 mg  4 mg Oral Q6H PRN Nicholes Mango, MD       Or  . ondansetron (ZOFRAN) injection 4 mg  4 mg Intravenous Q6H PRN Nicholes Mango, MD      .  pantoprazole (PROTONIX) EC tablet 40 mg  40 mg Oral BID AC Nicholes Mango, MD   40 mg at 08/14/15 [id number]  . pravastatin (PRAVACHOL) tablet 10 mg  10 mg Oral q1800 Aruna Gouru, MD      . sodium chloride 0.9 % injection 3 mL  3 mL Intravenous Q12H Nicholes Mango, MD   3 mL at 08/14/15 0926  . sucralfate (CARAFATE) tablet 1 g  1 g Oral TID WC & HS Nicholes Mango, MD   1 g at 08/14/15 1238  . tamsulosin (FLOMAX) capsule 0.4 mg  0.4 mg Oral BID Nicholes Mango, MD   0.4 mg at 08/14/15 [id number]    Past Medical History  Diagnosis Date  . Collagen vascular disease ([place])     cardiac stents and blockage  . Hypertension   . Status post right knee replacement   . Hx of appendectomy   . Stented coronary artery   . Cancer [facility])     Larynx Cancer 03/2015    Past Surgical History  Procedure Laterality Date  . Appendectomy  1944  . Coronary angioplasty with stent  placement  2012  . Tonsillectomy  1952  . Shoulder surgery Left 2006  . Joint replacement Right 2008    knee  . Knee arthroscopy Right 2006  . Cataract extraction w/ intraocular lens  implant, bilateral Bilateral 2011    both eye done in the same year  . Direct laryngoscopy N/A 04/08/2015    Procedure: DIRECT LARYNGOSCOPY;  Surgeon: Clyde Canterbury, MD;  Location: [facility] ORS;  Service: ENT;  Laterality: N/A;    Social History Social History  Substance Use Topics  . Smoking status: Former Smoker    Types: Cigars, Pipe    Quit date: 03/30/1984  . Smokeless tobacco: None  . Alcohol Use: No    Family History No known history of bleeding disorders, clotting disorders, or autoimmune diseases  Allergies  Allergen Reactions  . Gabapentin Nausea Only     REVIEW OF SYSTEMS (Negative unless checked)  Constitutional: [] Weight loss  [] Fever  [] Chills Cardiac: [] Chest pain   [] Chest pressure   [] Palpitations   [] Shortness of breath when laying flat   [] Shortness of breath at rest   [] Shortness of breath with exertion. Vascular:  [] Pain in legs with walking   [] Pain in legs at rest   [] Pain in legs when laying flat   [] Claudication   [] Pain in feet when walking  [] Pain in feet at rest  [] Pain in feet when laying flat   [x] History of DVT   [x] Phlebitis   [x] Swelling in legs   [] Varicose veins   [] Non-healing ulcers Pulmonary:   [] Uses home oxygen   [] Productive cough   [] Hemoptysis   [] Wheeze  [] COPD   [] Asthma Neurologic:  [] Dizziness  [] Blackouts   [] Seizures   [] History of stroke   [] History of TIA  [] Aphasia   [] Temporary blindness   [] Dysphagia   [] Weakness or numbness in arms   [] Weakness or numbness in legs Musculoskeletal:  [] Arthritis   [] Joint swelling   [] Joint pain   [] Low back pain Hematologic:  [] Easy bruising  [] Easy bleeding   [] Hypercoagulable state   [] Anemic  [] Hepatitis Gastrointestinal:  [] Blood in stool   [] Vomiting blood  [] Gastroesophageal reflux/heartburn   [] Difficulty  swallowing. Genitourinary:  [x] Chronic kidney disease   [] Difficult urination  [] Frequent urination  [] Burning with urination   [] Blood in urine Skin:  [] Rashes   [] Ulcers   [] Wounds Psychological:  [] History of anxiety   []   History of major depression.  Physical Examination  Filed Vitals:   08/13/15 2130 08/14/15 0016 08/14/15 0407 08/14/15 0846  BP: 115/63 109/57 95/44 111/53  Pulse:  102 92 109  Temp:  98.3 F (36.8 C) 98.3 F (36.8 C) 98 F (36.7 C)  TempSrc:  Oral Axillary Oral  Resp: 15 18 18 18   Height:      Weight:      SpO2:  98% 97% 95%   Body mass index is 26.61 kg/(m^2). Gen:  WD/WN, NAD Head: [place]/AT, No temporalis wasting. Prominent temp pulse not noted. Ear/Nose/Throat: Hearing grossly intact, nares w/o erythema or drainage, oropharynx w/o Erythema/Exudate Eyes: PERRLA, EOMI.  Neck: Supple, no nuchal rigidity.  No JVD.  Pulmonary:  Good air movement, equal bilaterally.  Cardiac: RRR, normal S1, S2, . Vascular:  Vessel Right Left  Radial Palpable Palpable  Ulnar Palpable Palpable  Brachial Palpable Palpable  Carotid Palpable, without bruit Palpable, without bruit  Aorta Not palpable N/A  Femoral Palpable Palpable  Popliteal Palpable Palpable  PT Palpable Not Palpable  DP Palpable Palpable   Gastrointestinal: soft, non-tender/non-distended. No guarding/reflex. No masses, surgical incisions, or scars. Musculoskeletal: M/S 5/5 throughout.  Extremities without ischemic changes.  No deformity or atrophy. 2+ LLE edema. No RLE edema Neurologic: CN 2-12 intact. Pain and light touch intact in extremities.  Symmetrical.  Speech is fluent. Motor exam as listed above. Psychiatric: Judgment intact, Mood & affect appropriate for pt's clinical situation. Dermatologic: No rashes or ulcers noted.  No cellulitis or open wounds. Lymph : No Cervical, Axillary, or Inguinal lymphadenopathy.      CBC Lab Results  Component Value Date   WBC 10.5 08/14/2015   HGB 13.6  08/14/2015   HCT 41.1 08/14/2015   MCV 86.6 08/14/2015   PLT 103* 08/14/2015    BMET    Component Value Date/Time   NA 137 08/14/2015 0536   NA 142 02/28/2014 0510   K 3.7 08/14/2015 0536   K 3.6 02/28/2014 0510   CL 105 08/14/2015 0536   CL 109* 02/28/2014 0510   CO2 22 08/14/2015 0536   CO2 24 02/28/2014 0510   GLUCOSE 115* 08/14/2015 0536   GLUCOSE 95 02/28/2014 0510   BUN 65* 08/14/2015 0536   BUN 23* 02/28/2014 0510   CREATININE 1.38* 08/14/2015 0536   CREATININE 1.38* 02/28/2014 0510   CALCIUM 8.6* 08/14/2015 0536   CALCIUM 8.4* 02/28/2014 0510   GFRNONAA 43* 08/14/2015 0536   GFRNONAA 45* 02/28/2014 0510   GFRAA 50* 08/14/2015 0536   GFRAA 52* 02/28/2014 0510   Estimated Creatinine Clearance: 34.4 mL/min (by C-G formula based on Cr of 1.38).  COAG Lab Results  Component Value Date   INR 1.11 08/13/2015    Radiology Mr Lumbar Spine Wo Contrast  07/21/2015  CLINICAL DATA:  Low back and left hip pain and difficulty walking since 07/17/2015. No known injury. Initial encounter. EXAM: MRI LUMBAR SPINE WITHOUT CONTRAST TECHNIQUE: Multiplanar, multisequence MR imaging of the lumbar spine was performed. No intravenous contrast was administered. COMPARISON:  CT abdomen and pelvis 01/30/2015. MRI lumbar spine 03/06/2015. FINDINGS: There is no fracture. Convex left scoliosis is again seen. Retrolisthesis T12 on L1, L1 on L2 and L2 on L3 is unchanged. Pars interarticularis defects at L5 with associated 0.8 cm in anterolisthesis also unchanged. Conus medullaris is normal in signal and position. Imaged intra-abdominal contents are unremarkable. The T10-11 level is imaged in the sagittal plane only and negative. T11-12: Minimal disc bulge without central canal  or foraminal narrowing. T12-L1: No change in disc and endplate spur eccentrically prominent to the right and the right paravertebral space. Right foraminal narrowing is unchanged. The central canal is mildly narrowed. The left  foramen is open. L1-2: No change in a shallow disc bulge and endplate spur eccentric to the right. Mild to moderate central canal narrowing and right worse than left foraminal narrowing at appear unchanged. L2-3: Facet arthropathy, ligamentum flavum thickening and a disc bulge are unchanged in appearance. Moderately severe central canal narrowing is again seen. Mild left foraminal narrowing is unchanged. The right foramen is open. L3-4: Facet arthropathy, ligamentum flavum thickening and a shallow disc bulge cause moderate to moderately severe central canal narrowing, unchanged. Moderate left foraminal narrowing is identified. The right foramen is open. L4-5: Facet arthropathy is seen. Shallow disc bulge and endplate spur are noted. Mild to moderate central canal narrowing is identified. The foramina are open. The appearance is unchanged. L5-S1: The disc is uncovered without bulging. The central canal is open. Marked bilateral foraminal narrowing, worse on the left, is unchanged. IMPRESSION: Negative for fracture or other acute abnormality. No change in the appearance of the lumbar spine with multilevel spondylosis as detailed above. Electronically Signed   By: Inge Rise M.D.   On: 07/21/2015 13:37   US Venous Img Lower Unilateral Left  08/13/2015  CLINICAL DATA:  81 year old male with pain and swelling in the left lower extremity EXAM: Left  LOWER EXTREMITY VENOUS DOPPLER ULTRASOUND TECHNIQUE: Gray-scale sonography with graded compression, as well as color Doppler and duplex ultrasound were performed to evaluate the lower extremity deep venous systems from the level of the common femoral vein and including the common femoral, femoral, profunda femoral, popliteal and calf veins including the posterior tibial, peroneal and gastrocnemius veins when visible. The superficial great saphenous vein was also interrogated. Spectral Doppler was utilized to evaluate flow at rest and with distal augmentation  maneuvers in the common femoral, femoral and popliteal veins. COMPARISON:  CT of the abdomen pelvis dated 01/30/2015 FINDINGS: Contralateral Common Femoral Vein: Respiratory phasicity is normal and symmetric with the symptomatic side. No evidence of thrombus. Normal compressibility. Common Femoral Vein: There is occlusive thrombus in the common femoral vein. Saphenofemoral Junction: Occlusive thrombus. Profunda Femoral Vein: Occlusive thrombus in the deep femoral vein. Femoral Vein: Occlusive thrombus in the proximal portion of the femoral vein. Nonocclusive thrombus in the mid and distal femoral vein. Popliteal Vein: Nonocclusive thrombus in the popliteal vein. Calf Veins: Nonocclusive thrombus in the visualized calf veins all Superficial Great Saphenous Vein: Not visualized. IMPRESSION: Extensive deep venous thrombosis in the left lower extremity with proximal extension to the left common femoral vein. Critical Value/emergent results were called by telephone at the time of interpretation on 08/13/2015 at 6:21 pm to nurse [place] who verbally acknowledged these results. Electronically Signed   By: Anner Crete M.D.   On: 08/13/2015 18:24     Assessment/Plan 1. Extensive LLE DVT. Has responded well initially to heparin and his leg is less swollen and painful today. Given his advanced age and good response to initial anticoagulation, would not recommend thrombectomy or thrombolysis at this point. No clear indication for IVC filter either. Would recommend leg elevation. Should consider compression stocking as an outpatient to reduce postphlebitic symptoms. Will need at least 6 months of anticoagulation for full therapy. 2. History of laryngeal cancer. Doing well. Has had radiation treatment multiple times. 3. Chronic kidney disease. Mild to moderate. May contribute to leg swelling somewhat  so fluid control is important.   Jonpaul Lumm, MD  08/14/2015 12:59 PM

## 2015-08-14 NOTE — Progress Notes (Addendum)
Pt voided 550cc dark amber urine . Fluids encouraged

## 2015-08-15 LAB — BASIC METABOLIC PANEL
ANION GAP: 9 (ref 5–15)
BUN: 63 mg/dL — ABNORMAL HIGH (ref 6–20)
CALCIUM: 8.4 mg/dL — AB (ref 8.9–10.3)
CO2: 22 mmol/L (ref 22–32)
CREATININE: 1.3 mg/dL — AB (ref 0.61–1.24)
Chloride: 103 mmol/L (ref 101–111)
GFR calc Af Amer: 54 mL/min — ABNORMAL LOW (ref >60)
GFR, EST NON AFRICAN AMERICAN: 47 mL/min — AB (ref >60)
GLUCOSE: 130 mg/dL — AB (ref 65–99)
Potassium: 3.5 mmol/L (ref 3.5–5.1)
Sodium: 134 mmol/L — ABNORMAL LOW (ref 135–145)

## 2015-08-15 LAB — CBC
HCT: 39.1 % — ABNORMAL LOW (ref 40.0–52.0)
HEMOGLOBIN: 13.4 g/dL (ref 13.0–18.0)
MCH: 29.7 pg (ref 26.0–34.0)
MCHC: 34.4 g/dL (ref 32.0–36.0)
MCV: 86.4 fL (ref 80.0–100.0)
Platelets: 96 10*3/uL — ABNORMAL LOW (ref 150–440)
RBC: 4.52 MIL/uL (ref 4.40–5.90)
RDW: 15.9 % — ABNORMAL HIGH (ref 11.5–14.5)
WBC: 10.3 10*3/uL (ref 3.8–10.6)

## 2015-08-15 LAB — HEPARIN LEVEL (UNFRACTIONATED): Heparin Unfractionated: 0.35 IU/mL (ref 0.30–0.70)

## 2015-08-15 MED ORDER — APIXABAN 5 MG PO TABS: 10.0000 mg | ORAL_TABLET | Freq: Two times a day (BID) | ORAL | Status: DC

## 2015-08-15 MED ORDER — APIXABAN 5 MG PO TABS
10.0000 mg | ORAL_TABLET | Freq: Two times a day (BID) | ORAL | Status: DC
  Administered 2015-08-15: 10 mg via ORAL
  Filled 2015-08-15: qty 2

## 2015-08-15 MED ORDER — APIXABAN 5 MG PO TABS: 5.0000 mg | ORAL_TABLET | Freq: Two times a day (BID) | ORAL | Status: DC

## 2015-08-15 NOTE — Progress Notes (Signed)
ANTICOAGULATION CONSULT NOTE - Initial Consult  Pharmacy Consult for Heparin  Indication: DVT  Allergies  Allergen Reactions  . Gabapentin Nausea Only    Patient Measurements: Height: 5\' 8"  (172.7 cm) Weight: 175 lb (79.379 kg) IBW/kg (Calculated) : 68.4 Heparin Dosing Weight: 79.4 kg   Vital Signs: Temp: 97.9 F (36.6 C) (12/24 0402) Temp Source: Oral (12/24 0402) BP: 93/53 mmHg (12/24 0402) Pulse Rate: 85 (12/24 0402)  Labs:  Recent Labs  08/13/15 1655 08/13/15 2101  08/14/15 0536 08/14/15 1247 08/14/15 2051 08/15/15 0406  HGB 14.0  --   --  13.6  --   --  13.4  HCT 43.0  --   --  41.1  --   --  39.1*  PLT 110*  --   --  103*  --   --  96*  APTT  --  26  --   --   --   --   --   LABPROT  --  14.5  --   --   --   --   --   INR  --  1.11  --   --   --   --   --   HEPARINUNFRC  --   --   < > 0.86* 0.49 0.30 0.35  CREATININE 1.82*  --   --  1.38*  --   --   --   < > = values in this interval not displayed.  Estimated Creatinine Clearance: 34.4 mL/min (by C-G formula based on Cr of 1.38).   Medical History: Past Medical History  Diagnosis Date  . Collagen vascular disease ([place])     cardiac stents and blockage  . Hypertension   . Status post right knee replacement   . Hx of appendectomy   . Stented coronary artery   . Cancer [facility])     Larynx Cancer 03/2015    Medications:  Prescriptions prior to admission  Medication Sig Dispense Refill Last Dose  . Alum & Mag Hydroxide-Simeth (MAGIC MOUTHWASH) SOLN Take 10 mLs by mouth 4 (four) times daily as needed for mouth pain.   prn at prn  . aspirin 81 MG tablet Take 81 mg by mouth daily after breakfast.   08/12/2015 at pm  . azelastine (ASTELIN) 0.1 % nasal spray Place 1 spray into both nostrils 2 (two) times daily. Use in each nostril as directed   08/13/2015 at Unknown time  . celecoxib (CELEBREX) 200 MG capsule Take 200 mg by mouth every morning.   08/13/2015 at Unknown time  . cetirizine (ZYRTEC) 10 MG tablet  Take 10 mg by mouth daily.   08/13/2015 at am  . CRANBERRY CONCENTRATE PO Take 1 tablet by mouth 2 (two) times daily.   08/13/2015 at Unknown time  . dexamethasone (DECADRON) 4 MG tablet Take 1 tablet (4 mg total) by mouth 2 (two) times daily with a meal. x7 days; then 1 tablet daily x7 days; then 1 tab every other day until finished. 25 tablet 0 08/13/2015 at Unknown time  . fluticasone (FLONASE) 50 MCG/ACT nasal spray Place 2 sprays into both nostrils daily.   08/13/2015 at 1200  . HYDROcodone-acetaminophen (HYCET) 7.5-325 mg/15 ml solution 10 mL's every 4-6 hours as needed for pain 200 mL 0 prn at prn  . lidocaine (XYLOCAINE) 2 % solution Use as directed 20 mLs in the mouth or throat 3 (three) times daily before meals. 100 mL 2 08/13/2015 at Unknown time  . LORazepam (ATIVAN) 1 MG  tablet Take 1 mg by mouth at bedtime.   08/12/2015 at Unknown time  . lovastatin (MEVACOR) 20 MG tablet Take 20 mg by mouth at bedtime.   08/12/2015 at Unknown time  . Lysine 1000 MG TABS Take 1 tablet by mouth every morning.   08/13/2015 at am  . metoprolol tartrate (LOPRESSOR) 25 MG tablet Take 12.5 mg by mouth 2 (two) times daily.   08/13/2015 at am  . mirtazapine (REMERON) 15 MG tablet Take 7.5 mg by mouth at bedtime as needed (0.5 tablet at bedtime for sleep).    08/12/2015 at Unknown time  . multivitamin-iron-minerals-folic acid (CENTRUM) chewable tablet Chew 1 tablet by mouth every morning.   08/13/2015 at Unknown time  . pantoprazole (PROTONIX) 40 MG tablet Take 40 mg by mouth 2 (two) times daily.   08/13/2015 at Unknown time  . Probiotic Product (ALIGN PO) Take 1 capsule by mouth every morning.   08/13/2015 at am  . Saw Palmetto-Phytosterols (PROSTATE SR PO) Take 1 tablet by mouth 2 (two) times daily.   08/13/2015 at Unknown time  . sucralfate (CARAFATE) 1 G tablet Take 1 tablet (1 g total) by mouth 4 (four) times daily -  with meals and at bedtime. 90 tablet 3 08/13/2015 at Unknown time  . tamsulosin (FLOMAX)  0.4 MG CAPS capsule Take 0.4 mg by mouth 2 (two) times daily.   08/13/2015 at Unknown time  . valsartan-hydrochlorothiazide (DIOVAN-HCT) 80-12.5 MG per tablet Take 1 tablet by mouth every morning. 0.5 tablet in am   08/13/2015 at am  . HYDROcodone-acetaminophen (NORCO/VICODIN) 5-325 MG tablet Take 1 tablet by mouth every 6 (six) hours as needed for moderate pain.   Taking  . predniSONE (STERAPRED UNI-PAK 21 TAB) 10 MG (21) TBPK tablet Sterapred DS 6 days taper. Take as directed. (Patient not taking: Reported on 08/13/2015) 21 tablet 0 Completed Course at Unknown time    Assessment: Pharmacy consulted to dose heparin in this 81 year old male admitted with DVT.  Patient currently receiving heparin drip at 800 units/mL.   Goal of Therapy:  Heparin level 0.3-0.7 units/ml   Plan:  Heparin level therapeutic. Continue current rate. Pharmacy will monitor daily.  Paulina Fusi, Pharm.D., BCPS Clinical Pharmacist 08/15/2015 4:32 AM

## 2015-08-15 NOTE — Care Management Note (Signed)
Case Management Note  Patient Details  Name: Zayan Seppala MRN: [id number] Date of Birth: 1922/03/27  Subjective/Objective:    Provided Mr Detoro with an Eliquis coupon. Mr Staple reports that he is returning to his home address after hospital discharge. Discussed home health providers with Mr Zuhlke who was very noncommital but agreed upon [facility] for RN and PT services. A referral was faxed to [facility] requesting home health RN and Pt services.                 Action/Plan:   Expected Discharge Date:                  Expected Discharge Plan:  [place]  In-House Referral:     Discharge planning Services  CM Consult  Post Acute Care Choice:  Home Health Choice offered to:  Patient  DME Arranged:    DME Agency:     HH Arranged:    [place] Agency:     Status of Service:  In process, will continue to follow  Medicare Important Message Given:    Date Medicare IM Given:    Medicare IM give by:    Date Additional Medicare IM Given:    Additional Medicare Important Message give by:     If discussed at [place] of Stay Meetings, dates discussed:    Additional Comments:  Vayla Wilhelmi A, RN 08/15/2015, 1:31 PM

## 2015-08-15 NOTE — Discharge Instructions (Signed)
Heart healthy diet. °Activity as tolerated. °HHPT. °

## 2015-08-15 NOTE — Progress Notes (Signed)
Patient BP 90/48 HR 96, per Dr. Bridgett Larsson hold metoprolol this am. Patient is asymptomatic, resting in bed. Took all meds with no problems this am. Matthew David

## 2015-08-15 NOTE — Progress Notes (Signed)
ANTICOAGULATION CONSULT NOTE - Initial Consult  Pharmacy Consult for Apixaban  Indication: DVT  Allergies  Allergen Reactions  . Gabapentin Nausea Only    Patient Measurements: Height: 5\' 8"  (172.7 cm) Weight: 175 lb (79.379 kg) IBW/kg (Calculated) : 68.4 Heparin Dosing Weight:   Vital Signs: Temp: 98 F (36.7 C) (12/24 0738) Temp Source: Oral (12/24 0738) BP: 112/52 mmHg (12/24 1038) Pulse Rate: 98 (12/24 1038)  Labs:  Recent Labs  08/13/15 1655 08/13/15 2101  08/14/15 0536 08/14/15 1247 08/14/15 2051 08/15/15 0406  HGB 14.0  --   --  13.6  --   --  13.4  HCT 43.0  --   --  41.1  --   --  39.1*  PLT 110*  --   --  103*  --   --  96*  APTT  --  26  --   --   --   --   --   LABPROT  --  14.5  --   --   --   --   --   INR  --  1.11  --   --   --   --   --   HEPARINUNFRC  --   --   < > 0.86* 0.49 0.30 0.35  CREATININE 1.82*  --   --  1.38*  --   --  1.30*  < > = values in this interval not displayed.  Estimated Creatinine Clearance: 36.5 mL/min (by C-G formula based on Cr of 1.3).   Medical History: Past Medical History  Diagnosis Date  . Collagen vascular disease ([place])     cardiac stents and blockage  . Hypertension   . Status post right knee replacement   . Hx of appendectomy   . Stented coronary artery   . Cancer [facility])     Larynx Cancer 03/2015    Medications:  Scheduled:  . apixaban  10 mg Oral BID  . [START ON 08/22/2015] apixaban  5 mg Oral BID  . aspirin EC  81 mg Oral QPC breakfast  . azelastine  1 spray Each Nare BID  . fluticasone  2 spray Each Nare Daily  . lidocaine  20 mL Mouth/Throat TID AC  . loratadine  10 mg Oral Daily  . LORazepam  1 mg Oral QHS  . multivitamin with minerals  1 tablet Oral [id number]  . pantoprazole  40 mg Oral BID AC  . pravastatin  10 mg Oral q1800  . sodium chloride  3 mL Intravenous Q12H  . sucralfate  1 g Oral TID WC & HS  . tamsulosin  0.4 mg Oral BID    Assessment: Pharmacy consulted to dose apixaban in  this 81 year old male admitted with DVT.  Previously treated with Heparin gtt.    Goal of Therapy:  resolution of DVT    Plan:  Will schedule Heparin gtt to stop on 12/24 @ 11:30. Will order Eliquis 10 mg PO BID X 7 D to start on 12/24 @ 11:30. Will order Eliquis 5 mg PO BID to start 12/31 @ 10:00.   Tameah Mihalko D 08/15/2015,11:23 AM

## 2015-08-15 NOTE — Discharge Summary (Signed)
[facility] [facility] [place] NAME: Matthew David    MR#:  [id number]  DATE OF BIRTH:  02-Jan-1922  DATE OF ADMISSION:  08/13/2015 ADMITTING PHYSICIAN: Nicholes Mango, MD  DATE OF DISCHARGE: 08/15/2015 PRIMARY CARE PHYSICIAN: Tama High III, MD    ADMISSION DIAGNOSIS:  Renal failure [N19] Elevated white blood cell count [D72.829] Swelling of left lower extremity [M79.89] DVT (deep venous thrombosis), left [I82.402] Post procedure discomfort [G89.18]   DISCHARGE DIAGNOSIS:   Extensive left lower extremity DVT Acute kidney injury SECONDARY DIAGNOSIS:   Past Medical History  Diagnosis Date  . Collagen vascular disease ([place])     cardiac stents and blockage  . Hypertension   . Status post right knee replacement   . Hx of appendectomy   . Stented coronary artery   . Cancer [facility])     Larynx Cancer 03/2015    HOSPITAL COURSE:   1. Extensive left lower extremity DVT He has been treated with heparin drip, and change to eliquis today.  Per Dr. Lucky Cowboy, no indication thrombectomy for IVC filter placement, anticoagulation for at least 6 months.  elevation of leg, Pain management as needed.  2. Acute kidney injury, improving. Treated with gentle hydration with IV fluids and follow-up BMP as outpatient. Discontinued HCTZ, Avapro and Celebrex due to ARF and low blood pressure.  * Hyponatremia, improved.  3. History of coronary artery disease status post stent placement Currently asymptomatic. Resumed his home medications  4. History of laryngeal cancer status post radiation treatment Outpatient follow-up with oncology Dr.Brahmandy.  thrombocytopenia, possible due to radiation treatment. Stable   DISCHARGE CONDITIONS:   Stable, discharge to home with home health and PT today.  CONSULTS OBTAINED:  Treatment Team:  Algernon Huxley, MD Katha Cabal, MD  DRUG ALLERGIES:   Allergies  Allergen Reactions  . Gabapentin Nausea  Only    DISCHARGE MEDICATIONS:   Current Discharge Medication List    START taking these medications   Details  !! apixaban (ELIQUIS) 5 MG TABS tablet Take 1 tablet (5 mg total) by mouth 2 (two) times daily. Qty: 60 tablet, Refills: 0    !! apixaban (ELIQUIS) 5 MG TABS tablet Take 2 tablets (10 mg total) by mouth 2 (two) times daily. Qty: 13 tablet, Refills: 0     !! - Potential duplicate medications found. Please discuss with provider.    CONTINUE these medications which have NOT CHANGED   Details  Alum & Mag Hydroxide-Simeth (MAGIC MOUTHWASH) SOLN Take 10 mLs by mouth 4 (four) times daily as needed for mouth pain.    aspirin 81 MG tablet Take 81 mg by mouth daily after breakfast.    azelastine (ASTELIN) 0.1 % nasal spray Place 1 spray into both nostrils 2 (two) times daily. Use in each nostril as directed    cetirizine (ZYRTEC) 10 MG tablet Take 10 mg by mouth daily.    CRANBERRY CONCENTRATE PO Take 1 tablet by mouth 2 (two) times daily.    fluticasone (FLONASE) 50 MCG/ACT nasal spray Place 2 sprays into both nostrils daily.    lidocaine (XYLOCAINE) 2 % solution Use as directed 20 mLs in the mouth or throat 3 (three) times daily before meals. Qty: 100 mL, Refills: 2    LORazepam (ATIVAN) 1 MG tablet Take 1 mg by mouth at bedtime.    lovastatin (MEVACOR) 20 MG tablet Take 20 mg by mouth at bedtime.    Lysine 1000 MG TABS Take 1  tablet by mouth every morning.    metoprolol tartrate (LOPRESSOR) 25 MG tablet Take 12.5 mg by mouth 2 (two) times daily.    mirtazapine (REMERON) 15 MG tablet Take 7.5 mg by mouth at bedtime as needed (0.5 tablet at bedtime for sleep).     multivitamin-iron-minerals-folic acid (CENTRUM) chewable tablet Chew 1 tablet by mouth every morning.    pantoprazole (PROTONIX) 40 MG tablet Take 40 mg by mouth 2 (two) times daily.    Probiotic Product (ALIGN PO) Take 1 capsule by mouth every morning.    Saw Palmetto-Phytosterols (PROSTATE SR PO) Take 1  tablet by mouth 2 (two) times daily.    sucralfate (CARAFATE) 1 G tablet Take 1 tablet (1 g total) by mouth 4 (four) times daily -  with meals and at bedtime. Qty: 90 tablet, Refills: 3    tamsulosin (FLOMAX) 0.4 MG CAPS capsule Take 0.4 mg by mouth 2 (two) times daily.    HYDROcodone-acetaminophen (NORCO/VICODIN) 5-325 MG tablet Take 1 tablet by mouth every 6 (six) hours as needed for moderate pain.      STOP taking these medications     celecoxib (CELEBREX) 200 MG capsule      dexamethasone (DECADRON) 4 MG tablet      HYDROcodone-acetaminophen (HYCET) 7.5-325 mg/15 ml solution      valsartan-hydrochlorothiazide (DIOVAN-HCT) 80-12.5 MG per tablet      predniSONE (STERAPRED UNI-PAK 21 TAB) 10 MG (21) TBPK tablet          DISCHARGE INSTRUCTIONS:    If you experience worsening of your admission symptoms, develop shortness of breath, life threatening emergency, suicidal or homicidal thoughts you must seek medical attention immediately by calling 911 or calling your MD immediately  if symptoms less severe.  You Must read complete instructions/literature along with all the possible adverse reactions/side effects for all the Medicines you take and that have been prescribed to you. Take any new Medicines after you have completely understood and accept all the possible adverse reactions/side effects.   Please note  You were cared for by a hospitalist during your hospital stay. If you have any questions about your discharge medications or the care you received while you were in the hospital after you are discharged, you can call the unit and asked to speak with the hospitalist on call if the hospitalist that took care of you is not available. Once you are discharged, your primary care physician will handle any further medical issues. Please note that NO REFILLS for any discharge medications will be authorized once you are discharged, as it is imperative that you return to your primary care  physician (or establish a relationship with a primary care physician if you do not have one) for your aftercare needs so that they can reassess your need for medications and monitor your lab values.    Today   SUBJECTIVE   Weakness, much better left leg swelling and tenderness. No medicine no bloody stool or hematuria.   VITAL SIGNS:  Blood pressure 118/62, pulse 112, temperature 97.3 F (36.3 C), temperature source Oral, resp. rate 16, height 5\' 8"  (1.727 m), weight 79.379 kg (175 lb), SpO2 97 %.  I/O:   Intake/Output Summary (Last 24 hours) at 08/15/15 1506 Last data filed at 08/15/15 1200  Gross per 24 hour  Intake    240 ml  Output    550 ml  Net   -310 ml    PHYSICAL EXAMINATION:  GENERAL:  81 y.o.-year-old patient lying in the  bed with no acute distress.  EYES: Pupils equal, round, reactive to light and accommodation. No scleral icterus. Extraocular muscles intact.  HEENT: Head atraumatic, normocephalic. Oropharynx and nasopharynx clear.  NECK:  Supple, no jugular venous distention. No thyroid enlargement, no tenderness.  LUNGS: Normal breath sounds bilaterally, no wheezing, rales,rhonchi or crepitation. No use of accessory muscles of respiration.  CARDIOVASCULAR: S1, S2 normal. No murmurs, rubs, or gallops.  ABDOMEN: Soft, non-tender, non-distended. Bowel sounds present. No organomegaly or mass.  EXTREMITIES: better left leg swelling and tenderness, no cyanosis, or clubbing.  NEUROLOGIC: Cranial nerves II through XII are intact. Muscle strength 5/5 in all extremities. Sensation intact. Gait not checked.  PSYCHIATRIC: The patient is alert and oriented x 3.  SKIN: No obvious rash, lesion, or ulcer.   DATA REVIEW:   CBC  Recent Labs Lab 08/15/15 0406  WBC 10.3  HGB 13.4  HCT 39.1*  PLT 96*    Chemistries   Recent Labs Lab 08/13/15 1655  08/15/15 0406  NA 133*  < > 134*  K 3.9  < > 3.5  CL 102  < > 103  CO2 24  < > 22  GLUCOSE 112*  < > 130*  BUN 68*   < > 63*  CREATININE 1.82*  < > 1.30*  CALCIUM 8.6*  < > 8.4*  AST 19  --   --   ALT 30  --   --   ALKPHOS 47  --   --   BILITOT 1.5*  --   --   < > = values in this interval not displayed.  Cardiac Enzymes No results for input(s): TROPONINI in the last 168 hours.  Microbiology Results  Results for orders placed or performed in visit on 02/27/14  Culture, blood (single)     Status: None   Collection Time: 02/27/14  6:20 AM  Result Value Ref Range Status   Micro Text Report   Final       SOURCE: left arm    COMMENT                   NO GROWTH AEROBICALLY/ANAEROBICALLY IN 5 DAYS   ANTIBIOTIC                                                      Culture, blood (single)     Status: None   Collection Time: 02/27/14  6:24 AM  Result Value Ref Range Status   Micro Text Report   Final       SOURCE: right hand    COMMENT                   NO GROWTH AEROBICALLY/ANAEROBICALLY IN 5 DAYS   ANTIBIOTIC                                                        RADIOLOGY:  US Venous Img Lower Unilateral Left  08/13/2015  CLINICAL DATA:  81 year old male with pain and swelling in the left lower extremity EXAM: Left  LOWER EXTREMITY VENOUS DOPPLER ULTRASOUND TECHNIQUE: Gray-scale sonography with graded compression, as well as color Doppler and duplex ultrasound  were performed to evaluate the lower extremity deep venous systems from the level of the common femoral vein and including the common femoral, femoral, profunda femoral, popliteal and calf veins including the posterior tibial, peroneal and gastrocnemius veins when visible. The superficial great saphenous vein was also interrogated. Spectral Doppler was utilized to evaluate flow at rest and with distal augmentation maneuvers in the common femoral, femoral and popliteal veins. COMPARISON:  CT of the abdomen pelvis dated 01/30/2015 FINDINGS: Contralateral Common Femoral Vein: Respiratory phasicity is normal and symmetric with the symptomatic  side. No evidence of thrombus. Normal compressibility. Common Femoral Vein: There is occlusive thrombus in the common femoral vein. Saphenofemoral Junction: Occlusive thrombus. Profunda Femoral Vein: Occlusive thrombus in the deep femoral vein. Femoral Vein: Occlusive thrombus in the proximal portion of the femoral vein. Nonocclusive thrombus in the mid and distal femoral vein. Popliteal Vein: Nonocclusive thrombus in the popliteal vein. Calf Veins: Nonocclusive thrombus in the visualized calf veins all Superficial Great Saphenous Vein: Not visualized. IMPRESSION: Extensive deep venous thrombosis in the left lower extremity with proximal extension to the left common femoral vein. Critical Value/emergent results were called by telephone at the time of interpretation on 08/13/2015 at 6:21 pm to nurse Strang who verbally acknowledged these results. Electronically Signed   By: Anner Crete M.D.   On: 08/13/2015 18:24        Management plans discussed with the patient, family and they are in agreement.  CODE STATUS:     Code Status Orders        Start     Ordered   08/13/15 2301  Full code   Continuous     08/13/15 2301    Advance Directive Documentation        Most Recent Value   Type of Advance Directive  Healthcare Power of Attorney, Living will   Pre-existing out of facility DNR order (yellow form or pink MOST form)     "MOST" Form in Place?        TOTAL TIME TAKING CARE OF THIS PATIENT: 37 minutes.    Demetrios Loll M.D on 08/15/2015 at 3:06 PM  Between 7am to 6pm - Pager - [phone]  After 6pm go to [email or web address] - password [email or web address] [facility]  [facility]  Office  [phone]  CC: Primary care physician; Adin Hector, MD

## 2015-08-15 NOTE — Progress Notes (Signed)
Patient d/c'd home with home health. Education provided, no questions at this time. Patient to be picked up by son. Telemetry removed. Wilnette Kales

## 2015-08-15 NOTE — Progress Notes (Signed)
Dr Bridgett Larsson re: low bp 90/48 . Spoke with dr Bridgett Larsson verbally who stated bp is improved  And he will discharge today.rn requested primary rn to go check bp manually

## 2015-08-15 NOTE — Evaluation (Signed)
Physical Therapy Evaluation Patient Details Name: Matthew David MRN: [id number] DOB: 02/25/1922 Today's Date: 08/15/2015   History of Present Illness  Pt had L LE swelling and redness the turned out to be a DVT.  Clinical Impression  Pt is eager to get up and show that he can go home. Pt still has some L LE swelling from DVT with mild weakness but is able to move and ambulate w/o safety issues and though he normally only needs a cane he does need a walker today from general weakness/stiffness from being in bed.  Pt should be able to return to his normal, active lifestyle with some HHPT.    Follow Up Recommendations Home health PT    Equipment Recommendations       Recommendations for Other Services       Precautions / Restrictions Precautions Precautions: Fall Restrictions Weight Bearing Restrictions: No      Mobility  Bed Mobility Overal bed mobility: Independent                Transfers Overall transfer level: Modified independent Equipment used: Rolling walker (2 wheeled)             General transfer comment: Pt does not need physical assist to rise to standing, but has definite need of walker for balance initially  Ambulation/Gait Ambulation/Gait assistance: Min guard Ambulation Distance (Feet): 75 Feet Assistive device: Rolling walker (2 wheeled)       General Gait Details: Pt with slower gait than his baseline and does appear to use the walker considerably, but he is safe and relatively confident with ambualtion using walker.   Stairs            Wheelchair Mobility    Modified Rankin (Stroke Patients Only)       Balance                                             Pertinent Vitals/Pain Pain Assessment: No/denies pain    Home Living Family/patient expects to be discharged to:: Private residence Living Arrangements: Spouse/significant other Available Help at Discharge: Family Type of Home: House Home Access:  [place]: Environmental consultant - 2 wheels;Cane - single point      Prior Function Level of Independence: Independent         Comments: Pt driving, running errands daily and generally able to be very active     Hand Dominance        Extremity/Trunk Assessment   Upper Extremity Assessment: Overall WFL for tasks assessed           Lower Extremity Assessment: Overall WFL for tasks assessed (L LE functional but weaker than R)         Communication   Communication: No difficulties  Cognition Arousal/Alertness: Awake/alert Behavior During Therapy: WFL for tasks assessed/performed Overall Cognitive Status: Within Functional Limits for tasks assessed                      General Comments      Exercises        Assessment/Plan    PT Assessment Patient needs continued PT services  PT Diagnosis Difficulty walking;Generalized weakness   PT Problem List Decreased strength;Decreased activity tolerance;Decreased balance;Decreased mobility;Decreased cognition;Decreased knowledge of use of DME;Decreased safety awareness  PT Treatment Interventions Gait training;DME instruction;Functional  mobility training;Therapeutic activities;Therapeutic exercise;Balance training;Neuromuscular re-education   PT Goals (Current goals can be found in the Care Plan section) Acute Rehab PT Goals Patient Stated Goal: "I hope you say I can go home, becuase I"m going home anyway" PT Goal Formulation: With patient Time For Goal Achievement: 08/29/15 Potential to Achieve Goals: Good    Frequency Min 2X/week   Barriers to discharge        Co-evaluation               End of Session Equipment Utilized During Treatment: Gait belt Activity Tolerance: Patient tolerated treatment well Patient left: in bed;with family/visitor present           Time: [id number] PT Time Calculation (min) (ACUTE ONLY): 18 min   Charges:   PT Evaluation $Initial PT Evaluation  Tier I: 1 Procedure     PT G Codes:       [name], PT, DPT [phone]  Kreg Shropshire 08/15/2015, 2:34 PM

## 2015-08-18 ENCOUNTER — Other Ambulatory Visit: Payer: Self-pay | Admitting: *Deleted

## 2015-08-18 MED ORDER — DEXAMETHASONE 4 MG PO TABS: 4.0000 mg | ORAL_TABLET | Freq: Every day | ORAL | Status: DC

## 2015-09-09 ENCOUNTER — Ambulatory Visit: Payer: Medicare Other | Attending: Radiation Oncology | Admitting: Radiation Oncology

## 2015-09-14 IMAGING — CT CT NECK W/ CM
3 of 5 series · 13 of 33 positions shown, 16 images · IV contrast (omnipaque)
Comparison: No comparison neck CT.

CLINICAL DATA: [AGE] male with progressive hoarseness over
the past month. Diagnosed with laryngeal cancer last month. Pre
radiation therapy. History of high blood pressure. Initial
encounter.

EXAM:
CT NECK WITH CONTRAST
TECHNIQUE: Multidetector CT imaging of the neck was performed using the
standard protocol following the bolus administration of intravenous
contrast.
CONTRAST:  75mL OMNIPAQUE IOHEXOL 300 MG/ML  SOLN

[Series 4: sag neck · sagittal · 0.40mm/px · 5 of 101 slices shown, 6 images]
[im 34/101  bone]
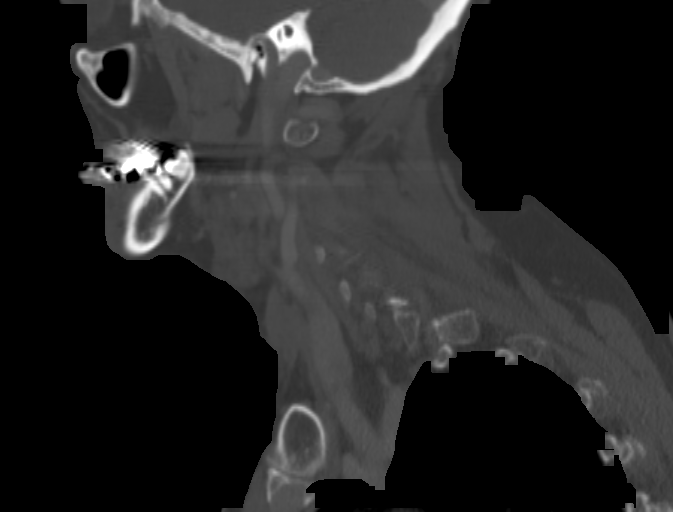
[im 42/101  bone]
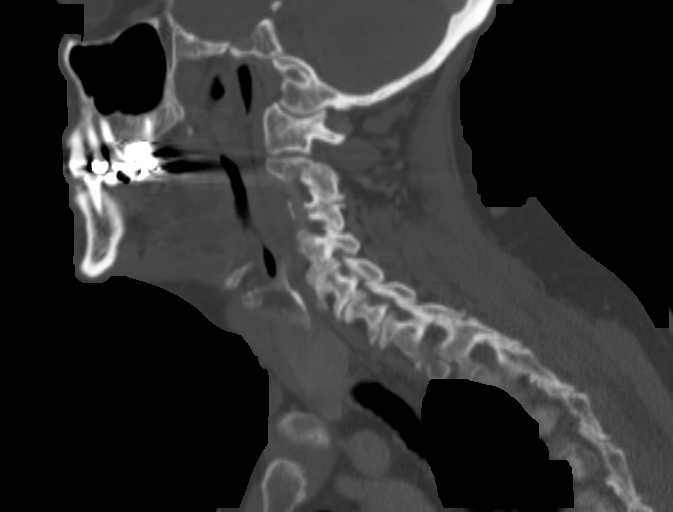
[im 51/101  soft-tissue]
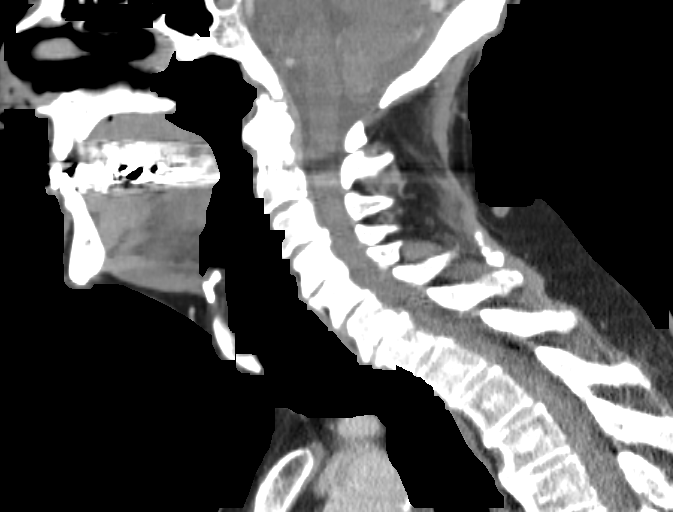
[im 51/101  bone]
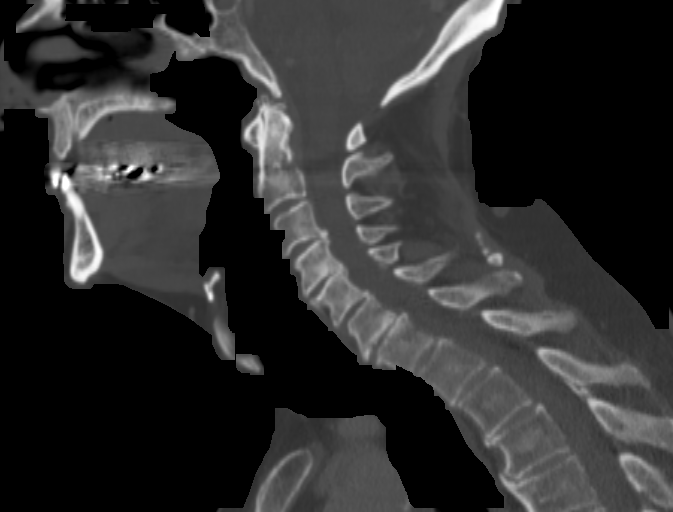
[im 59/101  bone]
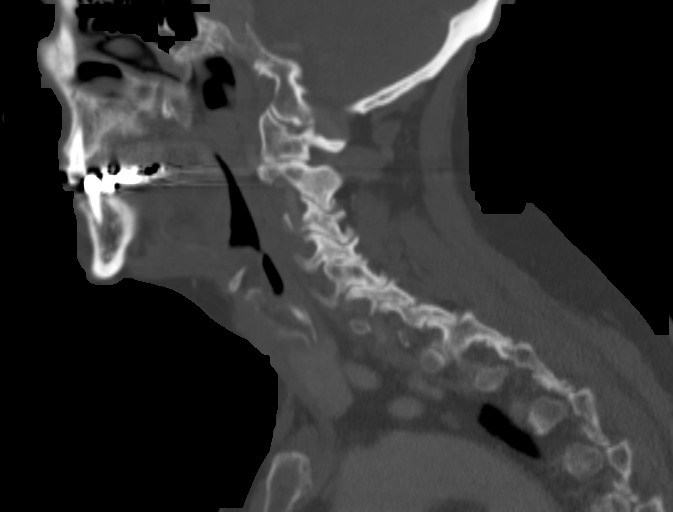
[im 67/101  bone]
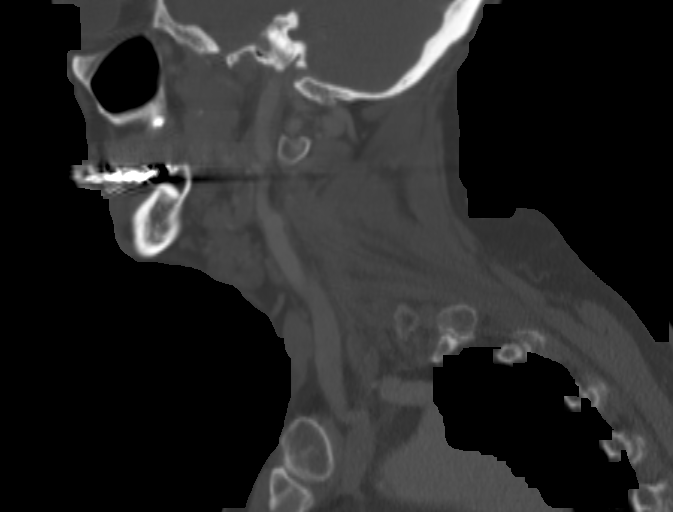

[Series 5: cor neck · coronal · 0.39mm/px · 3 of 113 slices shown]
[im 27/113  bone]
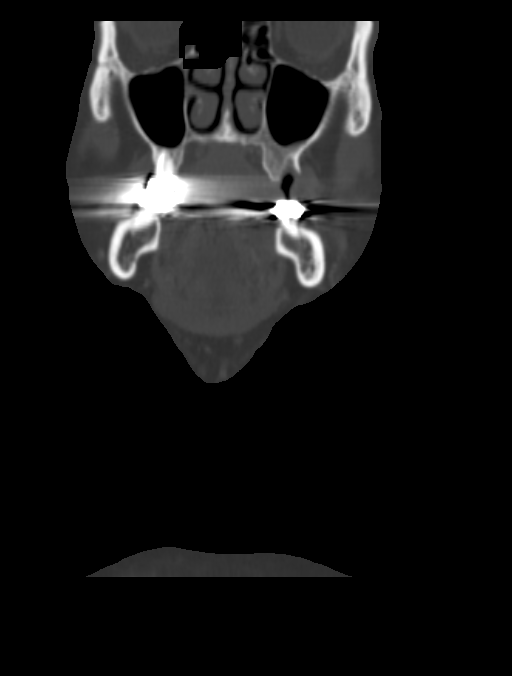
[im 47/113  bone]
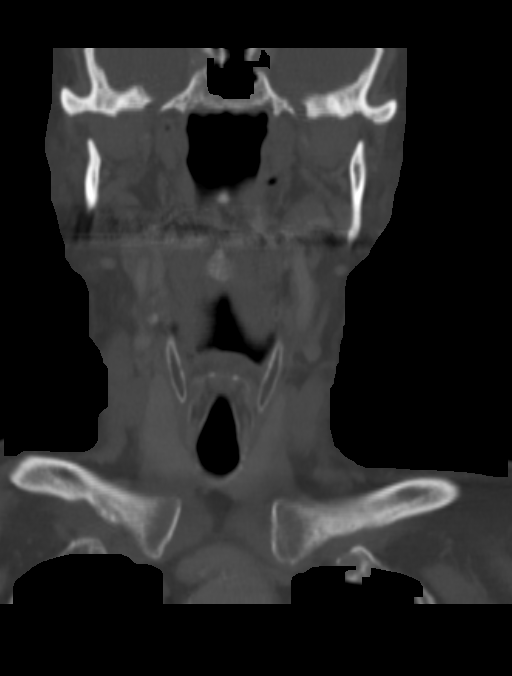
[im 66/113  bone]
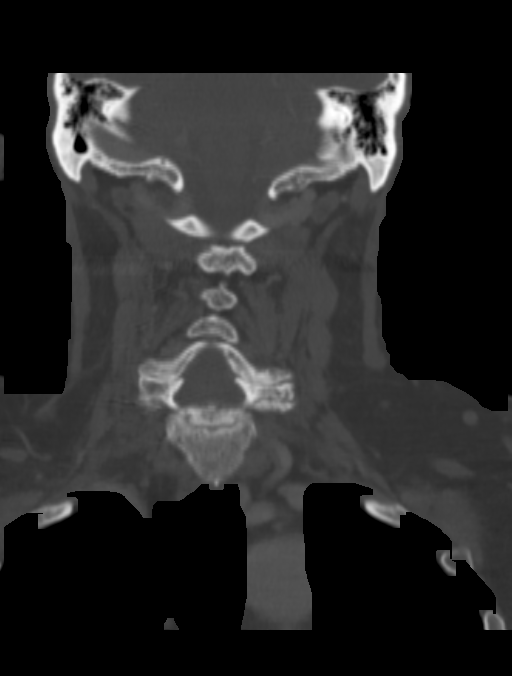

[Series 6: ax oropharynx · axial · 0.39mm/px · z∈[-599,-425]mm · 5 of 137 slices shown, 7 images]
[im 23/137  soft-tissue]
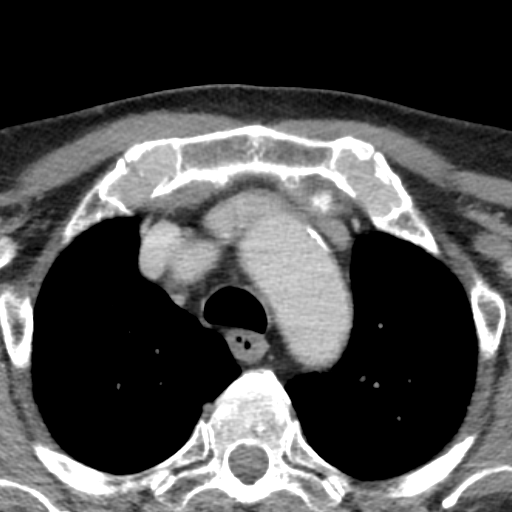
[im 23/137  bone]
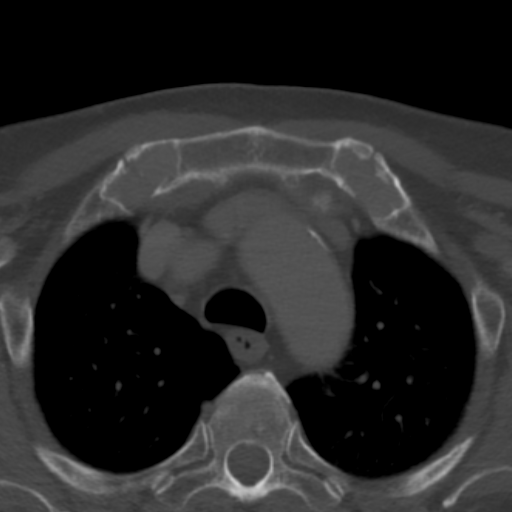
[im 46/137  bone]
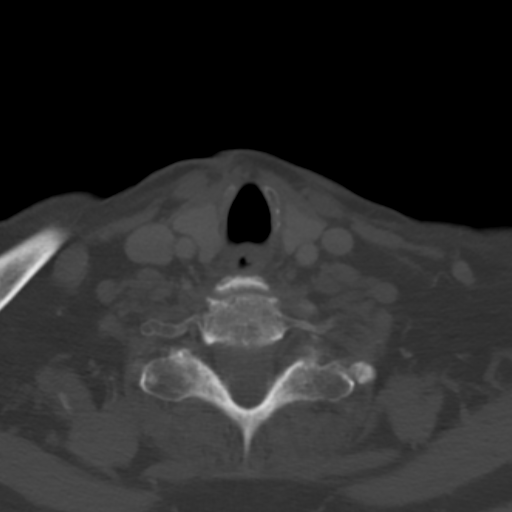
[im 69/137  bone]
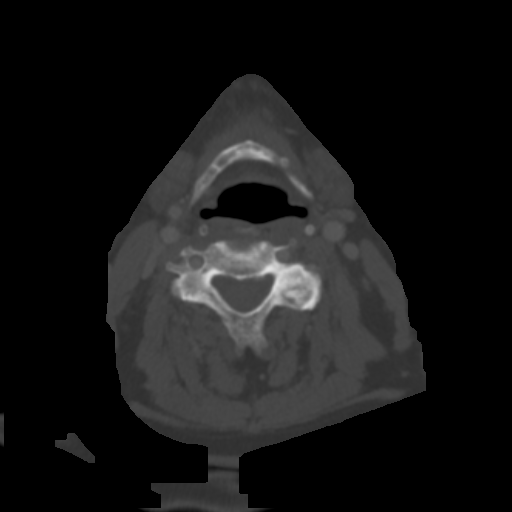
[im 91/137  bone]
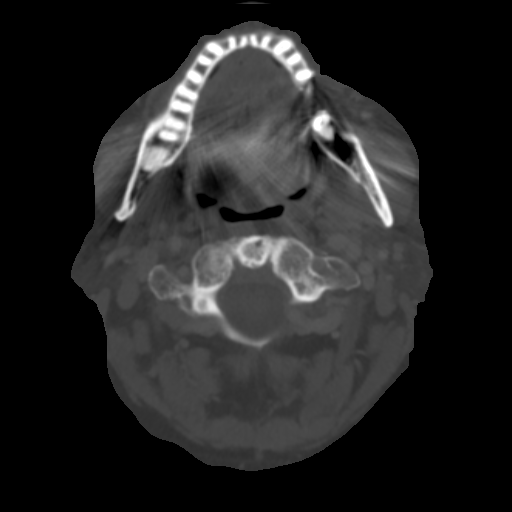
[im 114/137  soft-tissue]
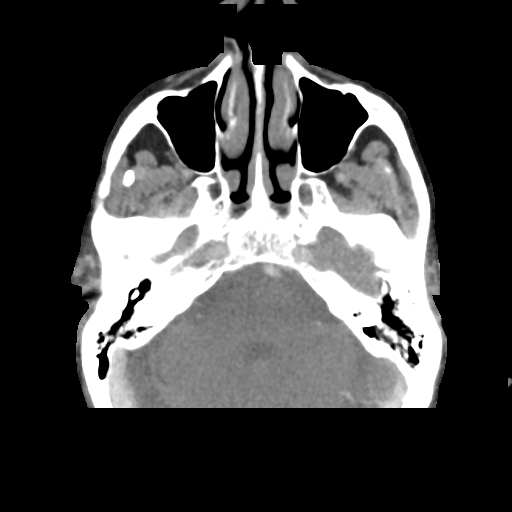
[im 114/137  bone]
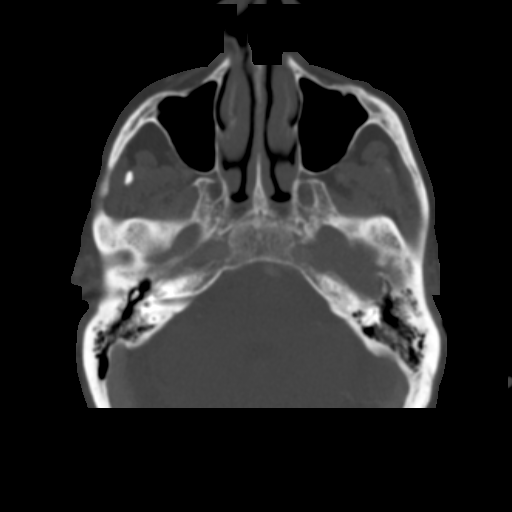

[13 of 33 positions shown; findings below may reference images not displayed]

FINDINGS: Pharynx and larynx: Slight nodularity / fullness right true vocal
cord consistent with patient's known primary malignancy with
extension to the anterior commissure without crossing to left of
midline. No extension into the strap muscles.

Salivary glands: Negative

Thyroid: Negative

Lymph nodes: No adenopathy.  No evidence of necrotic lymph nodes.

Vascular: Atherosclerotic type changes with ectasia of all
visualized arterial structures. Carotid bifurcation plaque without
hemodynamically significant stenosis.

Limited intracranial: Negative.

Visualized orbits: Negative

Mastoids and visualized paranasal sinuses: Clear

Skeleton: Cervical spondylotic changes most notable C6-7

Upper chest: No worrisome lesion.
IMPRESSION: Slight nodularity / fullness right true vocal cord consistent with
patient's known primary malignancy with extension to the anterior
commissure without crossing to left of midline. No extension into
the strap muscles. No adenopathy or necrotic lymph nodes.

## 2015-10-27 ENCOUNTER — Other Ambulatory Visit: Payer: Self-pay | Admitting: Radiation Oncology

## 2015-10-27 ENCOUNTER — Other Ambulatory Visit: Payer: Self-pay | Admitting: *Deleted

## 2015-10-29 ENCOUNTER — Other Ambulatory Visit: Payer: Self-pay | Admitting: *Deleted

## 2015-12-02 ENCOUNTER — Other Ambulatory Visit: Payer: Self-pay | Admitting: Otolaryngology

## 2015-12-02 DIAGNOSIS — R591 Generalized enlarged lymph nodes: Secondary | ICD-10-CM

## 2015-12-02 DIAGNOSIS — R599 Enlarged lymph nodes, unspecified: Secondary | ICD-10-CM

## 2015-12-09 ENCOUNTER — Ambulatory Visit
Admission: RE | Admit: 2015-12-09 | Discharge: 2015-12-09 | Disposition: A | Discharge status: Home / Self Care | Payer: Medicare Other | Source: Ambulatory Visit | Attending: Otolaryngology | Admitting: Otolaryngology

## 2015-12-09 DIAGNOSIS — Z923 Personal history of irradiation: Secondary | ICD-10-CM | POA: Insufficient documentation

## 2015-12-09 DIAGNOSIS — R599 Enlarged lymph nodes, unspecified: Secondary | ICD-10-CM | POA: Diagnosis not present

## 2015-12-09 DIAGNOSIS — R591 Generalized enlarged lymph nodes: Secondary | ICD-10-CM

## 2015-12-09 MED ORDER — IOPAMIDOL (ISOVUE-300) INJECTION 61%
75.0000 mL | Freq: Once | INTRAVENOUS | Status: AC | PRN
Start: 2015-12-09 — End: 2015-12-09
  Administered 2015-12-09: 75 mL via INTRAVENOUS

## 2015-12-28 ENCOUNTER — Emergency Department
Admission: EM | Admit: 2015-12-28 | Discharge: 2015-12-28 | Disposition: A | Discharge status: Home / Self Care | Payer: Medicare Other | Attending: Emergency Medicine | Admitting: Emergency Medicine

## 2015-12-28 ENCOUNTER — Encounter: Payer: Self-pay | Admitting: Emergency Medicine

## 2015-12-28 ENCOUNTER — Emergency Department: Payer: Medicare Other

## 2015-12-28 DIAGNOSIS — Z8521 Personal history of malignant neoplasm of larynx: Secondary | ICD-10-CM | POA: Insufficient documentation

## 2015-12-28 DIAGNOSIS — Z96651 Presence of right artificial knee joint: Secondary | ICD-10-CM | POA: Insufficient documentation

## 2015-12-28 DIAGNOSIS — I82402 Acute embolism and thrombosis of unspecified deep veins of left lower extremity: Secondary | ICD-10-CM | POA: Insufficient documentation

## 2015-12-28 DIAGNOSIS — Z87891 Personal history of nicotine dependence: Secondary | ICD-10-CM | POA: Diagnosis not present

## 2015-12-28 DIAGNOSIS — Z7982 Long term (current) use of aspirin: Secondary | ICD-10-CM | POA: Insufficient documentation

## 2015-12-28 DIAGNOSIS — Z79899 Other long term (current) drug therapy: Secondary | ICD-10-CM | POA: Diagnosis not present

## 2015-12-28 DIAGNOSIS — I10 Essential (primary) hypertension: Secondary | ICD-10-CM | POA: Insufficient documentation

## 2015-12-28 DIAGNOSIS — I82502 Chronic embolism and thrombosis of unspecified deep veins of left lower extremity: Secondary | ICD-10-CM

## 2015-12-28 DIAGNOSIS — M79605 Pain in left leg: Secondary | ICD-10-CM | POA: Diagnosis present

## 2015-12-28 HISTORY — DX: Acute embolism and thrombosis of unspecified deep veins of unspecified lower extremity: I82.409

## 2015-12-28 NOTE — Discharge Instructions (Signed)
We believe that you do not have any new blood clots in your leg at this time.  Please continue taking your medications and call Dr. Bunnie Domino office in the morning for the next available appointment.  Dr. Delana Meyer said that they should be able to see you in the clinic within the next couple of days and certainly by Friday.  If you develop any new or worsening symptoms that concern you, please return to the emergency department.   Deep Vein Thrombosis A deep vein thrombosis (DVT) is a blood clot (thrombus) that usually occurs in a deep, larger vein of the lower leg or the pelvis, or in an upper extremity such as the arm. These are dangerous and can lead to serious and even life-threatening complications if the clot travels to the lungs. A DVT can damage the valves in your leg veins so that instead of flowing upward, the blood pools in the lower leg. This is called post-thrombotic syndrome, and it can result in pain, swelling, discoloration, and sores on the leg. CAUSES A DVT is caused by the formation of a blood clot in your leg, pelvis, or arm. Usually, several things contribute to the formation of blood clots. A clot may develop when:  Your blood flow slows down.  Your vein becomes damaged in some way.  You have a condition that makes your blood clot more easily. RISK FACTORS A DVT is more likely to develop in:  People who are older, especially over 32 years of age.  People who are overweight (obese).  People who sit or lie still for a long time, such as during long-distance travel (over 4 hours), bed rest, hospitalization, or during recovery from certain medical conditions like a stroke.  People who do not engage in much physical activity (sedentary lifestyle).  People who have chronic breathing disorders.  People who have a personal or family history of blood clots or blood clotting disease.  People who have peripheral vascular disease (PVD), diabetes, or some types of cancer.  People  who have heart disease, especially if the person had a recent heart attack or has congestive heart failure.  People who have neurological diseases that affect the legs (leg paresis).  People who have had a traumatic injury, such as breaking a hip or leg.  People who have recently had major or lengthy surgery, especially on the hip, knee, or abdomen.  People who have had a central line placed inside a large vein.  People who take medicines that contain the hormone estrogen. These include birth control pills and hormone replacement therapy.  Pregnancy or during childbirth or the postpartum period.  Long plane flights (over 8 hours). SIGNS AND SYMPTOMS Symptoms of a DVT can include:   Swelling of your leg or arm, especially if one side is much worse.  Warmth and redness of your leg or arm, especially if one side is much worse.  Pain in your arm or leg. If the clot is in your leg, symptoms may be more noticeable or worse when you stand or walk.  A feeling of pins and needles, if the clot is in the arm. The symptoms of a DVT that has traveled to the lungs (pulmonary embolism, PE) usually start suddenly and include:  Shortness of breath while active or at rest.  Coughing or coughing up blood or blood-tinged mucus.  Chest pain that is often worse with deep breaths.  Rapid or irregular heartbeat.  Feeling light-headed or dizzy.  Fainting.  Feeling anxious.  Sweating. There may also be pain and swelling in a leg if that is where the blood clot started. These symptoms may represent a serious problem that is an emergency. Do not wait to see if the symptoms will go away. Get medical help right away. Call your local emergency services (911 in the [place] Do not drive yourself to the hospital. DIAGNOSIS Your health care provider will take a medical history and perform a physical exam. You may also have other tests, including:  Blood tests to assess the clotting properties of your  blood.  Imaging tests, such as CT, ultrasound, MRI, X-ray, and other tests to see if you have clots anywhere in your body. TREATMENT After a DVT is identified, it can be treated. The type of treatment that you receive depends on many factors, such as the cause of your DVT, your risk for bleeding or developing more clots, and other medical conditions that you have. Sometimes, a combination of treatments is necessary. Treatment options may be combined and include:  Monitoring the blood clot with ultrasound.  Taking medicines by mouth, such as newer blood thinners (anticoagulants), thrombolytics, or warfarin.  Taking anticoagulant medicine by injection or through an IV tube.  Wearing compression stockings or using different types ofdevices.  Surgery (rare) to remove the blood clot or to place a filter in your abdomen to stop the blood clot from traveling to your lungs. Treatments for a DVT are often divided into immediate treatment and long-term treatment (up to 3 months after DVT). You can work with your health care provider to choose the treatment program that is best for you. HOME CARE INSTRUCTIONS If you are taking a newer oral anticoagulant:  Take the medicine every single day at the same time each day.  Understand what foods and drugs interact with this medicine.  Understand that there are no regular blood tests required when using this medicine.  Understand the side effects of this medicine, including excessive bruising or bleeding. Ask your health care provider or pharmacist about other possible side effects. If you are taking warfarin:  Understand how to take warfarin and know which foods can affect how warfarin works in Veterinary surgeon.  Understand that it is dangerous to take too much or too little warfarin. Too much warfarin increases the risk of bleeding. Too little warfarin continues to allow the risk for blood clots.  Follow your PT and INR blood testing schedule. The PT and  INR results allow your health care provider to adjust your dose of warfarin. It is very important that you have your PT and INR tested as often as told by your health care provider.  Avoid major changes in your diet, or tell your health care provider before you change your diet. Arrange a visit with a registered dietitian to answer your questions. Many foods, especially foods that are high in vitamin K, can interfere with warfarin and affect the PT and INR results. Eat a consistent amount of foods that are high in vitamin K, such as:  Spinach, kale, broccoli, cabbage, collard greens, turnip greens, Brussels sprouts, peas, cauliflower, seaweed, and parsley.  Beef liver and pork liver.  Green tea.  Soybean oil.  Tell your health care provider about any and all medicines, vitamins, and supplements that you take, including aspirin and other over-the-counter anti-inflammatory medicines. Be especially cautious with aspirin and anti-inflammatory medicines. Do not take those before you ask your health care provider if it is safe to do so. This is important  because many medicines can interfere with warfarin and affect the PT and INR results.  Do not start or stop taking any over-the-counter or prescription medicine unless your health care provider or pharmacist tells you to do so. If you take warfarin, you will also need to do these things:  Hold pressure over cuts for longer than usual.  Tell your dentist and other health care providers that you are taking warfarin before you have any procedures in which bleeding may occur.  Avoid alcohol or drink very small amounts. Tell your health care provider if you change your alcohol intake.  Do not use tobacco products, including cigarettes, chewing tobacco, and e-cigarettes. If you need help quitting, ask your health care provider.  Avoid contact sports. General Instructions  Take over-the-counter and prescription medicines only as told by your health  care provider. Anticoagulant medicines can have side effects, including easy bruising and difficulty stopping bleeding. If you are prescribed an anticoagulant, you will also need to do these things:  Hold pressure over cuts for longer than usual.  Tell your dentist and other health care providers that you are taking anticoagulants before you have any procedures in which bleeding may occur.  Avoid contact sports.  Wear a medical alert bracelet or carry a medical alert card that says you have had a PE.  Ask your health care provider how soon you can go back to your normal activities. Stay active to prevent new blood clots from forming.  Make sure to exercise while traveling or when you have been sitting or standing for a long period of time. It is very important to exercise. Exercise your legs by walking or by tightening and relaxing your leg muscles often. Take frequent walks.  Wear compression stockings as told by your health care provider to help prevent more blood clots from forming.  Do not use tobacco products, including cigarettes, chewing tobacco, and e-cigarettes. If you need help quitting, ask your health care provider.  Keep all follow-up appointments with your health care provider. This is important. PREVENTION Take these actions to decrease your risk of developing another DVT:  Exercise regularly. For at least 30 minutes every day, engage in:  Activity that involves moving your arms and legs.  Activity that encourages good blood flow through your body by increasing your heart rate.  Exercise your arms and legs every hour during long-distance travel (over 4 hours). Drink plenty of water and avoid drinking alcohol while traveling.  Avoid sitting or lying in bed for long periods of time without moving your legs.  Maintain a weight that is appropriate for your height. Ask your health care provider what weight is healthy for you.  If you are a woman who is over 3 years of  age, avoid unnecessary use of medicines that contain estrogen. These include birth control pills.  Do not smoke, especially if you take estrogen medicines. If you need help quitting, ask your health care provider. If you are hospitalized, prevention measures may include:  Early walking after surgery, as soon as your health care provider says that it is safe.  Receiving anticoagulants to prevent blood clots.If you cannot take anticoagulants, other options may be available, such as wearing compression stockings or using different types of devices. SEEK IMMEDIATE MEDICAL CARE IF:  You have new or increased pain, swelling, or redness in an arm or leg.  You have numbness or tingling in an arm or leg.  You have shortness of breath while active or at  rest.  You have chest pain.  You have a rapid or irregular heartbeat.  You feel light-headed or dizzy.  You cough up blood.  You notice blood in your vomit, bowel movement, or urine. These symptoms may represent a serious problem that is an emergency. Do not wait to see if the symptoms will go away. Get medical help right away. Call your local emergency services (911 in the [place] Do not drive yourself to the hospital.   This information is not intended to replace advice given to you by your health care provider. Make sure you discuss any questions you have with your health care provider.   Document Released: 08/08/2005 Document Revised: 04/29/2015 Document Reviewed: 12/03/2014 Elsevier Interactive Patient Education [employer].

## 2015-12-28 NOTE — ED Provider Notes (Signed)
[facility] Emergency Department Provider Note  ____________________________________________  Time seen: Approximately 8:20 PM  I have reviewed the triage vital signs and the nursing notes.   HISTORY  Chief Complaint Leg Pain    HPI Matthew David is a 82 y.o. male with a history of extensive venous clots in his left lower extremity that were discovered about 5 months ago.  He was started on Eliquis and he sees Dr. Lucky Cowboy with vascular surgery.He states that his leg is constantly sore and aching since discovering the clots, but to day he had acute onset of sharp and stabbing pain in the lateral thigh.  Nothing in particular makes it better or worse, the pains are moderate in intensity and brief.  They happen multiple times each hour but they are brief and when they occur.  They do not radiate.  He has chest pain, shortness of breath, nausea, vomiting, diarrhea.  He is able to ambulate like normal with his cane.  He reports being compliant with all his medications.  Even though he is 34 he is very sharp, alert and oriented, and has the capacity to care for himself.   Past Medical History  Diagnosis Date  . Hypertension   . Status post right knee replacement   . Hx of appendectomy   . Stented coronary artery   . 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  [name] DVT (deep venous thrombosis) [facility])     Patient Active Problem List   Diagnosis Date Noted  . Left leg DVT ([place]) 08/13/2015  . [place] 08/13/2015    Past Surgical History  Procedure Laterality Date  . Appendectomy  1944  . Coronary angioplasty with stent placement  2012  . Tonsillectomy  1952  . Shoulder surgery Left 2006  . Joint replacement Right 2008    knee  . Knee arthroscopy Right 2006  . Cataract extraction w/ intraocular lens  implant, bilateral Bilateral 2011    both eye done in the same year  . Direct laryngoscopy N/A 04/08/2015    Procedure: DIRECT LARYNGOSCOPY;  Surgeon: Clyde Canterbury, MD;  Location: [facility] ORS;  Service: ENT;  Laterality: N/A;    Current Outpatient Rx  Name  Route  Sig  Dispense  Refill  . Alum & Mag Hydroxide-Simeth (MAGIC MOUTHWASH) SOLN   Oral   Take 10 mLs by mouth 4 (four) times daily as needed for mouth pain.         [name] apixaban (ELIQUIS) 5 MG TABS tablet   Oral   Take 1 tablet (5 mg total) by mouth 2 (two) times daily.   60 tablet   0   . apixaban (ELIQUIS) 5 MG TABS tablet   Oral   Take 2 tablets (10 mg total) by mouth 2 (two) times daily.   13 tablet   0   . aspirin 81 MG tablet   Oral   Take 81 mg by mouth daily after breakfast.         . azelastine (ASTELIN) 0.1 % nasal spray   Each Nare   Place 1 spray into both nostrils 2 (two) times daily. Use in each nostril as directed         . cetirizine (ZYRTEC) 10 MG tablet   Oral   Take 10 mg by mouth daily.         [name] CRANBERRY CONCENTRATE PO   Oral   Take 1 tablet by mouth 2 (two) times daily.         [name]  dexamethasone (DECADRON) 4 MG tablet   Oral   Take 1 tablet (4 mg total) by mouth daily.   5 tablet   0   . fluticasone (FLONASE) 50 MCG/ACT nasal spray   Each Nare   Place 2 sprays into both nostrils daily.         [name] HYDROcodone-acetaminophen (NORCO/VICODIN) 5-325 MG tablet   Oral   Take 1 tablet by mouth every 6 (six) hours as needed for moderate pain.         [name] lidocaine (XYLOCAINE) 2 % solution      USE AS DIRECTED 20ML BY MOUTH OR THROAT 3 TIMES DAILY BEFORE MEALS   200 mL   6   . LORazepam (ATIVAN) 1 MG tablet   Oral   Take 1 mg by mouth at bedtime.         . lovastatin (MEVACOR) 20 MG tablet   Oral   Take 20 mg by mouth at bedtime.         [name] Lysine 1000 MG TABS   Oral   Take 1 tablet by mouth every morning.         . metoprolol tartrate (LOPRESSOR) 25 MG tablet   Oral   Take 12.5 mg by mouth 2 (two) times daily.         . mirtazapine (REMERON) 15 MG tablet   Oral   Take 7.5 mg by mouth at bedtime as needed (0.5 tablet  at bedtime for sleep).          . multivitamin-iron-minerals-folic acid (CENTRUM) chewable tablet   Oral   Chew 1 tablet by mouth every morning.         . pantoprazole (PROTONIX) 40 MG tablet   Oral   Take 40 mg by mouth 2 (two) times daily.         . Probiotic Product (ALIGN PO)   Oral   Take 1 capsule by mouth every morning.         Clarnce Flock Palmetto-Phytosterols (PROSTATE SR PO)   Oral   Take 1 tablet by mouth 2 (two) times daily.         . sucralfate (CARAFATE) 1 G tablet   Oral   Take 1 tablet (1 g total) by mouth 4 (four) times daily -  with meals and at bedtime.   90 tablet   3   . tamsulosin (FLOMAX) 0.4 MG CAPS capsule   Oral   Take 0.4 mg by mouth 2 (two) times daily.           Allergies Gabapentin  No family history on file.  Social History Social History  Substance Use Topics  . Smoking status: Former Smoker    Types: Cigars, Pipe    Quit date: 03/30/1984  . Smokeless tobacco: None  . Alcohol Use: No    Review of Systems Constitutional: No fever/chills Eyes: No visual changes. ENT: No sore throat. Cardiovascular: Denies chest pain. Respiratory: Denies shortness of breath. Gastrointestinal: No abdominal pain.  No nausea, no vomiting.  No diarrhea.  No constipation. Genitourinary: Negative for dysuria. Musculoskeletal: Negative for back pain. Pain in lateral aspect of left thigh. Skin: Negative for rash. Neurological: Negative for headaches, focal weakness or numbness.  10-point ROS otherwise negative.  ____________________________________________   PHYSICAL EXAM:  VITAL SIGNS: ED Triage Vitals  Enc Vitals Group     BP 12/28/15 1742 105/60 mmHg     Pulse Rate 12/28/15 1742 66     Resp 12/28/15  1742 20     Temp 12/28/15 1742 97.9 F (36.6 C)     Temp Source 12/28/15 1742 Oral     SpO2 12/28/15 1742 94 %     Weight 12/28/15 1742 180 lb (81.647 kg)     Height 12/28/15 1742 5\' 8"  (1.727 m)     Head Cir --      Peak Flow --        Pain Score 12/28/15 1743 8     Pain Loc --      Pain Edu? --      Excl. in [place]? --     Constitutional: Alert and oriented. Well appearing and in no acute distress. Eyes: Conjunctivae are normal. PERRL. EOMI. Head: Atraumatic. Nose: No congestion/rhinnorhea. Mouth/Throat: Mucous membranes are moist.  Oropharynx non-erythematous. Neck: No stridor.  No meningeal signs.   Cardiovascular: Normal rate, regular rhythm. Good peripheral circulation. Grossly normal heart sounds.   Respiratory: Normal respiratory effort.  No retractions. Lungs CTAB. Gastrointestinal: Soft and nontender. No distention.  Musculoskeletal: No lower extremity tenderness nor edema. No gross deformities of extremities. Neurologic:  Normal speech and language. No gross focal neurologic deficits are appreciated.  Skin:  Skin is warm, dry and intact. No rash noted. Psychiatric: Mood and affect are normal. Speech and behavior are normal.  ____________________________________________   LABS (all labs ordered are listed, but only abnormal results are displayed)  Labs Reviewed - No data to display ____________________________________________  EKG  None ____________________________________________  RADIOLOGY   US Venous Img Lower Unilateral Left  12/28/2015  CLINICAL DATA:  82 year old male with history of DVT and left lower extremity presenting with left leg pain. EXAM: Left LOWER EXTREMITY VENOUS DOPPLER ULTRASOUND TECHNIQUE: Gray-scale sonography with graded compression, as well as color Doppler and duplex ultrasound were performed to evaluate the lower extremity deep venous systems from the level of the common femoral vein and including the common femoral, femoral, profunda femoral, popliteal and calf veins including the posterior tibial, peroneal and gastrocnemius veins when visible. The superficial great saphenous vein was also interrogated. Spectral Doppler was utilized to evaluate flow at rest and with distal  augmentation maneuvers in the common femoral, femoral and popliteal veins. COMPARISON:  Ultrasound dated 08/13/2015 FINDINGS: Contralateral Common Femoral Vein: Respiratory phasicity is normal and symmetric with the symptomatic side. No evidence of thrombus. Normal compressibility. Common Femoral Vein: There is partially occlusive thrombus within the left common femoral vein. There is noncompressibility of the vessel. Although this may be residual clot from prior study acute thrombus is not excluded as the clot appears to be centrally within the lumen. There is also apparent wall thickening and luminal narrowing of the common femoral vein most likely representing chronic thrombus/scarring. Saphenofemoral Junction: Nonocclusive thrombus with noncompressibility. There is luminal narrowing with thickened appearance of the wall suggestive of chronic thrombus/ scarring. Profunda Femoral Vein: Nonocclusive thrombus. Apparent wall thickening and luminal narrowing suggests chronic thrombus/ scarring. Femoral Vein: Nonocclusive thrombus and noncompressible vessel. The peripheral orientation of the clot with luminal narrowing suggestive of chronic clot/scarring. The distal aspect of the femoral vein appears patent. Popliteal Vein: No evidence of thrombus. Normal compressibility, respiratory phasicity and response to augmentation. Calf Veins: No evidence of thrombus in the visualized veins. Normal compressibility and flow on color Doppler imaging. The peroneal vein is not visualized. Superficial Great Saphenous Vein: None visualized Venous Reflux:  None. Other Findings:  None. IMPRESSION: Extensive nonocclusive thrombus involving the common femoral, femoral, and deep femoral venous system of the  left lower extremity as described. The pattern of luminal narrowing and wall thickening with peripheral orientation of the clot suggests chronic thrombus/scarring. There is a focal area of nonocclusive clot in the left common femoral  vein with a more central orientation of the clot within the lumen. This may be residual thrombus or acute/recurrent clot. Clinical correlation is recommended. These results were called by telephone at the time of interpretation on 12/28/2015 at 8:22 pm to Dr. Vonda Antigua, who verbally acknowledged these results. Electronically Signed   By: Anner Crete M.D.   On: 12/28/2015 20:24    ____________________________________________   PROCEDURES  Procedure(s) performed: None  Critical Care performed: No ____________________________________________   INITIAL IMPRESSION / ASSESSMENT AND PLAN / ED COURSE  Pertinent labs & imaging results that were available during my care of the patient were reviewed by me and considered in my medical decision making (see chart for details).  The ultrasound suggests mostly chronic findings.  I did speak with the radiologist about the one area that is difficult to assess the chronicity.  I then called and spoke by phone with Dr. Delana Meyer who is on-call for vascular surgery.  Based on the assessment that I explained over the phone, he agrees with the plan for close outpatient follow-up.  I discussed this with the patient who agrees with the plan.   ____________________________________________  FINAL CLINICAL IMPRESSION(S) / ED DIAGNOSES  Final diagnoses:  Leg DVT (deep venous thromboembolism), chronic, left (HCC)     MEDICATIONS GIVEN DURING THIS VISIT:  Medications - No data to display   NEW OUTPATIENT MEDICATIONS STARTED DURING THIS VISIT:  New Prescriptions   No medications on file      Note:  This document was prepared using Dragon voice recognition software and may include unintentional dictation errors.   Hinda Kehr, MD 12/28/15 2104

## 2015-12-28 NOTE — ED Notes (Signed)
Pain L upper leg began today, history of DVT same thigh this past December. Denies injury.

## 2016-01-22 DIAGNOSIS — Z86718 Personal history of other venous thrombosis and embolism: Secondary | ICD-10-CM | POA: Insufficient documentation

## 2016-02-08 ENCOUNTER — Encounter: Payer: Self-pay | Admitting: Radiation Oncology

## 2016-02-08 ENCOUNTER — Ambulatory Visit
Admission: RE | Admit: 2016-02-08 | Discharge: 2016-02-08 | Disposition: A | Discharge status: Home / Self Care | Payer: Medicare Other | Source: Ambulatory Visit | Attending: Radiation Oncology | Admitting: Radiation Oncology

## 2016-02-08 VITALS — BP 104/62 | HR 75 | Temp 97.8°F | Resp 18 | Wt 170.4 lb

## 2016-02-08 DIAGNOSIS — Z923 Personal history of irradiation: Secondary | ICD-10-CM | POA: Insufficient documentation

## 2016-02-08 DIAGNOSIS — Z86718 Personal history of other venous thrombosis and embolism: Secondary | ICD-10-CM | POA: Insufficient documentation

## 2016-02-08 DIAGNOSIS — C320 Malignant neoplasm of glottis: Secondary | ICD-10-CM

## 2016-02-08 DIAGNOSIS — Z8521 Personal history of malignant neoplasm of larynx: Secondary | ICD-10-CM | POA: Diagnosis not present

## 2016-02-08 DIAGNOSIS — I890 Lymphedema, not elsewhere classified: Secondary | ICD-10-CM | POA: Insufficient documentation

## 2016-02-08 DIAGNOSIS — Z7901 Long term (current) use of anticoagulants: Secondary | ICD-10-CM | POA: Insufficient documentation

## 2016-02-08 NOTE — Progress Notes (Signed)
Radiation Oncology Follow up Note  Name: Matthew David   Date:   02/08/2016 MRN:  [id number] DOB: [birth year]    This 82 y.o. 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  REFERRING PROVIDER: Adin Hector, MD  HPI: Patient is a 82 year old male now 7 months out having completed radiation therapy to his larynx for squamous cell carcinoma. He's had some problems with dysphasia although is taking no medication at this time no steroids and he has stopped using Carafate. Recently had a problem with. Left leg DVT is currently on blood thinners for that. He also has developed some lymphedema in the anterior chin not unusual for this type of radiation.  COMPLICATIONS OF TREATMENT: none  FOLLOW UP COMPLIANCE: keeps appointments   PHYSICAL EXAM:  BP 104/62 mmHg  Pulse 75  Temp(Src) 97.8 F (36.6 C)  Resp 18  Wt 170 lb 6.7 oz (77.3 kg) Oral cavity is clear no oral mucosal lesions are identified. Indirect mirror examination shows upper airway clear cords appeared approximate well although there still some edema noted vallecula and base of tongue within normal limits no evidence of subject gastric cervical or supraclavicular adenopathy is appreciated. Well-developed well-nourished patient in NAD. HEENT reveals PERLA, EOMI, discs not visualized.  Oral cavity is clear. No oral mucosal lesions are identified. Neck is clear without evidence of cervical or supraclavicular adenopathy. Lungs are clear to A&P. Cardiac examination is essentially unremarkable with regular rate and rhythm without murmur rub or thrill. Abdomen is benign with no organomegaly or masses noted. Motor sensory and DTR levels are equal and symmetric in the upper and lower extremities. Cranial nerves II through XII are grossly intact. Proprioception is intact. No peripheral adenopathy or edema is identified. No motor or sensory levels are  noted. Crude visual fields are within normal range.  RADIOLOGY RESULTS: Prior CT scan is reviewed and compatible with the above-stated findings  PLAN: Patient continues to do well with no evidence of disease out 7 months. I suggested he continues using Carafate on a regular basis to help with some of his dysphasia. He continues using Mucinex. I've also explained he may want to use prednisone for short tapered course should his dysphasia worsened intermittently. He continues close follow-up care with ENT. I have asked to see him back in 6 months for follow-up. Patient knows to call sooner with any concerns.  I would like to take this opportunity to thank you for allowing me to participate in the care of your patient.[name]., MD

## 2016-03-10 ENCOUNTER — Other Ambulatory Visit: Payer: Self-pay | Admitting: Internal Medicine

## 2016-03-10 DIAGNOSIS — R1310 Dysphagia, unspecified: Secondary | ICD-10-CM

## 2016-03-30 ENCOUNTER — Ambulatory Visit
Admission: RE | Admit: 2016-03-30 | Discharge: 2016-03-30 | Disposition: A | Discharge status: Home / Self Care | Payer: Medicare Other | Source: Ambulatory Visit | Attending: Internal Medicine | Admitting: Internal Medicine

## 2016-03-30 DIAGNOSIS — R1310 Dysphagia, unspecified: Secondary | ICD-10-CM | POA: Diagnosis present

## 2016-03-30 DIAGNOSIS — R1312 Dysphagia, oropharyngeal phase: Secondary | ICD-10-CM

## 2016-03-30 NOTE — Therapy (Signed)
[place], [place], [address] Phone: [phone]   Fax:     Modified Barium Swallow  Patient Details  Name: Matthew David MRN: [id number] Date of Birth: [birth year] Referring MD: Dr. Caryl Comes  Encounter Date: 03/30/2016      End of Session - 03/30/16 1535    Visit Number 1   Number of Visits 1   Date for SLP Re-Evaluation 03/30/16   SLP Start Time 86   SLP Stop Time  1400   SLP Time Calculation (min) 60 min   Activity Tolerance Patient tolerated treatment well      Past Medical History:  Diagnosis Date  . Cancer [facility])    Larynx Cancer 03/2015, Rad tx's.  [name] DVT (deep venous thrombosis) ([place])   . Hx of appendectomy   . Hypertension   . Status post right knee replacement   . Stented coronary artery     Past Surgical History:  Procedure Laterality Date  . APPENDECTOMY  1944  . CATARACT EXTRACTION W/ INTRAOCULAR LENS  IMPLANT, BILATERAL Bilateral 2011   both eye done in the same year  . CORONARY ANGIOPLASTY WITH STENT PLACEMENT  2012  . DIRECT LARYNGOSCOPY N/A 04/08/2015   Procedure: DIRECT LARYNGOSCOPY;  Surgeon: Clyde Canterbury, MD;  Location: [facility] ORS;  Service: ENT;  Laterality: N/A;  . JOINT REPLACEMENT Right 2008   knee  . KNEE ARTHROSCOPY Right 2006  . SHOULDER SURGERY Left 2006  . TONSILLECTOMY  1952    There were no vitals filed for this visit.    Subjective: Patient behavior: (alertness, ability to follow instructions, etc.):Patient is alert, able to follow directions and report his swallowing history  Chief complaint: patient reports discomfort swallowing solids, less so with liquids; does not report coughing, choking, or strangling.   Objective:  Radiological Procedure: A videoflouroscopic evaluation of oral-preparatory, reflex initiation, and pharyngeal phases of the swallow was performed; as well as a screening of the upper esophageal phase.  I. POSTURE: Upright in MBS  chair  II. VIEW: Lateral  III. COMPENSATORY STRATEGIES: Voluntary swallow- aids pharyngeal clearing of solids.  Liquid chaser- aids clearing of solids.  IV. BOLUSES ADMINISTERED:   Thin Liquid: 3 cup rim sips   Nectar-thick Liquid: 1 cup rim sip    Puree: 1 teaspoon presentation   Mechanical Soft: 1/4 graham cracker in applesauce  V. RESULTS OF EVALUATION: A. ORAL PREPARATORY PHASE: (The lips, tongue, and velum are observed for strength and coordination)       **Overall Severity Rating: Within normal limits  B. SWALLOW INITIATION/REFLEX: (The reflex is normal if "triggered" by the time the bolus reached the base of the tongue)  **Overall Severity Rating: Mild; , triggers at the vallecular space for solids and while falling from the valleculae to the pyriform sinuses with liquids  C. PHARYNGEAL PHASE: (Pharyngeal function is normal if the bolus shows rapid, smooth, and continuous transit through the pharynx and there is no pharyngeal residue after the swallow)  **Overall Severity Rating: Mild; With solid consistencies (puree and mechanical soft) there is decreased laryngeal elevation (mild) and absent anterior hyoid movement resulting in moderate vallecular residue.  With the liquid swallows (thin and nectar-thick) there is increased laryngeal elevation with modest improved anterior hyoid movement resulting in trace-to-mild vallecular residue.    D. LARYNGEAL PENETRATION: (Material entering into the laryngeal inlet/vestibule but not aspirated) None  E. ASPIRATION: None  F. ESOPHAGEAL PHASE: (Screening of the upper esophagus): no observed abnormality  within the viewable cervical esophagus  ASSESSMENT: 82 year old man; with history of laryngeal cancer and radiation treatment, anterior neck swelling, and discomfort with swallowing- primarily with solids; is presenting with mild oropharyngeal dysphagia.  Oral control of the bolus including oral hold, rotary mastication, and anterior to  posterior transfer are within normal limits. Timing of the pharyngeal swallow is delayed, triggering at the vallecular space for solids and while falling from the valleculae to the pyriform sinuses with liquids.  With solid consistencies (puree and mechanical soft) there is decreased laryngeal elevation (mild) and absent anterior hyoid movement resulting in moderate vallecular residue.  With the liquid swallows (thin and nectar-thick) there is increased laryngeal elevation with modest improved anterior hyoid movement resulting in trace-to-mild vallecular residue.  There was no observed laryngeal penetration or aspiration.  The patient is maintaining good airway protection and is not at significant risk for prandial aspiration.  In view of the increased hyolaryngeal movement with liquids, I do not think that reduced hyolaryngeal movement is due to radiation effects.  It is possible that the patient is unknowingly inhibiting full range of motion in anticipation of having difficulty with solids, resulting in muscle fatigue and discomfort.  He was assured that his swallowing is safe and that he should continue to eat and drink as usual, adapting food items to optimize his comfort level.  PLAN/RECOMMENDATIONS:   A. Diet: Regular as tolerated; may want to soften and moisten foods   B. Swallowing Precautions: Alternate liquids and solids, modify foods as need to optimize comfort   C. Recommended consultation to: follow up with MD as scheduled   D. Therapy recommendations N/A   E. Results and recommendations were discussed with the patient immediately following the study and the final report routed to the referring MD and to the patient's ENT (per his request).    Dysphagia, oropharyngeal phase  Dysphagia - Plan: DG OP Swallowing Func-Medicare/Speech Path, DG OP Swallowing Func-Medicare/Speech Path      G-Codes - 04/06/2016 1536    Functional Assessment Tool Used MBS, clinical judgment   Functional  Limitations Swallowing   Swallow Current Status [id number]) At least 20 percent but less than 40 percent impaired, limited or restricted   Swallow Goal Status [id number]) At least 20 percent but less than 40 percent impaired, limited or restricted   Swallow Discharge Status [phone]) At least 20 percent but less than 40 percent impaired, limited or restricted          Problem List Patient Active Problem List   Diagnosis Date Noted  . Left leg DVT ([place]) 08/13/2015  . [place] 08/13/2015   Leroy Sea, MS/CCC- SLP  Lou Miner Apr 06, 2016, 3:37 PM  [facility] [place], [place], [address] Phone: [phone]   Fax:     Name: Hamad Vanduyne MRN: [id number] Date of Birth: 1922/07/12

## 2016-04-09 ENCOUNTER — Other Ambulatory Visit: Payer: Self-pay | Admitting: Radiation Oncology

## 2016-05-07 ENCOUNTER — Observation Stay
Admission: EM | Admit: 2016-05-07 | Discharge: 2016-05-08 | Disposition: A | Discharge status: Home / Self Care | Payer: Medicare Other | Attending: Internal Medicine | Admitting: Internal Medicine

## 2016-05-07 ENCOUNTER — Emergency Department: Payer: Medicare Other

## 2016-05-07 ENCOUNTER — Encounter: Payer: Self-pay | Admitting: Emergency Medicine

## 2016-05-07 DIAGNOSIS — Z7901 Long term (current) use of anticoagulants: Secondary | ICD-10-CM | POA: Insufficient documentation

## 2016-05-07 DIAGNOSIS — M5136 Other intervertebral disc degeneration, lumbar region: Secondary | ICD-10-CM | POA: Diagnosis not present

## 2016-05-07 DIAGNOSIS — E785 Hyperlipidemia, unspecified: Secondary | ICD-10-CM | POA: Diagnosis not present

## 2016-05-07 DIAGNOSIS — Z7982 Long term (current) use of aspirin: Secondary | ICD-10-CM | POA: Insufficient documentation

## 2016-05-07 DIAGNOSIS — M25512 Pain in left shoulder: Secondary | ICD-10-CM | POA: Diagnosis present

## 2016-05-07 DIAGNOSIS — Z888 Allergy status to other drugs, medicaments and biological substances status: Secondary | ICD-10-CM | POA: Diagnosis not present

## 2016-05-07 DIAGNOSIS — K5730 Diverticulosis of large intestine without perforation or abscess without bleeding: Secondary | ICD-10-CM | POA: Diagnosis not present

## 2016-05-07 DIAGNOSIS — I252 Old myocardial infarction: Secondary | ICD-10-CM | POA: Diagnosis not present

## 2016-05-07 DIAGNOSIS — I700 Atherosclerosis of aorta: Secondary | ICD-10-CM | POA: Diagnosis not present

## 2016-05-07 DIAGNOSIS — I2510 Atherosclerotic heart disease of native coronary artery without angina pectoris: Secondary | ICD-10-CM | POA: Diagnosis not present

## 2016-05-07 DIAGNOSIS — Z955 Presence of coronary angioplasty implant and graft: Secondary | ICD-10-CM | POA: Diagnosis not present

## 2016-05-07 DIAGNOSIS — N400 Enlarged prostate without lower urinary tract symptoms: Secondary | ICD-10-CM | POA: Insufficient documentation

## 2016-05-07 DIAGNOSIS — Z8521 Personal history of malignant neoplasm of larynx: Secondary | ICD-10-CM | POA: Insufficient documentation

## 2016-05-07 DIAGNOSIS — J8410 Pulmonary fibrosis, unspecified: Secondary | ICD-10-CM | POA: Diagnosis not present

## 2016-05-07 DIAGNOSIS — M4806 Spinal stenosis, lumbar region: Secondary | ICD-10-CM | POA: Diagnosis not present

## 2016-05-07 DIAGNOSIS — Z79899 Other long term (current) drug therapy: Secondary | ICD-10-CM | POA: Insufficient documentation

## 2016-05-07 DIAGNOSIS — I739 Peripheral vascular disease, unspecified: Secondary | ICD-10-CM | POA: Diagnosis not present

## 2016-05-07 DIAGNOSIS — Z87891 Personal history of nicotine dependence: Secondary | ICD-10-CM | POA: Insufficient documentation

## 2016-05-07 DIAGNOSIS — Z9842 Cataract extraction status, left eye: Secondary | ICD-10-CM | POA: Diagnosis not present

## 2016-05-07 DIAGNOSIS — R079 Chest pain, unspecified: Principal | ICD-10-CM | POA: Insufficient documentation

## 2016-05-07 DIAGNOSIS — K219 Gastro-esophageal reflux disease without esophagitis: Secondary | ICD-10-CM | POA: Diagnosis not present

## 2016-05-07 DIAGNOSIS — Z9841 Cataract extraction status, right eye: Secondary | ICD-10-CM | POA: Insufficient documentation

## 2016-05-07 DIAGNOSIS — R10812 Left upper quadrant abdominal tenderness: Secondary | ICD-10-CM | POA: Diagnosis present

## 2016-05-07 DIAGNOSIS — Z9049 Acquired absence of other specified parts of digestive tract: Secondary | ICD-10-CM | POA: Insufficient documentation

## 2016-05-07 DIAGNOSIS — I10 Essential (primary) hypertension: Secondary | ICD-10-CM | POA: Diagnosis not present

## 2016-05-07 DIAGNOSIS — Z96651 Presence of right artificial knee joint: Secondary | ICD-10-CM | POA: Insufficient documentation

## 2016-05-07 DIAGNOSIS — K449 Diaphragmatic hernia without obstruction or gangrene: Secondary | ICD-10-CM | POA: Diagnosis not present

## 2016-05-07 LAB — TROPONIN I
Troponin I: <0.03 ng/mL (ref <0.03)
Troponin I: <0.03 ng/mL (ref <0.03)

## 2016-05-07 LAB — CBC
HEMATOCRIT: 39 % — AB (ref 40.0–52.0)
HEMOGLOBIN: 13.6 g/dL (ref 13.0–18.0)
MCH: 30.3 pg (ref 26.0–34.0)
MCHC: 34.8 g/dL (ref 32.0–36.0)
MCV: 87.1 fL (ref 80.0–100.0)
Platelets: 103 10*3/uL — ABNORMAL LOW (ref 150–440)
RBC: 4.48 MIL/uL (ref 4.40–5.90)
RDW: 13.7 % (ref 11.5–14.5)
WBC: 6.1 10*3/uL (ref 3.8–10.6)

## 2016-05-07 LAB — BASIC METABOLIC PANEL
ANION GAP: 7 (ref 5–15)
BUN: 27 mg/dL — AB (ref 6–20)
CALCIUM: 9.3 mg/dL (ref 8.9–10.3)
CO2: 23 mmol/L (ref 22–32)
Chloride: 109 mmol/L (ref 101–111)
Creatinine, Ser: 1.06 mg/dL (ref 0.61–1.24)
GFR calc Af Amer: >60 mL/min (ref >60)
GFR, EST NON AFRICAN AMERICAN: 59 mL/min — AB (ref >60)
Glucose, Bld: 113 mg/dL — ABNORMAL HIGH (ref 65–99)
POTASSIUM: 3.5 mmol/L (ref 3.5–5.1)
SODIUM: 139 mmol/L (ref 135–145)

## 2016-05-07 LAB — HEPATIC FUNCTION PANEL
ALK PHOS: 43 U/L (ref 38–126)
ALT: 14 U/L — AB (ref 17–63)
AST: 19 U/L (ref 15–41)
Albumin: 4.3 g/dL (ref 3.5–5.0)
BILIRUBIN DIRECT: 0.1 mg/dL (ref 0.1–0.5)
BILIRUBIN INDIRECT: 0.4 mg/dL (ref 0.3–0.9)
BILIRUBIN TOTAL: 0.5 mg/dL (ref 0.3–1.2)
TOTAL PROTEIN: 6.7 g/dL (ref 6.5–8.1)

## 2016-05-07 LAB — PROTIME-INR
INR: 1.19
PROTHROMBIN TIME: 15.2 s (ref 11.4–15.2)

## 2016-05-07 MED ORDER — TAMSULOSIN HCL 0.4 MG PO CAPS
0.4000 mg | ORAL_CAPSULE | Freq: Two times a day (BID) | ORAL | Status: DC
  Administered 2016-05-07 – 2016-05-08 (×2): 0.4 mg via ORAL
  Filled 2016-05-07 (×2): qty 1

## 2016-05-07 MED ORDER — FAMOTIDINE IN NACL 20-0.9 MG/50ML-% IV SOLN
20.0000 mg | Freq: Two times a day (BID) | INTRAVENOUS | Status: DC
Start: 2016-05-07 — End: 2016-05-08
  Administered 2016-05-07: 20 mg via INTRAVENOUS
  Filled 2016-05-07 (×3): qty 50

## 2016-05-07 MED ORDER — HEPARIN SODIUM (PORCINE) 5000 UNIT/ML IJ SOLN: 5000.0000 [IU] | Freq: Three times a day (TID) | INTRAMUSCULAR | Status: DC

## 2016-05-07 MED ORDER — SODIUM CHLORIDE 0.9% FLUSH
3.0000 mL | Freq: Two times a day (BID) | INTRAVENOUS | Status: DC
  Administered 2016-05-08: 3 mL via INTRAVENOUS

## 2016-05-07 MED ORDER — DOCUSATE SODIUM 100 MG PO CAPS
100.0000 mg | ORAL_CAPSULE | Freq: Two times a day (BID) | ORAL | Status: DC
  Administered 2016-05-08: 100 mg via ORAL
  Filled 2016-05-07 (×2): qty 1

## 2016-05-07 MED ORDER — ASPIRIN 81 MG PO CHEW
324.0000 mg | CHEWABLE_TABLET | Freq: Once | ORAL | Status: AC
  Administered 2016-05-07: 324 mg via ORAL
  Filled 2016-05-07: qty 4

## 2016-05-07 MED ORDER — HYDROCHLOROTHIAZIDE 12.5 MG PO CAPS
12.5000 mg | ORAL_CAPSULE | Freq: Every day | ORAL | Status: DC
  Administered 2016-05-08: 12.5 mg via ORAL
  Filled 2016-05-07: qty 1

## 2016-05-07 MED ORDER — ASPIRIN EC 81 MG PO TBEC
81.0000 mg | DELAYED_RELEASE_TABLET | Freq: Every day | ORAL | Status: DC
Start: 2016-05-08 — End: 2016-05-08
  Administered 2016-05-08: 81 mg via ORAL
  Filled 2016-05-07: qty 1

## 2016-05-07 MED ORDER — DEXAMETHASONE 4 MG PO TABS: 4.0000 mg | ORAL_TABLET | Freq: Every day | ORAL | Status: DC

## 2016-05-07 MED ORDER — ONDANSETRON HCL 4 MG PO TABS: 4.0000 mg | ORAL_TABLET | Freq: Four times a day (QID) | ORAL | Status: DC | PRN

## 2016-05-07 MED ORDER — NITROGLYCERIN 0.4 MG SL SUBL
0.4000 mg | SUBLINGUAL_TABLET | SUBLINGUAL | Status: DC | PRN
Start: 2016-05-07 — End: 2016-05-08

## 2016-05-07 MED ORDER — IOPAMIDOL (ISOVUE-300) INJECTION 61%
100.0000 mL | Freq: Once | INTRAVENOUS | Status: AC | PRN
  Administered 2016-05-07: 100 mL via INTRAVENOUS

## 2016-05-07 MED ORDER — MORPHINE SULFATE (PF) 2 MG/ML IV SOLN: 2.0000 mg | INTRAVENOUS | Status: DC | PRN

## 2016-05-07 MED ORDER — FLUTICASONE PROPIONATE 50 MCG/ACT NA SUSP
2.0000 | Freq: Every day | NASAL | Status: DC
  Administered 2016-05-08: 2 via NASAL
  Filled 2016-05-07: qty 16

## 2016-05-07 MED ORDER — VALSARTAN-HYDROCHLOROTHIAZIDE 80-12.5 MG PO TABS: 1.0000 | ORAL_TABLET | Freq: Every day | ORAL | Status: DC

## 2016-05-07 MED ORDER — METOPROLOL TARTRATE 25 MG PO TABS
12.5000 mg | ORAL_TABLET | Freq: Two times a day (BID) | ORAL | Status: DC
  Administered 2016-05-07 – 2016-05-08 (×2): 12.5 mg via ORAL
  Filled 2016-05-07 (×2): qty 1

## 2016-05-07 MED ORDER — HYDROCODONE-ACETAMINOPHEN 5-325 MG PO TABS: 1.0000 | ORAL_TABLET | Freq: Four times a day (QID) | ORAL | Status: DC | PRN

## 2016-05-07 MED ORDER — PANTOPRAZOLE SODIUM 40 MG PO TBEC
40.0000 mg | DELAYED_RELEASE_TABLET | Freq: Two times a day (BID) | ORAL | Status: DC
  Administered 2016-05-08: 40 mg via ORAL
  Filled 2016-05-07: qty 1

## 2016-05-07 MED ORDER — BISACODYL 10 MG RE SUPP: 10.0000 mg | Freq: Every day | RECTAL | Status: DC | PRN

## 2016-05-07 MED ORDER — POTASSIUM CHLORIDE IN NACL 20-0.9 MEQ/L-% IV SOLN
INTRAVENOUS | Status: DC
  Administered 2016-05-07: via INTRAVENOUS
  Filled 2016-05-07 (×3): qty 1000

## 2016-05-07 MED ORDER — PRAVASTATIN SODIUM 20 MG PO TABS: 20.0000 mg | ORAL_TABLET | Freq: Every day | ORAL | Status: DC

## 2016-05-07 MED ORDER — IRBESARTAN 75 MG PO TABS
75.0000 mg | ORAL_TABLET | Freq: Every day | ORAL | Status: DC
  Administered 2016-05-08: 75 mg via ORAL
  Filled 2016-05-07: qty 1

## 2016-05-07 MED ORDER — MIRTAZAPINE 15 MG PO TABS: 7.5000 mg | ORAL_TABLET | Freq: Every evening | ORAL | Status: DC | PRN

## 2016-05-07 MED ORDER — ACETAMINOPHEN 650 MG RE SUPP: 650.0000 mg | Freq: Four times a day (QID) | RECTAL | Status: DC | PRN

## 2016-05-07 MED ORDER — LORATADINE 10 MG PO TABS
10.0000 mg | ORAL_TABLET | Freq: Every day | ORAL | Status: DC
  Administered 2016-05-08: 10 mg via ORAL
  Filled 2016-05-07: qty 1

## 2016-05-07 MED ORDER — AZELASTINE HCL 0.1 % NA SOLN
1.0000 | Freq: Two times a day (BID) | NASAL | Status: DC
  Administered 2016-05-07 – 2016-05-08 (×2): 1 via NASAL
  Filled 2016-05-07: qty 30

## 2016-05-07 MED ORDER — ONDANSETRON HCL 4 MG/2ML IJ SOLN: 4.0000 mg | Freq: Four times a day (QID) | INTRAMUSCULAR | Status: DC | PRN

## 2016-05-07 MED ORDER — ACETAMINOPHEN 325 MG PO TABS: 650.0000 mg | ORAL_TABLET | Freq: Four times a day (QID) | ORAL | Status: DC | PRN

## 2016-05-07 MED ORDER — APIXABAN 5 MG PO TABS
5.0000 mg | ORAL_TABLET | Freq: Two times a day (BID) | ORAL | Status: DC
  Administered 2016-05-07 – 2016-05-08 (×2): 5 mg via ORAL
  Filled 2016-05-07 (×2): qty 1

## 2016-05-07 MED ORDER — LORAZEPAM 1 MG PO TABS
1.0000 mg | ORAL_TABLET | Freq: Every day | ORAL | Status: DC
  Administered 2016-05-07: 1 mg via ORAL
  Filled 2016-05-07: qty 1

## 2016-05-07 MED ORDER — CELECOXIB 200 MG PO CAPS
200.0000 mg | ORAL_CAPSULE | Freq: Two times a day (BID) | ORAL | Status: DC
  Filled 2016-05-07: qty 1

## 2016-05-07 MED ORDER — SUCRALFATE 1 G PO TABS
1.0000 g | ORAL_TABLET | Freq: Three times a day (TID) | ORAL | Status: DC
  Administered 2016-05-08: 1 g via ORAL
  Filled 2016-05-07: qty 1

## 2016-05-07 NOTE — ED Notes (Signed)
Report called to Michelle, RN 

## 2016-05-07 NOTE — ED Notes (Signed)
Pt taken to CT at this time.

## 2016-05-07 NOTE — ED Notes (Signed)
Pt transported to room 259A

## 2016-05-07 NOTE — ED Notes (Signed)
MD at bedside at this time.

## 2016-05-07 NOTE — H&P (Signed)
History and Physical    Matthew David DOB: 09-20-1921 DOA: 05/07/2016  Referring physician: Dr. Quentin Cornwall PCP: Adin Hector, MD  Specialists: Dr. Saralyn Pilar  Chief Complaint: chest pain  HPI: Matthew David is a 82 y.o. male has a past medical history significant for ASCVD and HTN now with left chest, shoulder, and upper abdominal pain. CT of abdomen and CXR in ER. OK. EKG and 1st troponin normal. Currently pain-free. He is now admitted.  Review of Systems: The patient denies anorexia, fever, weight loss,, vision loss, decreased hearing, hoarseness, syncope, dyspnea on exertion, peripheral edema, balance deficits, hemoptysis, abdominal pain, melena, hematochezia, severe indigestion/heartburn, hematuria, incontinence, genital sores, muscle weakness, suspicious skin lesions, transient blindness, difficulty walking, depression, unusual weight change, abnormal bleeding, enlarged lymph nodes, angioedema, and breast masses.   Past Medical History:  Diagnosis Date  . Cancer [facility])    Larynx Cancer 03/2015, Rad tx's.  [name] DVT (deep venous thrombosis) ([place])   . Hx of appendectomy   . Hypertension   . Status post right knee replacement   . Stented coronary artery    Past Surgical History:  Procedure Laterality Date  . APPENDECTOMY  1944  . CATARACT EXTRACTION W/ INTRAOCULAR LENS  IMPLANT, BILATERAL Bilateral 2011   both eye done in the same year  . CORONARY ANGIOPLASTY WITH STENT PLACEMENT  2012  . DIRECT LARYNGOSCOPY N/A 04/08/2015   Procedure: DIRECT LARYNGOSCOPY;  Surgeon: Clyde Canterbury, MD;  Location: [facility] ORS;  Service: ENT;  Laterality: N/A;  . JOINT REPLACEMENT Right 2008   knee  . KNEE ARTHROSCOPY Right 2006  . SHOULDER SURGERY Left 2006  . TONSILLECTOMY  1952   Social History:  reports that he quit smoking about 32 years ago. His smoking use included Cigars and Pipe. He has never used smokeless tobacco. He reports that he does not drink alcohol or use  drugs.  Allergies  Allergen Reactions  . Gabapentin Nausea Only    History reviewed. No pertinent family history.  Prior to Admission medications   Medication Sig Start Date End Date Taking? Authorizing Provider  Alum & Mag Hydroxide-Simeth (MAGIC MOUTHWASH) SOLN Take 10 mLs by mouth 4 (four) times daily as needed for mouth pain.    Historical Provider, MD  apixaban (ELIQUIS) 5 MG TABS tablet Take 1 tablet (5 mg total) by mouth 2 (two) times daily. 08/22/15   Demetrios Loll, MD  aspirin 81 MG tablet Take 81 mg by mouth daily after breakfast.    Historical Provider, MD  azelastine (ASTELIN) 0.1 % nasal spray Place 1 spray into both nostrils 2 (two) times daily. Use in each nostril as directed    Historical Provider, MD  celecoxib (CELEBREX) 200 MG capsule Take 200 mg by mouth 2 (two) times daily.    Historical Provider, MD  cetirizine (ZYRTEC) 10 MG tablet Take 10 mg by mouth daily.    Historical Provider, MD  CRANBERRY CONCENTRATE PO Take 1 tablet by mouth 2 (two) times daily.    Historical Provider, MD  dexamethasone (DECADRON) 4 MG tablet Take 1 tablet (4 mg total) by mouth daily. 08/18/15   Noreene Filbert, MD  doxycycline (VIBRAMYCIN) 100 MG capsule  01/22/16   Historical Provider, MD  fluticasone (FLONASE) 50 MCG/ACT nasal spray Place 2 sprays into both nostrils daily.    Historical Provider, MD  HYDROcodone-acetaminophen (NORCO/VICODIN) 5-325 MG tablet Take 1 tablet by mouth every 6 (six) hours as needed for moderate pain.    Historical Provider, MD  lidocaine (XYLOCAINE) 2 % solution USE AS DIRECTED 20ML BY MOUTH OR THROAT 3 TIMES DAILY BEFORE MEALS 10/29/15   Noreene Filbert, MD  LORazepam (ATIVAN) 1 MG tablet Take 1 mg by mouth at bedtime.    Historical Provider, MD  lovastatin (MEVACOR) 20 MG tablet Take 20 mg by mouth at bedtime.    Historical Provider, MD  Lysine 1000 MG TABS Take 1 tablet by mouth every morning.    Historical Provider, MD  metoprolol tartrate (LOPRESSOR) 25 MG tablet Take  12.5 mg by mouth 2 (two) times daily.    Historical Provider, MD  mirtazapine (REMERON) 15 MG tablet Take 7.5 mg by mouth at bedtime as needed (0.5 tablet at bedtime for sleep).     Historical Provider, MD  multivitamin-iron-minerals-folic acid (CENTRUM) chewable tablet Chew 1 tablet by mouth every morning.    Historical Provider, MD  oxyCODONE-acetaminophen (PERCOCET) 7.5-325 MG tablet  01/29/16   Historical Provider, MD  pantoprazole (PROTONIX) 40 MG tablet Take 40 mg by mouth 2 (two) times daily.    Historical Provider, MD  Probiotic Product (ALIGN PO) Take 1 capsule by mouth every morning.    Historical Provider, MD  Saw Palmetto-Phytosterols (PROSTATE SR PO) Take 1 tablet by mouth 2 (two) times daily.    Historical Provider, MD  sucralfate (CARAFATE) 1 g tablet TAKE ONE TABLET 3 TIMES DAILY WITH MEALS 04/11/16   Noreene Filbert, MD  tamsulosin (FLOMAX) 0.4 MG CAPS capsule Take 0.4 mg by mouth 2 (two) times daily.    Historical Provider, MD  valsartan-hydrochlorothiazide (DIOVAN-HCT) 80-12.5 MG tablet  01/11/16   Historical Provider, MD   Physical Exam: Vitals:   05/07/16 1730 05/07/16 1909 05/07/16 1910 05/07/16 2000  BP: 123/87 (!) 134/54  131/60  Pulse:   (!) 56 73  Resp:   11 13  Temp:      TempSrc:      SpO2:   100% 99%  Weight:      Height:         General:  No apparent distress, WDWN, [place]/AT  Eyes: PERRL, EOMI, no scleral icterus, conjunctiva clear  ENT: moist oropharynx without exudate, TM's benign, dentition fair  Neck: supple, no lymphadenopathy. No brutis or thyromegaly  Cardiovascular: regular rate without MRG; 2+ peripheral pulses, no JVD, no peripheral edema  Respiratory: CTA biL, good air movement without wheezing, rhonchi or crackled. Respiratory effort normal  Abdomen: soft, non tender to palpation, positive bowel sounds, no guarding, no rebound  Skin: no rashes or lesions  Musculoskeletal: normal bulk and tone, no joint swelling  Psychiatric: normal mood and  affect, A&OX3  Neurologic: CN 2-12 grossly intact, Motor strength 5/5 in all 4 groups with symmetric DTR's and non-focal sensory exam  Labs on Admission:  Basic Metabolic Panel:  Recent Labs Lab 05/07/16 1740  NA 139  K 3.5  CL 109  CO2 23  GLUCOSE 113*  BUN 27*  CREATININE 1.06  CALCIUM 9.3   Liver Function Tests:  Recent Labs Lab 05/07/16 1740  AST 19  ALT 14*  ALKPHOS 43  BILITOT 0.5  PROT 6.7  ALBUMIN 4.3   No results for input(s): LIPASE, AMYLASE in the last 168 hours. No results for input(s): AMMONIA in the last 168 hours. CBC:  Recent Labs Lab 05/07/16 1740  WBC 6.1  HGB 13.6  HCT 39.0*  MCV 87.1  PLT 103*   Cardiac Enzymes:  Recent Labs Lab 05/07/16 1740  TROPONINI <0.03    BNP (last 3 results)  No results for input(s): BNP in the last 8760 hours.  ProBNP (last 3 results) No results for input(s): PROBNP in the last 8760 hours.  CBG: No results for input(s): GLUCAP in the last 168 hours.  Radiological Exams on Admission: Dg Chest 2 View  Result Date: 05/07/2016 CLINICAL DATA:  Intermittent left-sided chest pain 4 days worsening today. EXAM: CHEST  2 VIEW COMPARISON:  02/27/2014 FINDINGS: Lungs are adequately inflated without consolidation or effusion. Cardiomediastinal silhouette is within normal. There are mild degenerate changes of the spine. Old left humeral head/neck fracture. IMPRESSION: No active cardiopulmonary disease. Electronically Signed   By: Marin Olp M.D.   On: 05/07/2016 18:11   Ct Abdomen Pelvis W Contrast  Result Date: 05/07/2016 CLINICAL DATA:  LEFT upper quadrant LEFT shoulder pain for 1 week no known injury, on Eliquis for blood clots, laryngeal cancer post radiation therapy in 2016, prior appendectomy, coronary artery disease post stenting, hypertension EXAM: CT ABDOMEN AND PELVIS WITH CONTRAST TECHNIQUE: Multidetector CT imaging of the abdomen and pelvis was performed using the standard protocol following bolus  administration of intravenous contrast. Sagittal and coronal MPR images reconstructed from axial data set. CONTRAST:  167mL ISOVUE-300 IOPAMIDOL (ISOVUE-300) INJECTION 61% IV. No oral contrast administered. COMPARISON:  01/30/2015 FINDINGS: Lower chest: Calcified granuloma LEFT lower lobe image 5 stable. Lung bases otherwise clear. Hepatobiliary: Contracted gallbladder.  Liver normal appearance Pancreas: Normal appearance Spleen: Normal appearance Adrenals/Urinary Tract: Adrenal glands normal appearance. Kidneys, ureters, and bladder normal appearance. Minimal prostatic enlargement. Stomach/Bowel: Post appendectomy. Sigmoid diverticulosis without evidence of diverticulitis. Proximal stomach incompletely distended, unable to exclude gastric wall thickening in this setting. Mildly prominent stool in rectum. Bowel loops otherwise normal appearance. Vascular/Lymphatic: Atherosclerotic calcifications aorta, iliac and femoral arteries. Aortic tortuosity without discrete aneurysm. No adenopathy. Reproductive: N/A Other: No free air or free fluid. No hernia or acute inflammatory process. Musculoskeletal: Osseous demineralization. Degenerative disc and facet disease changes lumbar spine. Multilevel vacuum phenomenon lumbar spine. 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 Grade 1 anterolisthesis L5-S1. Central acquired spinal stenosis and scattered lateral recess stenosis at multiple levels lumbar spine notably L3-L4 and L4-L5. No destructive bone lesion. IMPRESSION: Distal colonic diverticulosis without evidence of diverticulitis. No acute intra-abdominal or intrapelvic abnormalities. Aortic atherosclerosis. Extensive degenerative changes lumbar spine with spinal stenosis at L3-L4 and L4-L5 as well as grade 1 anterolisthesis at L5-S1 secondary to BILATERAL spondylolysis of L5. Electronically Signed   By: Lavonia Dana M.D.   On: 05/07/2016 19:54    EKG: Independently reviewed.  Assessment/Plan Principal Problem:   Chest  pain Active Problems:   ASCVD (arteriosclerotic cardiovascular disease)   HLD (hyperlipidemia)   HTN (hypertension)   Will observe on telemetry with serial enzymes. Echo ordered. Consult Cardiology. Repeat labs in AM  Diet: heart healthy Fluids: NS@75  DVT Prophylaxis: SQ Heparin  Code Status: FULL  Family Communication: yes  Disposition Plan: home  Time spent: 50 min

## 2016-05-07 NOTE — ED Triage Notes (Signed)
Pt c/o intermittent left sided chest pain for last 4 days. Today pain got worse and was in left shoulder. Denies SHOB. Relieved with rest.

## 2016-05-07 NOTE — ED Provider Notes (Signed)
[facility] Emergency Department Provider Note    First MD Initiated Contact with Patient 05/07/16 1830     (approximate)  I have reviewed the triage vital signs and the nursing notes.   HISTORY  Chief Complaint Chest Pain    HPI Matthew David is a 82 y.o. male with history of laryngeal cancer, DVT on Eliquis as well as known CAD presents with several episodes of left shoulder pain and left upper quadrant pain over the past 2-3 days. Denies any shortness of breath or chest pressure. States that his previous MI was "silent". States that he had a cardiac catheter this year that showed he did have some residual stenosis.  Reports that he went to a wedding anniversary party today started feeling fatigued and having worsening left shoulder pain. When I ambulated down and had some improvement in the symptoms but as his symptoms did not resolve he was brought to the ER for further evaluation.   Past Medical History:  Diagnosis Date  . Cancer [facility])    Larynx Cancer 03/2015, Rad tx's.  [name] DVT (deep venous thrombosis) ([place])   . Hx of appendectomy   . Hypertension   . Status post right knee replacement   . Stented coronary artery     Patient Active Problem List   Diagnosis Date Noted  . Left leg DVT ([place]) 08/13/2015  . [place] 08/13/2015    Past Surgical History:  Procedure Laterality Date  . APPENDECTOMY  1944  . CATARACT EXTRACTION W/ INTRAOCULAR LENS  IMPLANT, BILATERAL Bilateral 2011   both eye done in the same year  . CORONARY ANGIOPLASTY WITH STENT PLACEMENT  2012  . DIRECT LARYNGOSCOPY N/A 04/08/2015   Procedure: DIRECT LARYNGOSCOPY;  Surgeon: Clyde Canterbury, MD;  Location: [facility] ORS;  Service: ENT;  Laterality: N/A;  . JOINT REPLACEMENT Right 2008   knee  . KNEE ARTHROSCOPY Right 2006  . SHOULDER SURGERY Left 2006  . TONSILLECTOMY  1952    Prior to Admission medications   Medication Sig Start Date End Date Taking? Authorizing Provider  Alum & Mag  Hydroxide-Simeth (MAGIC MOUTHWASH) SOLN Take 10 mLs by mouth 4 (four) times daily as needed for mouth pain.    Historical Provider, MD  apixaban (ELIQUIS) 5 MG TABS tablet Take 1 tablet (5 mg total) by mouth 2 (two) times daily. 08/22/15   Demetrios Loll, MD  aspirin 81 MG tablet Take 81 mg by mouth daily after breakfast.    Historical Provider, MD  azelastine (ASTELIN) 0.1 % nasal spray Place 1 spray into both nostrils 2 (two) times daily. Use in each nostril as directed    Historical Provider, MD  celecoxib (CELEBREX) 200 MG capsule Take 200 mg by mouth 2 (two) times daily.    Historical Provider, MD  cetirizine (ZYRTEC) 10 MG tablet Take 10 mg by mouth daily.    Historical Provider, MD  CRANBERRY CONCENTRATE PO Take 1 tablet by mouth 2 (two) times daily.    Historical Provider, MD  dexamethasone (DECADRON) 4 MG tablet Take 1 tablet (4 mg total) by mouth daily. 08/18/15   Noreene Filbert, MD  doxycycline (VIBRAMYCIN) 100 MG capsule  01/22/16   Historical Provider, MD  fluticasone (FLONASE) 50 MCG/ACT nasal spray Place 2 sprays into both nostrils daily.    Historical Provider, MD  HYDROcodone-acetaminophen (NORCO/VICODIN) 5-325 MG tablet Take 1 tablet by mouth every 6 (six) hours as needed for moderate pain.    Historical Provider, MD  lidocaine (XYLOCAINE) 2 %  solution USE AS DIRECTED 20ML BY MOUTH OR THROAT 3 TIMES DAILY BEFORE MEALS 10/29/15   Noreene Filbert, MD  LORazepam (ATIVAN) 1 MG tablet Take 1 mg by mouth at bedtime.    Historical Provider, MD  lovastatin (MEVACOR) 20 MG tablet Take 20 mg by mouth at bedtime.    Historical Provider, MD  Lysine 1000 MG TABS Take 1 tablet by mouth every morning.    Historical Provider, MD  metoprolol tartrate (LOPRESSOR) 25 MG tablet Take 12.5 mg by mouth 2 (two) times daily.    Historical Provider, MD  mirtazapine (REMERON) 15 MG tablet Take 7.5 mg by mouth at bedtime as needed (0.5 tablet at bedtime for sleep).     Historical Provider, MD    multivitamin-iron-minerals-folic acid (CENTRUM) chewable tablet Chew 1 tablet by mouth every morning.    Historical Provider, MD  oxyCODONE-acetaminophen (PERCOCET) 7.5-325 MG tablet  01/29/16   Historical Provider, MD  pantoprazole (PROTONIX) 40 MG tablet Take 40 mg by mouth 2 (two) times daily.    Historical Provider, MD  Probiotic Product (ALIGN PO) Take 1 capsule by mouth every morning.    Historical Provider, MD  Saw Palmetto-Phytosterols (PROSTATE SR PO) Take 1 tablet by mouth 2 (two) times daily.    Historical Provider, MD  sucralfate (CARAFATE) 1 g tablet TAKE ONE TABLET 3 TIMES DAILY WITH MEALS 04/11/16   Noreene Filbert, MD  tamsulosin (FLOMAX) 0.4 MG CAPS capsule Take 0.4 mg by mouth 2 (two) times daily.    Historical Provider, MD  valsartan-hydrochlorothiazide (DIOVAN-HCT) 80-12.5 MG tablet  01/11/16   Historical Provider, MD    Allergies Gabapentin  History reviewed. No pertinent family history.  Social History Social History  Substance Use Topics  . Smoking status: Former Smoker    Types: Cigars, Pipe    Quit date: 03/30/1984  . Smokeless tobacco: Not on file  . Alcohol use No    Review of Systems Patient denies headaches, rhinorrhea, blurry vision, numbness, shortness of breath, chest pain, edema, cough, abdominal pain, nausea, vomiting, diarrhea, dysuria, fevers, rashes or hallucinations unless otherwise stated above in HPI. ____________________________________________   PHYSICAL EXAM:  VITAL SIGNS: Vitals:   05/07/16 1728 05/07/16 1730  BP:  123/87  Pulse: 73   Resp: 18   Temp: 98 F (36.7 C)     Constitutional: Alert and oriented. Well appearing and in no acute distress. Eyes: Conjunctivae are normal. PERRL. EOMI. Head: Atraumatic. Nose: No congestion/rhinnorhea. Mouth/Throat: Mucous membranes are moist.  Oropharynx non-erythematous. Neck: No stridor. Painless ROM. No cervical spine tenderness to palpation Hematological/Lymphatic/Immunilogical: No cervical  lymphadenopathy. Cardiovascular: Normal rate, regular rhythm. Grossly normal heart sounds.  Good peripheral circulation. Respiratory: Normal respiratory effort.  No retractions. Lungs CTAB. Gastrointestinal: Soft and Tenderness to palpation in the left upper quadrant.. No distention. No abdominal bruits. No CVA tenderness.  Musculoskeletal: No lower extremity tenderness nor edema.  No joint effusions. Neurologic:  Normal speech and language. No gross focal neurologic deficits are appreciated. No gait instability. Skin:  Skin is warm, dry and intact. No rash noted. Psychiatric: Mood and affect are normal. Speech and behavior are normal.  ____________________________________________   LABS (all labs ordered are listed, but only abnormal results are displayed)  Results for orders placed or performed during the hospital encounter of 05/07/16 (from the past 24 hour(s))  Basic metabolic panel     Status: Abnormal   Collection Time: 05/07/16  5:40 PM  Result Value Ref Range   Sodium 139 135 - 145  mmol/L   Potassium 3.5 3.5 - 5.1 mmol/L   Chloride 109 101 - 111 mmol/L   CO2 23 22 - 32 mmol/L   Glucose, Bld 113 (H) 65 - 99 mg/dL   BUN 27 (H) 6 - 20 mg/dL   Creatinine, Ser 1.06 0.61 - 1.24 mg/dL   Calcium 9.3 8.9 - 10.3 mg/dL   GFR calc non Af Amer 59 (L) >60 mL/min   GFR calc Af Amer >60 >60 mL/min   Anion gap 7 5 - 15  CBC     Status: Abnormal   Collection Time: 05/07/16  5:40 PM  Result Value Ref Range   WBC 6.1 3.8 - 10.6 K/uL   RBC 4.48 4.40 - 5.90 MIL/uL   Hemoglobin 13.6 13.0 - 18.0 g/dL   HCT 39.0 (L) 40.0 - 52.0 %   MCV 87.1 80.0 - 100.0 fL   MCH 30.3 26.0 - 34.0 pg   MCHC 34.8 32.0 - 36.0 g/dL   RDW 13.7 11.5 - 14.5 %   Platelets 103 (L) 150 - 440 K/uL  Troponin I     Status: None   Collection Time: 05/07/16  5:40 PM  Result Value Ref Range   Troponin I <0.03 <0.03 ng/mL   ____________________________________________  EKG My review and personal interpretation at  Time: 17:28   Indication: shoulder pain  Rate: 85  Rhythm: nsr Axis: normal Other: no acute ischemic changes. ____________________________________________  RADIOLOGY  CXR my read shows no evidence of acute cardiopulmonary process.  CT abd IMPRESSION: Distal colonic diverticulosis without evidence of diverticulitis.  No acute intra-abdominal or intrapelvic abnormalities.  Aortic atherosclerosis.  Extensive degenerative changes lumbar spine with spinal stenosis at L3-L4 and L4-L5 as well as grade 1 anterolisthesis at L5-S1 secondary to BILATERAL spondylolysis of L5.  ____________________________________________   PROCEDURES  Procedure(s) performed: none    Critical Care performed: no ____________________________________________   INITIAL IMPRESSION / ASSESSMENT AND PLAN / ED COURSE  Pertinent labs & imaging results that were available during my care of the patient were reviewed by me and considered in my medical decision making (see chart for details).  DDX: acs, angina, pna, splenic injury, hiatal hernia, reflux  Karen Sotto is a 82 y.o. who presents to the ED with chief complaint of left shoulder and left upper quadrant abdominal pain. Patient with a significant history of coronary disease. Does describe some atypical features concerning for ACS. Initial EKG and troponin is negative for acute ischemia. On exam he does have some left upper quadrant tenderness to palpation and as he is on Eliquis I am concerned for spontaneous rupture of the spleen. CT imaging of the abdomen was ordered to evaluate for acute splenic hematoma or hemorrhage. CT imaging shows no evidence of acute injury or process that would explain the patient's discomfort. Based on his age and risk factors and as he has not had a cardiac workup 3 years I recommended admission to the hospital for further chest pain evaluation.  Have discussed with the patient and available family all diagnostics and  treatments performed thus far and all questions were answered to the best of my ability. The patient demonstrates understanding and agreement with plan.   Clinical Course     ____________________________________________   FINAL CLINICAL IMPRESSION(S) / ED DIAGNOSES  Final diagnoses:  Chest pain, unspecified chest pain type  Acute pain of left shoulder  Left upper quadrant abdominal tenderness      NEW MEDICATIONS STARTED DURING THIS VISIT:  New Prescriptions  No medications on file     Note:  This document was prepared using Dragon voice recognition software and may include unintentional dictation errors.    Merlyn Lot, MD 05/07/16 2142

## 2016-05-07 NOTE — ED Notes (Signed)
This RN notified by Carney Harder, EDT that patient was complaining of chest pain. This RN to bedside in regards to patient complaints. Pt states "I felt some pulling in my chest but that's because I was coughing". VSS at this time. Dr. Ara Kussmaul made aware of patient complaint. No new orders received at this time.

## 2016-05-08 DIAGNOSIS — R079 Chest pain, unspecified: Secondary | ICD-10-CM | POA: Diagnosis not present

## 2016-05-08 LAB — COMPREHENSIVE METABOLIC PANEL
ALBUMIN: 3.6 g/dL (ref 3.5–5.0)
ALK PHOS: 36 U/L — AB (ref 38–126)
ALT: 11 U/L — AB (ref 17–63)
AST: 14 U/L — AB (ref 15–41)
Anion gap: 4 — ABNORMAL LOW (ref 5–15)
BILIRUBIN TOTAL: 0.5 mg/dL (ref 0.3–1.2)
BUN: 22 mg/dL — ABNORMAL HIGH (ref 6–20)
CALCIUM: 8.6 mg/dL — AB (ref 8.9–10.3)
CO2: 26 mmol/L (ref 22–32)
CREATININE: 0.83 mg/dL (ref 0.61–1.24)
Chloride: 113 mmol/L — ABNORMAL HIGH (ref 101–111)
GFR calc Af Amer: >60 mL/min (ref >60)
GLUCOSE: 98 mg/dL (ref 65–99)
Potassium: 3.6 mmol/L (ref 3.5–5.1)
Sodium: 143 mmol/L (ref 135–145)
TOTAL PROTEIN: 5.8 g/dL — AB (ref 6.5–8.1)

## 2016-05-08 LAB — CBC
HEMATOCRIT: 37.3 % — AB (ref 40.0–52.0)
HEMOGLOBIN: 12.9 g/dL — AB (ref 13.0–18.0)
MCH: 30.3 pg (ref 26.0–34.0)
MCHC: 34.6 g/dL (ref 32.0–36.0)
MCV: 87.7 fL (ref 80.0–100.0)
Platelets: 91 10*3/uL — ABNORMAL LOW (ref 150–440)
RBC: 4.25 MIL/uL — ABNORMAL LOW (ref 4.40–5.90)
RDW: 13.9 % (ref 11.5–14.5)
WBC: 4.3 10*3/uL (ref 3.8–10.6)

## 2016-05-08 LAB — TROPONIN I

## 2016-05-08 NOTE — Consult Note (Signed)
[facility] Cardiology  CARDIOLOGY CONSULT NOTE  Patient ID: Matthew David MRN: [id number] DOB/AGE: 80/05/1922 80 y.o.  Admit date: 05/07/2016 Referring Physician Leslye Peer Primary Physician [facility] Primary Cardiologist Shallen Luedke Reason for Consultation Chest pain  HPI: 82 year old gentleman referred for evaluation of chest pain. The patient has known coronary artery disease, status post drug-eluting stent 03/02/2011. Cardiac catheterization 02/28/2014 revealed patent stent LAD with 60% stenosis left main with negative FFR. The patient presents to [facility] emergency room with 4-5 day history of intermittent left flank discomfort with radiation to his left neck and shoulder. Patient has received recent radiation treatment for laryngeal CA treatment to neck discomfort. ECG was negative. The patient has ruled out for myocardial infarction with negative troponin 3. The patient currently denies chest pain. He reports that his current symptoms are unlike his prior angina.  Review of systems complete and found to be negative unless listed above     Past Medical History:  Diagnosis Date  . Cancer [facility])    Larynx Cancer 03/2015, Rad tx's.  [name] DVT (deep venous thrombosis) ([place])   . Hx of appendectomy   . Hypertension   . Status post right knee replacement   . Stented coronary artery     Past Surgical History:  Procedure Laterality Date  . APPENDECTOMY  1944  . CATARACT EXTRACTION W/ INTRAOCULAR LENS  IMPLANT, BILATERAL Bilateral 2011   both eye done in the same year  . CORONARY ANGIOPLASTY WITH STENT PLACEMENT  2012  . DIRECT LARYNGOSCOPY N/A 04/08/2015   Procedure: DIRECT LARYNGOSCOPY;  Surgeon: Clyde Canterbury, MD;  Location: [facility] ORS;  Service: ENT;  Laterality: N/A;  . JOINT REPLACEMENT Right 2008   knee  . KNEE ARTHROSCOPY Right 2006  . SHOULDER SURGERY Left 2006  . TONSILLECTOMY  1952    Prescriptions Prior to Admission  Medication Sig Dispense Refill Last Dose  . Alum & Mag Hydroxide-Simeth (MAGIC  MOUTHWASH) SOLN Take 10 mLs by mouth 4 (four) times daily as needed for mouth pain.   Taking  . apixaban (ELIQUIS) 5 MG TABS tablet Take 1 tablet (5 mg total) by mouth 2 (two) times daily. 60 tablet 0 Taking  . aspirin 81 MG tablet Take 81 mg by mouth daily after breakfast.   Taking  . azelastine (ASTELIN) 0.1 % nasal spray Place 1 spray into both nostrils 2 (two) times daily. Use in each nostril as directed   Taking  . celecoxib (CELEBREX) 200 MG capsule Take 200 mg by mouth 2 (two) times daily.   Taking  . cetirizine (ZYRTEC) 10 MG tablet Take 10 mg by mouth daily.   08/13/2015 at am  . CRANBERRY CONCENTRATE PO Take 1 tablet by mouth 2 (two) times daily.   Taking  . dexamethasone (DECADRON) 4 MG tablet Take 1 tablet (4 mg total) by mouth daily. 5 tablet 0 Taking  . doxycycline (VIBRAMYCIN) 100 MG capsule    Taking  . fluticasone (FLONASE) 50 MCG/ACT nasal spray Place 2 sprays into both nostrils daily.   Taking  . HYDROcodone-acetaminophen (NORCO/VICODIN) 5-325 MG tablet Take 1 tablet by mouth every 6 (six) hours as needed for moderate pain.   Taking  . lidocaine (XYLOCAINE) 2 % solution USE AS DIRECTED 20ML BY MOUTH OR THROAT 3 TIMES DAILY BEFORE MEALS 200 mL 6 Taking  . LORazepam (ATIVAN) 1 MG tablet Take 1 mg by mouth at bedtime.   Taking  . lovastatin (MEVACOR) 20 MG tablet Take 20 mg by mouth at bedtime.   Taking  .  Lysine 1000 MG TABS Take 1 tablet by mouth every morning.   Taking  . metoprolol tartrate (LOPRESSOR) 25 MG tablet Take 12.5 mg by mouth 2 (two) times daily.   Taking  . mirtazapine (REMERON) 15 MG tablet Take 7.5 mg by mouth at bedtime as needed (0.5 tablet at bedtime for sleep).    Taking  . multivitamin-iron-minerals-folic acid (CENTRUM) chewable tablet Chew 1 tablet by mouth every morning.   Taking  . oxyCODONE-acetaminophen (PERCOCET) 7.5-325 MG tablet    Taking  . pantoprazole (PROTONIX) 40 MG tablet Take 40 mg by mouth 2 (two) times daily.   Taking  . Probiotic Product  (ALIGN PO) Take 1 capsule by mouth every morning.   Taking  . Saw Palmetto-Phytosterols (PROSTATE SR PO) Take 1 tablet by mouth 2 (two) times daily.   Taking  . sucralfate (CARAFATE) 1 g tablet TAKE ONE TABLET 3 TIMES DAILY WITH MEALS 90 tablet 11   . tamsulosin (FLOMAX) 0.4 MG CAPS capsule Take 0.4 mg by mouth 2 (two) times daily.   Taking  . valsartan-hydrochlorothiazide (DIOVAN-HCT) 80-12.5 MG tablet    Taking   Social History   Social History  . Marital status: Married    Spouse name: N/A  . Number of children: N/A  . Years of education: N/A   Occupational History  . Not on file.   Social History Main Topics  . Smoking status: Former Smoker    Types: Cigars, Pipe    Quit date: 03/30/1984  . Smokeless tobacco: Never Used  . Alcohol use No  . Drug use: No  . Sexual activity: Not on file   Other Topics Concern  . Not on file   Social History Narrative  . No narrative on file    History reviewed. No pertinent family history.    Review of systems complete and found to be negative unless listed above      PHYSICAL EXAM  General: Well developed, well nourished, in no acute distress HEENT:  Normocephalic and atramatic Neck:  No JVD.  Lungs: Clear bilaterally to auscultation and percussion. Heart: HRRR . Normal S1 and S2 without gallops or murmurs.  Abdomen: Bowel sounds are positive, abdomen soft and non-tender  Msk:  Back normal, normal gait. Normal strength and tone for age. Extremities: No clubbing, cyanosis or edema.   Neuro: Alert and oriented X 3. Psych:  Good affect, responds appropriately  Labs:   Lab Results  Component Value Date   WBC 4.3 05/08/2016   HGB 12.9 (L) 05/08/2016   HCT 37.3 (L) 05/08/2016   MCV 87.7 05/08/2016   PLT 91 (L) 05/08/2016    Recent Labs Lab 05/08/16 0538  NA 143  K 3.6  CL 113*  CO2 26  BUN 22*  CREATININE 0.83  CALCIUM 8.6*  PROT 5.8*  BILITOT 0.5  ALKPHOS 36*  ALT 11*  AST 14*  GLUCOSE 98   Lab Results   Component Value Date   CKTOTAL 92 02/27/2014   CKMB 1.7 02/27/2014   TROPONINI <0.03 05/08/2016   No results found for: CHOL No results found for: HDL No results found for: LDLCALC No results found for: TRIG No results found for: CHOLHDL No results found for: LDLDIRECT    Radiology: Dg Chest 2 View  Result Date: 05/07/2016 CLINICAL DATA:  Intermittent left-sided chest pain 4 days worsening today. EXAM: CHEST  2 VIEW COMPARISON:  02/27/2014 FINDINGS: Lungs are adequately inflated without consolidation or effusion. Cardiomediastinal silhouette is within normal. There  are mild degenerate changes of the spine. Old left humeral head/neck fracture. IMPRESSION: No active cardiopulmonary disease. Electronically Signed   By: Marin Olp M.D.   On: 05/07/2016 18:11   Ct Abdomen Pelvis W Contrast  Result Date: 05/07/2016 CLINICAL DATA:  LEFT upper quadrant LEFT shoulder pain for 1 week no known injury, on Eliquis for blood clots, laryngeal cancer post radiation therapy in 2016, prior appendectomy, coronary artery disease post stenting, hypertension EXAM: CT ABDOMEN AND PELVIS WITH CONTRAST TECHNIQUE: Multidetector CT imaging of the abdomen and pelvis was performed using the standard protocol following bolus administration of intravenous contrast. Sagittal and coronal MPR images reconstructed from axial data set. CONTRAST:  144mL ISOVUE-300 IOPAMIDOL (ISOVUE-300) INJECTION 61% IV. No oral contrast administered. COMPARISON:  01/30/2015 FINDINGS: Lower chest: Calcified granuloma LEFT lower lobe image 5 stable. Lung bases otherwise clear. Hepatobiliary: Contracted gallbladder.  Liver normal appearance Pancreas: Normal appearance Spleen: Normal appearance Adrenals/Urinary Tract: Adrenal glands normal appearance. Kidneys, ureters, and bladder normal appearance. Minimal prostatic enlargement. Stomach/Bowel: Post appendectomy. Sigmoid diverticulosis without evidence of diverticulitis. Proximal stomach incompletely  distended, unable to exclude gastric wall thickening in this setting. Mildly prominent stool in rectum. Bowel loops otherwise normal appearance. Vascular/Lymphatic: Atherosclerotic calcifications aorta, iliac and femoral arteries. Aortic tortuosity without discrete aneurysm. No adenopathy. Reproductive: N/A Other: No free air or free fluid. No hernia or acute inflammatory process. Musculoskeletal: Osseous demineralization. Degenerative disc and facet disease changes lumbar spine. Multilevel vacuum phenomenon lumbar spine. 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 Grade 1 anterolisthesis L5-S1. Central acquired spinal stenosis and scattered lateral recess stenosis at multiple levels lumbar spine notably L3-L4 and L4-L5. No destructive bone lesion. IMPRESSION: Distal colonic diverticulosis without evidence of diverticulitis. No acute intra-abdominal or intrapelvic abnormalities. Aortic atherosclerosis. Extensive degenerative changes lumbar spine with spinal stenosis at L3-L4 and L4-L5 as well as grade 1 anterolisthesis at L5-S1 secondary to BILATERAL spondylolysis of L5. Electronically Signed   By: Lavonia Dana M.D.   On: 05/07/2016 19:54    EKG: Normal sinus rhythm  ASSESSMENT AND PLAN:   1. Left flank pain, atypical, normal ECG, negative troponin, likely noncardiac 2. Known CAD, status post stent LAD, with 60% stenosis left main  Recommendations  1. Agree with overall current therapy 2. Defer full dose anticoagulation 3. [place] study, can't be performed as outpatient later this week  Signed: Zubayr Bednarczyk MD,PhD, [facility] 05/08/2016, 9:22 AM

## 2016-05-08 NOTE — Discharge Instructions (Signed)
Resume your home medications.

## 2016-05-08 NOTE — Discharge Summary (Signed)
[place] [facility] [place] NAME: Matthew David    MR#:  [id number]  DATE OF BIRTH:  [birth year]  DATE OF ADMISSION:  05/07/2016 ADMITTING PHYSICIAN: Idelle Crouch, MD  DATE OF DISCHARGE: 05/08/2016 11:16 AM  PRIMARY CARE PHYSICIAN: Tama High III, MD    ADMISSION DIAGNOSIS:  Left upper quadrant abdominal tenderness [R10.812] Acute pain of left shoulder [M25.512] Chest pain, unspecified chest pain type [R07.9]  DISCHARGE DIAGNOSIS:  Principal Problem:   Chest pain Active Problems:   ASCVD (arteriosclerotic cardiovascular disease)   HLD (hyperlipidemia)   HTN (hypertension)   SECONDARY DIAGNOSIS:   Past Medical History:  Diagnosis Date  . Cancer [facility])    Larynx Cancer 03/2015, Rad tx's.  [name] DVT (deep venous thrombosis) ([place])   . Hx of appendectomy   . Hypertension   . Status post right knee replacement   . Stented coronary artery     HOSPITAL COURSE:   1. Chest pain unspecified going on for a few weeks. Cardiac enzymes were negative 3. Patient was admitted on Saturday and seen by me on Sunday. Unfortunately no availability of stress test at the time that he came in. Patient was seen in consultation by Dr. Lorinda Creed who will follow him up as outpatient and do an outpatient stress test. In reviewing previous cardiac catheterization back in 2015. He had a patent stent. He had a 60% blockage in his left main which was not hemodynamically significant at that time. Dr. Lorinda Creed will follow-up as outpatient. 2. Hiatal hernia and gastroesophageal reflux disease. Patient on Protonix and Carafate. 3. Laryngeal cancer history 4. Essential hypertension continue usual medication 5. Patient on eliquis for peripheral vascular disease left lower extremity 6. BPH on Flomax  DISCHARGE CONDITIONS:   Satisfactory  CONSULTS OBTAINED:  Treatment Team:  Isaias Cowman, MD  DRUG ALLERGIES:   Allergies  Allergen Reactions  . Gabapentin  Nausea Only    DISCHARGE MEDICATIONS:   Discharge Medication List as of 05/08/2016 10:40 AM    CONTINUE these medications which have NOT CHANGED   Details  Alum & Mag Hydroxide-Simeth (MAGIC MOUTHWASH) SOLN Take 10 mLs by mouth 4 (four) times daily as needed for mouth pain., Until Discontinued, Historical Med    apixaban (ELIQUIS) 5 MG TABS tablet Take 1 tablet (5 mg total) by mouth 2 (two) times daily., Starting 08/22/2015, Until Discontinued, Print    aspirin 81 MG tablet Take 81 mg by mouth daily after breakfast., Until Discontinued, Historical Med    azelastine (ASTELIN) 0.1 % nasal spray Place 1 spray into both nostrils 2 (two) times daily. Use in each nostril as directed, Until Discontinued, Historical Med    cetirizine (ZYRTEC) 10 MG tablet Take 10 mg by mouth daily., Until Discontinued, Historical Med    CRANBERRY CONCENTRATE PO Take 1 tablet by mouth 2 (two) times daily., Until Discontinued, Historical Med    fluticasone (FLONASE) 50 MCG/ACT nasal spray Place 2 sprays into both nostrils daily., Until Discontinued, Historical Med    HYDROcodone-acetaminophen (NORCO/VICODIN) 5-325 MG tablet Take 1 tablet by mouth every 6 (six) hours as needed for moderate pain., Until Discontinued, Historical Med    LORazepam (ATIVAN) 1 MG tablet Take 1 mg by mouth at bedtime., Until Discontinued, Historical Med    lovastatin (MEVACOR) 20 MG tablet Take 20 mg by mouth at bedtime., Until Discontinued, Historical Med    Lysine 1000 MG TABS Take 1 tablet by mouth every morning., Until Discontinued, Historical Med  metoprolol tartrate (LOPRESSOR) 25 MG tablet Take 12.5 mg by mouth 2 (two) times daily., Until Discontinued, Historical Med    mirtazapine (REMERON) 15 MG tablet Take 7.5 mg by mouth at bedtime as needed (0.5 tablet at bedtime for sleep). , Until Discontinued, Historical Med    multivitamin-iron-minerals-folic acid (CENTRUM) chewable tablet Chew 1 tablet by mouth every morning.,  Until Discontinued, Historical Med    oxyCODONE-acetaminophen (PERCOCET) 7.5-325 MG tablet Starting 01/29/2016, Until Discontinued, Historical Med    pantoprazole (PROTONIX) 40 MG tablet Take 40 mg by mouth 2 (two) times daily., Until Discontinued, Historical Med    Probiotic Product (ALIGN PO) Take 1 capsule by mouth every morning., Until Discontinued, Historical Med    Saw Palmetto-Phytosterols (PROSTATE SR PO) Take 1 tablet by mouth 2 (two) times daily., Until Discontinued, Historical Med    sucralfate (CARAFATE) 1 g tablet TAKE ONE TABLET 3 TIMES DAILY WITH MEALS, Normal    tamsulosin (FLOMAX) 0.4 MG CAPS capsule Take 0.4 mg by mouth 2 (two) times daily., Until Discontinued, Historical Med    valsartan-hydrochlorothiazide (DIOVAN-HCT) 80-12.5 MG tablet Starting 01/11/2016, Until Discontinued, Historical Med      STOP taking these medications     celecoxib (CELEBREX) 200 MG capsule      dexamethasone (DECADRON) 4 MG tablet      doxycycline (VIBRAMYCIN) 100 MG capsule      lidocaine (XYLOCAINE) 2 % solution          DISCHARGE INSTRUCTIONS:   Follow-up with Dr. Lorinda Creed this week Follow-up medical doctor one week  If you experience worsening of your admission symptoms, develop shortness of breath, life threatening emergency, suicidal or homicidal thoughts you must seek medical attention immediately by calling 911 or calling your MD immediately  if symptoms less severe.  You Must read complete instructions/literature along with all the possible adverse reactions/side effects for all the Medicines you take and that have been prescribed to you. Take any new Medicines after you have completely understood and accept all the possible adverse reactions/side effects.   Please note  You were cared for by a hospitalist during your hospital stay. If you have any questions about your discharge medications or the care you received while you were in the hospital after you are discharged,  you can call the unit and asked to speak with the hospitalist on call if the hospitalist that took care of you is not available. Once you are discharged, your primary care physician will handle any further medical issues. Please note that NO REFILLS for any discharge medications will be authorized once you are discharged, as it is imperative that you return to your primary care physician (or establish a relationship with a primary care physician if you do not have one) for your aftercare needs so that they can reassess your need for medications and monitor your lab values.    Today   CHIEF COMPLAINT:   Chief Complaint  Patient presents with  . Chest Pain    HISTORY OF PRESENT ILLNESS:  Matthew David  is a 82 y.o. male presented with chest pain going on for a few weeks.   VITAL SIGNS:  Blood pressure 127/64, pulse 61, temperature 98.2 F (36.8 C), temperature source Oral, resp. rate 18, height 5\' 8"  (1.727 m), weight 71.4 kg (157 lb 8 oz), SpO2 96 %.    PHYSICAL EXAMINATION:  GENERAL:  82 y.o.-year-old patient lying in the bed with no acute distress.  EYES: Pupils equal, round, reactive to light and  accommodation. No scleral icterus. Extraocular muscles intact.  HEENT: Head atraumatic, normocephalic. Oropharynx and nasopharynx clear.  NECK:  Supple, no jugular venous distention. No thyroid enlargement, no tenderness.  LUNGS: Normal breath sounds bilaterally, no wheezing, rales,rhonchi or crepitation. No use of accessory muscles of respiration.  CARDIOVASCULAR: S1, S2 normal. No murmurs, rubs, or gallops.  ABDOMEN: Soft, non-tender, non-distended. Bowel sounds present. No organomegaly or mass.  EXTREMITIES: No pedal edema, cyanosis, or clubbing.  NEUROLOGIC: Cranial nerves II through XII are intact. Muscle strength 5/5 in all extremities. Sensation intact. Gait not checked.  PSYCHIATRIC: The patient is alert and oriented x 3.  SKIN: No obvious rash, lesion, or ulcer.   DATA REVIEW:    CBC  Recent Labs Lab 05/08/16 0538  WBC 4.3  HGB 12.9*  HCT 37.3*  PLT 91*    Chemistries   Recent Labs Lab 05/08/16 0538  NA 143  K 3.6  CL 113*  CO2 26  GLUCOSE 98  BUN 22*  CREATININE 0.83  CALCIUM 8.6*  AST 14*  ALT 11*  ALKPHOS 36*  BILITOT 0.5    Cardiac Enzymes  Recent Labs Lab 05/08/16 0538  TROPONINI <0.03    RADIOLOGY:  Dg Chest 2 View  Result Date: 05/07/2016 CLINICAL DATA:  Intermittent left-sided chest pain 4 days worsening today. EXAM: CHEST  2 VIEW COMPARISON:  02/27/2014 FINDINGS: Lungs are adequately inflated without consolidation or effusion. Cardiomediastinal silhouette is within normal. There are mild degenerate changes of the spine. Old left humeral head/neck fracture. IMPRESSION: No active cardiopulmonary disease. Electronically Signed   By: Marin Olp M.D.   On: 05/07/2016 18:11   Ct Abdomen Pelvis W Contrast  Result Date: 05/07/2016 CLINICAL DATA:  LEFT upper quadrant LEFT shoulder pain for 1 week no known injury, on Eliquis for blood clots, laryngeal cancer post radiation therapy in 2016, prior appendectomy, coronary artery disease post stenting, hypertension EXAM: CT ABDOMEN AND PELVIS WITH CONTRAST TECHNIQUE: Multidetector CT imaging of the abdomen and pelvis was performed using the standard protocol following bolus administration of intravenous contrast. Sagittal and coronal MPR images reconstructed from axial data set. CONTRAST:  169mL ISOVUE-300 IOPAMIDOL (ISOVUE-300) INJECTION 61% IV. No oral contrast administered. COMPARISON:  01/30/2015 FINDINGS: Lower chest: Calcified granuloma LEFT lower lobe image 5 stable. Lung bases otherwise clear. Hepatobiliary: Contracted gallbladder.  Liver normal appearance Pancreas: Normal appearance Spleen: Normal appearance Adrenals/Urinary Tract: Adrenal glands normal appearance. Kidneys, ureters, and bladder normal appearance. Minimal prostatic enlargement. Stomach/Bowel: Post appendectomy. Sigmoid  diverticulosis without evidence of diverticulitis. Proximal stomach incompletely distended, unable to exclude gastric wall thickening in this setting. Mildly prominent stool in rectum. Bowel loops otherwise normal appearance. Vascular/Lymphatic: Atherosclerotic calcifications aorta, iliac and femoral arteries. Aortic tortuosity without discrete aneurysm. No adenopathy. Reproductive: N/A Other: No free air or free fluid. No hernia or acute inflammatory process. Musculoskeletal: Osseous demineralization. Degenerative disc and facet disease changes lumbar spine. Multilevel vacuum phenomenon lumbar spine. 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 Grade 1 anterolisthesis L5-S1. Central acquired spinal stenosis and scattered lateral recess stenosis at multiple levels lumbar spine notably L3-L4 and L4-L5. No destructive bone lesion. IMPRESSION: Distal colonic diverticulosis without evidence of diverticulitis. No acute intra-abdominal or intrapelvic abnormalities. Aortic atherosclerosis. Extensive degenerative changes lumbar spine with spinal stenosis at L3-L4 and L4-L5 as well as grade 1 anterolisthesis at L5-S1 secondary to BILATERAL spondylolysis of L5. Electronically Signed   By: Lavonia Dana M.D.   On: 05/07/2016 19:54     Management plans discussed with the patient, family and  they are in agreement.  CODE STATUS:     Code Status Orders        Start     Ordered   05/07/16 2242  Full code  Continuous     05/07/16 2241    Code Status History    Date Active Date Inactive Code Status Order ID Comments User Context   08/13/2015 11:01 PM 08/15/2015  6:15 PM Full Code [id number]  Nicholes Mango, MD Inpatient    Advance Directive Documentation   Flowsheet Row Most Recent Value  Type of Advance Directive  Living will, Healthcare Power of Attorney  Pre-existing out of facility DNR order (yellow form or pink MOST form)  No data  "MOST" Form in Place?  No data      TOTAL TIME TAKING CARE OF THIS PATIENT: 35  minutes.    Loletha Grayer M.D on 05/08/2016 at 12:57 PM  Between 7am to 6pm - Pager - [phone]  After 6pm go to [email or web address] - password [email or web address]  [facility] Office  [phone]  CC: Primary care physician; Adin Hector, MD

## 2016-05-08 NOTE — Progress Notes (Signed)
Patient given discharge teaching and paperwork regarding medications, diet, follow-up appointments and activity. Patient understanding verbalized. No complaints at this time. IV and telemetry discontinued prior to leaving. Skin assessment as previously charted and vitals are stable; on room air. Patient being discharged to home. Caregiver/family present during discharge teaching. [place] with daughter.

## 2016-05-08 NOTE — Plan of Care (Signed)
Problem: Phase II Progression Outcomes Goal: Stress Test if indicated Outcome: Not Applicable Date Met: 58/72/76 To follow-up outpatient with cardiology

## 2016-05-11 ENCOUNTER — Encounter (INDEPENDENT_AMBULATORY_CARE_PROVIDER_SITE_OTHER): Payer: Self-pay

## 2016-05-13 DIAGNOSIS — I700 Atherosclerosis of aorta: Secondary | ICD-10-CM | POA: Insufficient documentation

## 2016-06-14 ENCOUNTER — Encounter (INDEPENDENT_AMBULATORY_CARE_PROVIDER_SITE_OTHER): Payer: Self-pay | Admitting: Vascular Surgery

## 2016-06-14 ENCOUNTER — Ambulatory Visit (INDEPENDENT_AMBULATORY_CARE_PROVIDER_SITE_OTHER): Payer: Medicare Other | Admitting: Vascular Surgery

## 2016-06-14 VITALS — BP 112/72 | HR 69 | Resp 16 | Wt 163.0 lb

## 2016-06-14 DIAGNOSIS — C329 Malignant neoplasm of larynx, unspecified: Secondary | ICD-10-CM

## 2016-06-14 DIAGNOSIS — E785 Hyperlipidemia, unspecified: Secondary | ICD-10-CM

## 2016-06-14 DIAGNOSIS — I10 Essential (primary) hypertension: Secondary | ICD-10-CM | POA: Diagnosis not present

## 2016-06-14 DIAGNOSIS — I82512 Chronic embolism and thrombosis of left femoral vein: Secondary | ICD-10-CM

## 2016-06-14 NOTE — Assessment & Plan Note (Signed)
The patient is doing well and has had about 10 months of anticoagulation for his extensive left lower extremity DVT. His pain and swelling are significantly better. I will see him back in about 3 months and at that point we can be off of anticoagulation and see how he is doing. Continue to wear compression stockings and elevate his leg.

## 2016-06-14 NOTE — Assessment & Plan Note (Signed)
lipid control important in reducing the progression of atherosclerotic disease. Continue statin therapy  

## 2016-06-14 NOTE — Progress Notes (Signed)
MRN : [id number]  Matthew David is a 82 y.o. (1922/01/03) male who presents with chief complaint of  Chief Complaint  Patient presents with  . Follow-up  .  History of Present Illness: Patient returns today in follow up of His left lower extremity DVT. He has been diligently wearing his zipper compression stockings. The swelling in that leg is almost completely gone this point. He reports that the pain is about 40-50% better but not entirely gone. He continues on anticoagulation and has been on this for about 10 months.  Current Outpatient Prescriptions  Medication Sig Dispense Refill  . acetaminophen (TYLENOL) 650 MG CR tablet Take by mouth.    . Amino Acids-Protein Hydrolys (FEEDING SUPPLEMENT, PRO-STAT SUGAR FREE 64,) LIQD Take 30 mLs by mouth 3 (three) times daily with meals.    [name] apixaban (ELIQUIS) 5 MG TABS tablet Take 1 tablet (5 mg total) by mouth 2 (two) times daily. 60 tablet 0  . aspirin 81 MG tablet Take 81 mg by mouth daily after breakfast.    . cetirizine (ZYRTEC) 10 MG tablet Take 10 mg by mouth daily.    [name] CRANBERRY CONCENTRATE PO Take 1 tablet by mouth 2 (two) times daily.    . fluticasone (FLONASE) 50 MCG/ACT nasal spray Place 2 sprays into both nostrils daily.    [name] LORazepam (ATIVAN) 1 MG tablet Take 1 mg by mouth at bedtime.    . lovastatin (MEVACOR) 20 MG tablet Take 20 mg by mouth at bedtime.    . metoprolol tartrate (LOPRESSOR) 25 MG tablet Take 12.5 mg by mouth 2 (two) times daily.    . mirtazapine (REMERON) 15 MG tablet Take 7.5 mg by mouth at bedtime as needed (0.5 tablet at bedtime for sleep).     [name] oxyCODONE-acetaminophen (PERCOCET) 7.5-325 MG tablet     . pantoprazole (PROTONIX) 40 MG tablet Take 40 mg by mouth 2 (two) times daily.    Clarnce Flock Palmetto-Phytosterols (PROSTATE SR PO) Take 1 tablet by mouth 2 (two) times daily.    . sucralfate (CARAFATE) 1 g tablet TAKE ONE TABLET 3 TIMES DAILY WITH MEALS 90 tablet 11  . tamsulosin (FLOMAX) 0.4 MG CAPS capsule  Take 0.4 mg by mouth 2 (two) times daily.    . valsartan-hydrochlorothiazide (DIOVAN-HCT) 80-12.5 MG tablet     . Alum & Mag Hydroxide-Simeth (MAGIC MOUTHWASH) SOLN Take 10 mLs by mouth 4 (four) times daily as needed for mouth pain.    [name] azelastine (ASTELIN) 0.1 % nasal spray Place 1 spray into both nostrils 2 (two) times daily. Use in each nostril as directed    . benzonatate (TESSALON) 100 MG capsule Take by mouth.    [name] HYDROcodone-acetaminophen (NORCO/VICODIN) 5-325 MG tablet Take 1 tablet by mouth every 6 (six) hours as needed for moderate pain.    [name] Lysine 1000 MG TABS Take 1 tablet by mouth every morning.    . multivitamin-iron-minerals-folic acid (CENTRUM) chewable tablet Chew 1 tablet by mouth every morning.    . Probiotic Product (ALIGN PO) Take 1 capsule by mouth every morning.     No current facility-administered medications for this visit.     Past Medical History:  Diagnosis Date  . Cancer [facility])    Larynx Cancer 03/2015, Rad tx's.  [name] DVT (deep venous thrombosis) ([place])   . Hx of appendectomy   . Hypertension   . Status post right knee replacement   . Stented coronary artery     Past Surgical History:  Procedure Laterality Date  . APPENDECTOMY  1944  . CATARACT EXTRACTION W/ INTRAOCULAR LENS  IMPLANT, BILATERAL Bilateral 2011   both eye done in the same year  . CORONARY ANGIOPLASTY WITH STENT PLACEMENT  2012  . DIRECT LARYNGOSCOPY N/A 04/08/2015   Procedure: DIRECT LARYNGOSCOPY;  Surgeon: Clyde Canterbury, MD;  Location: [facility] ORS;  Service: ENT;  Laterality: N/A;  . JOINT REPLACEMENT Right 2008   knee  . KNEE ARTHROSCOPY Right 2006  . SHOULDER SURGERY Left 2006  . TONSILLECTOMY  1952    Social History Social History  Substance Use Topics  . Smoking status: Former Smoker    Types: Cigars, Pipe    Quit date: 03/30/1984  . Smokeless tobacco: Never Used  . Alcohol use No     Family History No bleeding disorders  Allergies  Allergen Reactions  . Gabapentin Nausea  Only     REVIEW OF SYSTEMS (Negative unless checked)  Constitutional: '[]' Weight loss  '[]' Fever  '[]' Chills Cardiac: '[]' Chest pain   '[]' Chest pressure   '[]' Palpitations   '[]' Shortness of breath when laying flat   '[]' Shortness of breath at rest   '[]' Shortness of breath with exertion. Vascular:  '[]' Pain in legs with walking   '[x]' Pain in legs at rest   '[]' Pain in legs when laying flat   '[]' Claudication   '[]' Pain in feet when walking  '[]' Pain in feet at rest  '[]' Pain in feet when laying flat   '[x]' History of DVT   '[]' Phlebitis   '[x]' Swelling in legs   '[]' Varicose veins   '[]' Non-healing ulcers Pulmonary:   '[]' Uses home oxygen   '[]' Productive cough   '[]' Hemoptysis   '[]' Wheeze  '[]' COPD   '[]' Asthma Neurologic:  '[]' Dizziness  '[]' Blackouts   '[]' Seizures   '[]' History of stroke   '[]' History of TIA  '[]' Aphasia   '[]' Temporary blindness   '[]' Dysphagia   '[]' Weakness or numbness in arms   '[]' Weakness or numbness in legs Musculoskeletal:  '[]' Arthritis   '[]' Joint swelling   '[]' Joint pain   '[]' Low back pain Hematologic:  '[]' Easy bruising  '[]' Easy bleeding   '[]' Hypercoagulable state   '[]' Anemic   Gastrointestinal:  '[]' Blood in stool   '[]' Vomiting blood  '[]' Gastroesophageal reflux/heartburn   '[]' Abdominal pain Genitourinary:  '[]' Chronic kidney disease   '[]' Difficult urination  '[]' Frequent urination  '[]' Burning with urination   '[]' Hematuria Skin:  '[]' Rashes   '[]' Ulcers   '[]' Wounds Psychological:  '[]' History of anxiety   '[]'  History of major depression.  Physical Examination  BP 112/72   Pulse 69   Resp 16   Wt 163 lb (73.9 kg)   BMI 24.78 kg/m  Gen:  WD/WN, NAD Head: [place]/AT, No temporalis wasting. Ear/Nose/Throat: Hearing grossly intact, nares w/o erythema or drainage, trachea midline Eyes: Conjunctiva clear. Sclera non-icteric Neck: Supple.  No JVD.  Pulmonary:  Good air movement, no use of accessory muscles.  Cardiac: RRR, normal S1, S2 Vascular:  Vessel Right Left  Radial Palpable Palpable                                   Gastrointestinal: soft,  non-tender/non-distended. No guarding/reflex.  Musculoskeletal: M/S 5/5 throughout.  No deformity or atrophy. Trace BLE edema. Neurologic: Sensation grossly intact in extremities.  Symmetrical.  Speech is fluent.  Psychiatric: Judgment intact, Mood & affect appropriate for pt's clinical situation. Dermatologic: No rashes or ulcers noted.  No cellulitis or open wounds. Lymph : No Cervical, Axillary, or Inguinal lymphadenopathy.  Labs Recent Results (from the past 2160 hour(s))  Basic metabolic panel     Status: Abnormal   Collection Time: 05/07/16  5:40 PM  Result Value Ref Range   Sodium 139 135 - 145 mmol/L   Potassium 3.5 3.5 - 5.1 mmol/L   Chloride 109 101 - 111 mmol/L   CO2 23 22 - 32 mmol/L   Glucose, Bld 113 (H) 65 - 99 mg/dL   BUN 27 (H) 6 - 20 mg/dL   Creatinine, Ser 1.06 0.61 - 1.24 mg/dL   Calcium 9.3 8.9 - 10.3 mg/dL   GFR calc non Af Amer 59 (L) >60 mL/min   GFR calc Af Amer >60 >60 mL/min    Comment: (NOTE) The eGFR has been calculated using the CKD EPI equation. This calculation has not been validated in all clinical situations. eGFR's persistently <60 mL/min signify possible Chronic Kidney Disease.    Anion gap 7 5 - 15  CBC     Status: Abnormal   Collection Time: 05/07/16  5:40 PM  Result Value Ref Range   WBC 6.1 3.8 - 10.6 K/uL   RBC 4.48 4.40 - 5.90 MIL/uL   Hemoglobin 13.6 13.0 - 18.0 g/dL   HCT 39.0 (L) 40.0 - 52.0 %   MCV 87.1 80.0 - 100.0 fL   MCH 30.3 26.0 - 34.0 pg   MCHC 34.8 32.0 - 36.0 g/dL   RDW 13.7 11.5 - 14.5 %   Platelets 103 (L) 150 - 440 K/uL  Troponin I     Status: None   Collection Time: 05/07/16  5:40 PM  Result Value Ref Range   Troponin I <0.03 <0.03 ng/mL  Hepatic function panel     Status: Abnormal   Collection Time: 05/07/16  5:40 PM  Result Value Ref Range   Total Protein 6.7 6.5 - 8.1 g/dL   Albumin 4.3 3.5 - 5.0 g/dL   AST 19 15 - 41 U/L   ALT 14 (L) 17 - 63 U/L   Alkaline Phosphatase 43 38 - 126 U/L   Total  Bilirubin 0.5 0.3 - 1.2 mg/dL   Bilirubin, Direct 0.1 0.1 - 0.5 mg/dL   Indirect Bilirubin 0.4 0.3 - 0.9 mg/dL  Protime-INR     Status: None   Collection Time: 05/07/16  5:40 PM  Result Value Ref Range   Prothrombin Time 15.2 11.4 - 15.2 seconds   INR 1.19   Troponin I     Status: None   Collection Time: 05/07/16  8:38 PM  Result Value Ref Range   Troponin I <0.03 <0.03 ng/mL  Troponin I     Status: None   Collection Time: 05/07/16 11:16 PM  Result Value Ref Range   Troponin I <0.03 <0.03 ng/mL  Troponin I     Status: None   Collection Time: 05/08/16  5:38 AM  Result Value Ref Range   Troponin I <0.03 <0.03 ng/mL  CBC     Status: Abnormal   Collection Time: 05/08/16  5:38 AM  Result Value Ref Range   WBC 4.3 3.8 - 10.6 K/uL   RBC 4.25 (L) 4.40 - 5.90 MIL/uL   Hemoglobin 12.9 (L) 13.0 - 18.0 g/dL   HCT 37.3 (L) 40.0 - 52.0 %   MCV 87.7 80.0 - 100.0 fL   MCH 30.3 26.0 - 34.0 pg   MCHC 34.6 32.0 - 36.0 g/dL   RDW 13.9 11.5 - 14.5 %   Platelets 91 (L) 150 - 440 K/uL  Comprehensive  metabolic panel     Status: Abnormal   Collection Time: 05/08/16  5:38 AM  Result Value Ref Range   Sodium 143 135 - 145 mmol/L   Potassium 3.6 3.5 - 5.1 mmol/L   Chloride 113 (H) 101 - 111 mmol/L   CO2 26 22 - 32 mmol/L   Glucose, Bld 98 65 - 99 mg/dL   BUN 22 (H) 6 - 20 mg/dL   Creatinine, Ser 0.83 0.61 - 1.24 mg/dL   Calcium 8.6 (L) 8.9 - 10.3 mg/dL   Total Protein 5.8 (L) 6.5 - 8.1 g/dL   Albumin 3.6 3.5 - 5.0 g/dL   AST 14 (L) 15 - 41 U/L   ALT 11 (L) 17 - 63 U/L   Alkaline Phosphatase 36 (L) 38 - 126 U/L   Total Bilirubin 0.5 0.3 - 1.2 mg/dL   GFR calc non Af Amer >60 >60 mL/min   GFR calc Af Amer >60 >60 mL/min    Comment: (NOTE) The eGFR has been calculated using the CKD EPI equation. This calculation has not been validated in all clinical situations. eGFR's persistently <60 mL/min signify possible Chronic Kidney Disease.    Anion gap 4 (L) 5 - 15    Radiology No results  found.    Assessment/Plan  HLD (hyperlipidemia) lipid control important in reducing the progression of atherosclerotic disease. Continue statin therapy   HTN (hypertension) blood pressure control important in reducing the progression of atherosclerotic disease. On appropriate oral medications.   Left leg DVT ([place]) The patient is doing well and has had about 10 months of anticoagulation for his extensive left lower extremity DVT. His pain and swelling are significantly better. I will see him back in about 3 months and at that point we can be off of anticoagulation and see how he is doing. Continue to wear compression stockings and elevate his leg.    Leotis Pain, MD  06/14/2016 11:11 AM    This note was created with Dragon medical transcription system.  Any errors from dictation are purely unintentional

## 2016-06-14 NOTE — Assessment & Plan Note (Signed)
blood pressure control important in reducing the progression of atherosclerotic disease. On appropriate oral medications.  

## 2016-08-19 ENCOUNTER — Ambulatory Visit (INDEPENDENT_AMBULATORY_CARE_PROVIDER_SITE_OTHER): Payer: Medicare Other

## 2016-08-19 ENCOUNTER — Encounter (INDEPENDENT_AMBULATORY_CARE_PROVIDER_SITE_OTHER): Payer: Self-pay | Admitting: Vascular Surgery

## 2016-08-19 ENCOUNTER — Other Ambulatory Visit (INDEPENDENT_AMBULATORY_CARE_PROVIDER_SITE_OTHER): Payer: Self-pay | Admitting: Vascular Surgery

## 2016-08-19 ENCOUNTER — Ambulatory Visit (INDEPENDENT_AMBULATORY_CARE_PROVIDER_SITE_OTHER): Payer: Medicare Other | Admitting: Vascular Surgery

## 2016-08-19 VITALS — BP 142/71 | HR 61 | Resp 16 | Ht 66.0 in | Wt 161.0 lb

## 2016-08-19 DIAGNOSIS — I82512 Chronic embolism and thrombosis of left femoral vein: Secondary | ICD-10-CM | POA: Diagnosis not present

## 2016-08-19 DIAGNOSIS — Z86718 Personal history of other venous thrombosis and embolism: Secondary | ICD-10-CM

## 2016-08-19 DIAGNOSIS — I10 Essential (primary) hypertension: Secondary | ICD-10-CM

## 2016-08-19 DIAGNOSIS — M79605 Pain in left leg: Secondary | ICD-10-CM | POA: Diagnosis not present

## 2016-08-19 DIAGNOSIS — E785 Hyperlipidemia, unspecified: Secondary | ICD-10-CM | POA: Diagnosis not present

## 2016-08-19 DIAGNOSIS — M79609 Pain in unspecified limb: Secondary | ICD-10-CM | POA: Insufficient documentation

## 2016-08-19 NOTE — Assessment & Plan Note (Signed)
Given the duplex findings today, I think this is much more likely to be a musculoskeletal or neuropathic issue.

## 2016-08-19 NOTE — Assessment & Plan Note (Signed)
Duplex was done today for further evaluation to ensure that a new DVT was not present. Only chronic appearing DVT was seen in the left femoral vein. No acute DVT was identified. I believe that his pain is more likely secondary to neuropathic pain or musculoskeletal issues. He is going to stop his anticoagulation as he has been on it for 1 year. I'll plan to see him back in 3-4 months to see how he is doing at that point.

## 2016-08-19 NOTE — Progress Notes (Signed)
MRN : [id number]  Matthew David is a 82 y.o. ([birth year]) male who presents with chief complaint of  Chief Complaint  Patient presents with  . Re-evaluation    Left upper thigh pain  .  History of Present Illness: Patient returns today in follow up of DVT. He has been on anticoagulation for almost a year now for an extensive left lower extremity DVT. This was doing well. The leg swelling and pain had markedly improved. About 2-3 weeks ago he developed worsening pain particularly on the outside of the left leg. He was very concerned this could be a new blood clot.         Current Outpatient Prescriptions  Medication Sig Dispense Refill  . acetaminophen (TYLENOL) 650 MG CR tablet Take by mouth.    . Amino Acids-Protein Hydrolys (FEEDING SUPPLEMENT, PRO-STAT SUGAR FREE 64,) LIQD Take 30 mLs by mouth 3 (three) times daily with meals.    [name] apixaban (ELIQUIS) 5 MG TABS tablet Take 1 tablet (5 mg total) by mouth 2 (two) times daily. 60 tablet 0  . aspirin 81 MG tablet Take 81 mg by mouth daily after breakfast.    . cetirizine (ZYRTEC) 10 MG tablet Take 10 mg by mouth daily.    [name] CRANBERRY CONCENTRATE PO Take 1 tablet by mouth 2 (two) times daily.    . fluticasone (FLONASE) 50 MCG/ACT nasal spray Place 2 sprays into both nostrils daily.    [name] LORazepam (ATIVAN) 1 MG tablet Take 1 mg by mouth at bedtime.    . lovastatin (MEVACOR) 20 MG tablet Take 20 mg by mouth at bedtime.    . metoprolol tartrate (LOPRESSOR) 25 MG tablet Take 12.5 mg by mouth 2 (two) times daily.    . mirtazapine (REMERON) 15 MG tablet Take 7.5 mg by mouth at bedtime as needed (0.5 tablet at bedtime for sleep).     [name] oxyCODONE-acetaminophen (PERCOCET) 7.5-325 MG tablet     . pantoprazole (PROTONIX) 40 MG tablet Take 40 mg by mouth 2 (two) times daily.    Clarnce Flock Palmetto-Phytosterols (PROSTATE SR PO) Take 1 tablet by mouth 2 (two) times daily.    . sucralfate (CARAFATE) 1 g tablet TAKE ONE TABLET 3  TIMES DAILY WITH MEALS 90 tablet 11  . tamsulosin (FLOMAX) 0.4 MG CAPS capsule Take 0.4 mg by mouth 2 (two) times daily.    . valsartan-hydrochlorothiazide (DIOVAN-HCT) 80-12.5 MG tablet     . Alum & Mag Hydroxide-Simeth (MAGIC MOUTHWASH) SOLN Take 10 mLs by mouth 4 (four) times daily as needed for mouth pain.    [name] azelastine (ASTELIN) 0.1 % nasal spray Place 1 spray into both nostrils 2 (two) times daily. Use in each nostril as directed    . benzonatate (TESSALON) 100 MG capsule Take by mouth.    [name] HYDROcodone-acetaminophen (NORCO/VICODIN) 5-325 MG tablet Take 1 tablet by mouth every 6 (six) hours as needed for moderate pain.    [name] Lysine 1000 MG TABS Take 1 tablet by mouth every morning.    . multivitamin-iron-minerals-folic acid (CENTRUM) chewable tablet Chew 1 tablet by mouth every morning.    . Probiotic Product (ALIGN PO) Take 1 capsule by mouth every morning.     No current facility-administered medications for this visit.         Past Medical History:  Diagnosis Date  . 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  [name] DVT (deep venous thrombosis) ([place])   . Hx of appendectomy   .  Hypertension   . Status post right knee replacement   . Stented coronary artery          Past Surgical History:  Procedure Laterality Date  . APPENDECTOMY  1944  . CATARACT EXTRACTION W/ INTRAOCULAR LENS  IMPLANT, BILATERAL Bilateral 2011   both eye done in the same year  . CORONARY ANGIOPLASTY WITH STENT PLACEMENT  2012  . DIRECT LARYNGOSCOPY N/A 04/08/2015   Procedure: DIRECT LARYNGOSCOPY;  Surgeon: Clyde Canterbury, MD;  Location: [facility] ORS;  Service: ENT;  Laterality: N/A;  . JOINT REPLACEMENT Right 2008   knee  . KNEE ARTHROSCOPY Right 2006  . SHOULDER SURGERY Left 2006  . TONSILLECTOMY  1952    Social History      Social History  Substance Use Topics  . Smoking status: Former Smoker    Types: Cigars, Pipe    Quit date: 03/30/1984  . Smokeless  tobacco: Never Used  . Alcohol use No     Family History No bleeding disorders      Allergies  Allergen Reactions  . Gabapentin Nausea Only     REVIEW OF SYSTEMS (Negative unless checked)  Constitutional: [] Weight loss  [] Fever  [] Chills Cardiac: [] Chest pain   [] Chest pressure   [] Palpitations   [] Shortness of breath when laying flat   [] Shortness of breath at rest   [] Shortness of breath with exertion. Vascular:  [] Pain in legs with walking   [x] Pain in legs at rest   [] Pain in legs when laying flat   [] Claudication   [] Pain in feet when walking  [] Pain in feet at rest  [] Pain in feet when laying flat   [x] History of DVT   [] Phlebitis   [x] Swelling in legs   [] Varicose veins   [] Non-healing ulcers Pulmonary:   [] Uses home oxygen   [] Productive cough   [] Hemoptysis   [] Wheeze  [] COPD   [] Asthma Neurologic:  [] Dizziness  [] Blackouts   [] Seizures   [] History of stroke   [] History of TIA  [] Aphasia   [] Temporary blindness   [] Dysphagia   [] Weakness or numbness in arms   [] Weakness or numbness in legs Musculoskeletal:  [] Arthritis   [] Joint swelling   [] Joint pain   [] Low back pain Hematologic:  [] Easy bruising  [] Easy bleeding   [] Hypercoagulable state   [] Anemic   Gastrointestinal:  [] Blood in stool   [] Vomiting blood  [] Gastroesophageal reflux/heartburn   [] Abdominal pain Genitourinary:  [] Chronic kidney disease   [] Difficult urination  [] Frequent urination  [] Burning with urination   [] Hematuria Skin:  [] Rashes   [] Ulcers   [] Wounds Psychological:  [] History of anxiety   []  History of major depression.  Physical Examination  BP 112/72   Pulse 69   Resp 16   Wt 163 lb (73.9 kg)   BMI 24.78 kg/m  Gen:  WD/WN, NAD Head: [place]/AT, No temporalis wasting. Ear/Nose/Throat: Hearing grossly intact, nares w/o erythema or drainage, trachea midline Eyes: Conjunctiva clear. Sclera non-icteric Neck: Supple.  No JVD.  Pulmonary:  Good air movement, no use of accessory muscles.    Cardiac: RRR, normal S1, S2 Vascular:  Vessel Right Left  Radial Palpable Palpable                                   Gastrointestinal: soft, non-tender/non-distended. No guarding/reflex.  Musculoskeletal: M/S 5/5 throughout.  No deformity or atrophy. Trace BLE edema. Neurologic: Sensation grossly intact in extremities.  Symmetrical.  Speech  is fluent.  Psychiatric: Judgment intact, Mood & affect appropriate for pt's clinical situation. Dermatologic: No rashes or ulcers noted.  No cellulitis or open wounds. Lymph : No Cervical, Axillary, or Inguinal lymphadenopathy.      Labs No results found for this or any previous visit (from the past 2160 hour(s)).  Radiology No results found.    Assessment/Plan  HLD (hyperlipidemia) lipid control important in reducing the progression of atherosclerotic disease. Continue statin therapy   HTN (hypertension) blood pressure control important in reducing the progression of atherosclerotic disease. On appropriate oral medications.   Left leg DVT (HCC) Duplex was done today for further evaluation to ensure that a new DVT was not present. Only chronic appearing DVT was seen in the left femoral vein. No acute DVT was identified. I believe that his pain is more likely secondary to neuropathic pain or musculoskeletal issues. He is going to stop his anticoagulation as he has been on it for 1 year. I'll plan to see him back in 3-4 months to see how he is doing at that point.  Pain in limb Given the duplex findings today, I think this is much more likely to be a musculoskeletal or neuropathic issue.    Leotis Pain, MD  08/19/2016 5:18 PM    This note was created with Dragon medical transcription system.  Any errors from dictation are purely unintentional

## 2016-08-19 NOTE — Assessment & Plan Note (Signed)
blood pressure control important in reducing the progression of atherosclerotic disease. On appropriate oral medications.  

## 2016-08-19 NOTE — Assessment & Plan Note (Signed)
lipid control important in reducing the progression of atherosclerotic disease. Continue statin therapy  

## 2016-08-25 ENCOUNTER — Encounter: Payer: Self-pay | Admitting: Radiation Oncology

## 2016-08-25 ENCOUNTER — Other Ambulatory Visit: Payer: Self-pay | Admitting: *Deleted

## 2016-08-25 ENCOUNTER — Ambulatory Visit
Admission: RE | Admit: 2016-08-25 | Discharge: 2016-08-25 | Disposition: A | Discharge status: Home / Self Care | Payer: Medicare Other | Source: Ambulatory Visit | Attending: Radiation Oncology | Admitting: Radiation Oncology

## 2016-08-25 VITALS — BP 137/63 | HR 69 | Temp 98.0°F | Wt 162.7 lb

## 2016-08-25 DIAGNOSIS — R1310 Dysphagia, unspecified: Secondary | ICD-10-CM | POA: Insufficient documentation

## 2016-08-25 DIAGNOSIS — Z8521 Personal history of malignant neoplasm of larynx: Secondary | ICD-10-CM | POA: Diagnosis not present

## 2016-08-25 DIAGNOSIS — R05 Cough: Secondary | ICD-10-CM | POA: Insufficient documentation

## 2016-08-25 DIAGNOSIS — Z923 Personal history of irradiation: Secondary | ICD-10-CM | POA: Diagnosis not present

## 2016-08-25 DIAGNOSIS — C320 Malignant neoplasm of glottis: Secondary | ICD-10-CM

## 2016-08-25 NOTE — Progress Notes (Signed)
Radiation Oncology Follow up Note  Name: Matthew David   Date:   08/25/2016 MRN:  [id number] DOB: 11/10/1921    This 83 y.o. male presents to the clinic today for over 1 year follow-up for. Squamous cell carcinoma the larynx.  REFERRING PROVIDER: Adin Hector, MD  HPI: Patient is a 83 year old male now out over a year having completed external beam radiation therapy for squamous cell carcinoma of the larynx. Patient is seen today in routine follow-up he has been plagued by assistant nonproductive cough. Also some mild swallowing problems. Chest x-ray has been unremarkable and a swallowing study performed back in September was normal.. He does still take some Carafate rinses as well as Mucinex therapy.  COMPLICATIONS OF TREATMENT: As above  FOLLOW UP COMPLIANCE: keeps appointments   PHYSICAL EXAM:  BP 137/63   Pulse 69   Temp 98 F (36.7 C)   Wt 162 lb 11.2 oz (73.8 kg)   BMI 26.26 kg/m  Oral cavity is clear no oral mucosal lesions are identified indirect mirror examination shows only minor edema of the cords. No evidence of disease vallecula and base of tongue within normal limits. Neck is clear without evidence of subject gastric cervical or supraclavicular adenopathy.  RADIOLOGY RESULTS: Plain films and swallowing study reviewed  PLAN: Present time of ordered a CT scan of his chest with contrast to rule out any evidence of organic disease in his chest which may be causing his persistent cough. 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 Not sure of his swallowing difficulties although we will better visualize his esophagus on his CT scan. He continues otherwise with no evidence of disease. I've set up a follow-up appointment shortly after his CT scan to do is a discussed those results. He continues on with use of Mucinex as well as Carafate.  I would like to take this opportunity to thank you for allowing me to participate in the care of your  patient.[name]., MD

## 2016-08-30 ENCOUNTER — Ambulatory Visit
Admission: RE | Admit: 2016-08-30 | Discharge: 2016-08-30 | Disposition: A | Discharge status: Home / Self Care | Payer: Medicare Other | Source: Ambulatory Visit | Attending: Radiation Oncology | Admitting: Radiation Oncology

## 2016-08-30 DIAGNOSIS — R911 Solitary pulmonary nodule: Secondary | ICD-10-CM | POA: Diagnosis not present

## 2016-08-30 DIAGNOSIS — I700 Atherosclerosis of aorta: Secondary | ICD-10-CM | POA: Insufficient documentation

## 2016-08-30 DIAGNOSIS — C320 Malignant neoplasm of glottis: Secondary | ICD-10-CM | POA: Diagnosis not present

## 2016-08-30 DIAGNOSIS — I2510 Atherosclerotic heart disease of native coronary artery without angina pectoris: Secondary | ICD-10-CM | POA: Insufficient documentation

## 2016-08-30 LAB — POCT I-STAT CREATININE: CREATININE: 1 mg/dL (ref 0.61–1.24)

## 2016-08-30 MED ORDER — IOPAMIDOL (ISOVUE-300) INJECTION 61%
75.0000 mL | Freq: Once | INTRAVENOUS | Status: AC | PRN
  Administered 2016-08-30: 75 mL via INTRAVENOUS

## 2016-09-01 ENCOUNTER — Encounter: Payer: Self-pay | Admitting: Radiation Oncology

## 2016-09-01 ENCOUNTER — Other Ambulatory Visit: Payer: Self-pay | Admitting: *Deleted

## 2016-09-01 ENCOUNTER — Ambulatory Visit
Admission: RE | Admit: 2016-09-01 | Discharge: 2016-09-01 | Disposition: A | Discharge status: Home / Self Care | Payer: Medicare Other | Source: Ambulatory Visit | Attending: Radiation Oncology | Admitting: Radiation Oncology

## 2016-09-01 VITALS — BP 127/71 | HR 60 | Temp 98.0°F | Wt 161.7 lb

## 2016-09-01 DIAGNOSIS — Z923 Personal history of irradiation: Secondary | ICD-10-CM | POA: Insufficient documentation

## 2016-09-01 DIAGNOSIS — C329 Malignant neoplasm of larynx, unspecified: Secondary | ICD-10-CM | POA: Insufficient documentation

## 2016-09-01 DIAGNOSIS — R1310 Dysphagia, unspecified: Secondary | ICD-10-CM | POA: Diagnosis not present

## 2016-09-01 DIAGNOSIS — R918 Other nonspecific abnormal finding of lung field: Secondary | ICD-10-CM | POA: Diagnosis not present

## 2016-09-01 DIAGNOSIS — R1319 Other dysphagia: Secondary | ICD-10-CM

## 2016-09-01 DIAGNOSIS — C320 Malignant neoplasm of glottis: Secondary | ICD-10-CM

## 2016-09-01 NOTE — Progress Notes (Signed)
Radiation Oncology Follow up Note  Name: Matthew David   Date:   09/01/2016 MRN:  [id number] DOB: 01/27/1922    This 83 y.o. male presents to the clinic today for Follow-up for CT scan of the chest in patient with some dysphasia 1 year out status post radiation therapy for squamous cell carcinoma the larynx. [name]  REFERRING PROVIDER: Adin Hector, MD  HPI:  Patient is a 83 year old male now out 1 year having completed radiation therapy to his larynx for squamous cell carcinoma. He was complaining of a slight nonproductive cough and mild swallowing problems he did have a swallowing study which was normal. I ordered a CT scan of his chest showing no evidence of disease did have an incidental less than 4 mm pulmonary nodule. In my review the CT scan there may be some wall thickening of the cervical esophagus. Patient retains his weight is otherwise asymptomatic.  COMPLICATIONS OF TREATMENT: present  FOLLOW UP COMPLIANCE: keeps appointments   PHYSICAL EXAM:  BP 127/71   Pulse 60   Temp 98 F (36.7 C)   Wt 161 lb 11.3 oz (73.4 kg)   BMI 26.10 kg/m  Well-developed well-nourished patient in NAD. HEENT reveals PERLA, EOMI, discs not visualized.  Oral cavity is clear. No oral mucosal lesions are identified. Neck is clear without evidence of cervical or supraclavicular adenopathy. Lungs are clear to A&P. Cardiac examination is essentially unremarkable with regular rate and rhythm without murmur rub or thrill. Abdomen is benign with no organomegaly or masses noted. Motor sensory and DTR levels are equal and symmetric in the upper and lower extremities. Cranial nerves II through XII are grossly intact. Proprioception is intact. No peripheral adenopathy or edema is identified. No motor or sensory levels are noted. Crude visual fields are within normal range.  RADIOLOGY RESULTS: CT scan is reviewed compatible above-stated findings  PLAN: At this time I'm referring him to GI for possible cervical  esophageal dilatation. Patient may benefit from a complete upper endoscopy at this point to rule out any evidence of disease. [facility] clinicOtherwise he is doing well see him back in 6 months for follow-up.  I would like to take this opportunity to thank you for allowing me to participate in the care of your patient.[name]., MD

## 2016-09-14 ENCOUNTER — Other Ambulatory Visit: Payer: Self-pay | Admitting: Internal Medicine

## 2016-09-14 DIAGNOSIS — R2242 Localized swelling, mass and lump, left lower limb: Secondary | ICD-10-CM

## 2016-09-16 ENCOUNTER — Ambulatory Visit (INDEPENDENT_AMBULATORY_CARE_PROVIDER_SITE_OTHER): Payer: Medicare Other | Admitting: Vascular Surgery

## 2016-09-21 ENCOUNTER — Ambulatory Visit
Admission: RE | Admit: 2016-09-21 | Discharge: 2016-09-21 | Disposition: A | Discharge status: Home / Self Care | Payer: Medicare Other | Source: Ambulatory Visit | Attending: Internal Medicine | Admitting: Internal Medicine

## 2016-09-21 DIAGNOSIS — M1712 Unilateral primary osteoarthritis, left knee: Secondary | ICD-10-CM | POA: Diagnosis not present

## 2016-09-21 DIAGNOSIS — R2242 Localized swelling, mass and lump, left lower limb: Secondary | ICD-10-CM

## 2016-09-28 IMAGING — CR DG CHEST 2V
2 series · 2 of 2 positions shown · non-contrast
Comparison: 02/27/2014

CLINICAL DATA: Intermittent left-sided chest pain 4 days worsening
today.

EXAM:
CHEST  2 VIEW

[chest pa]
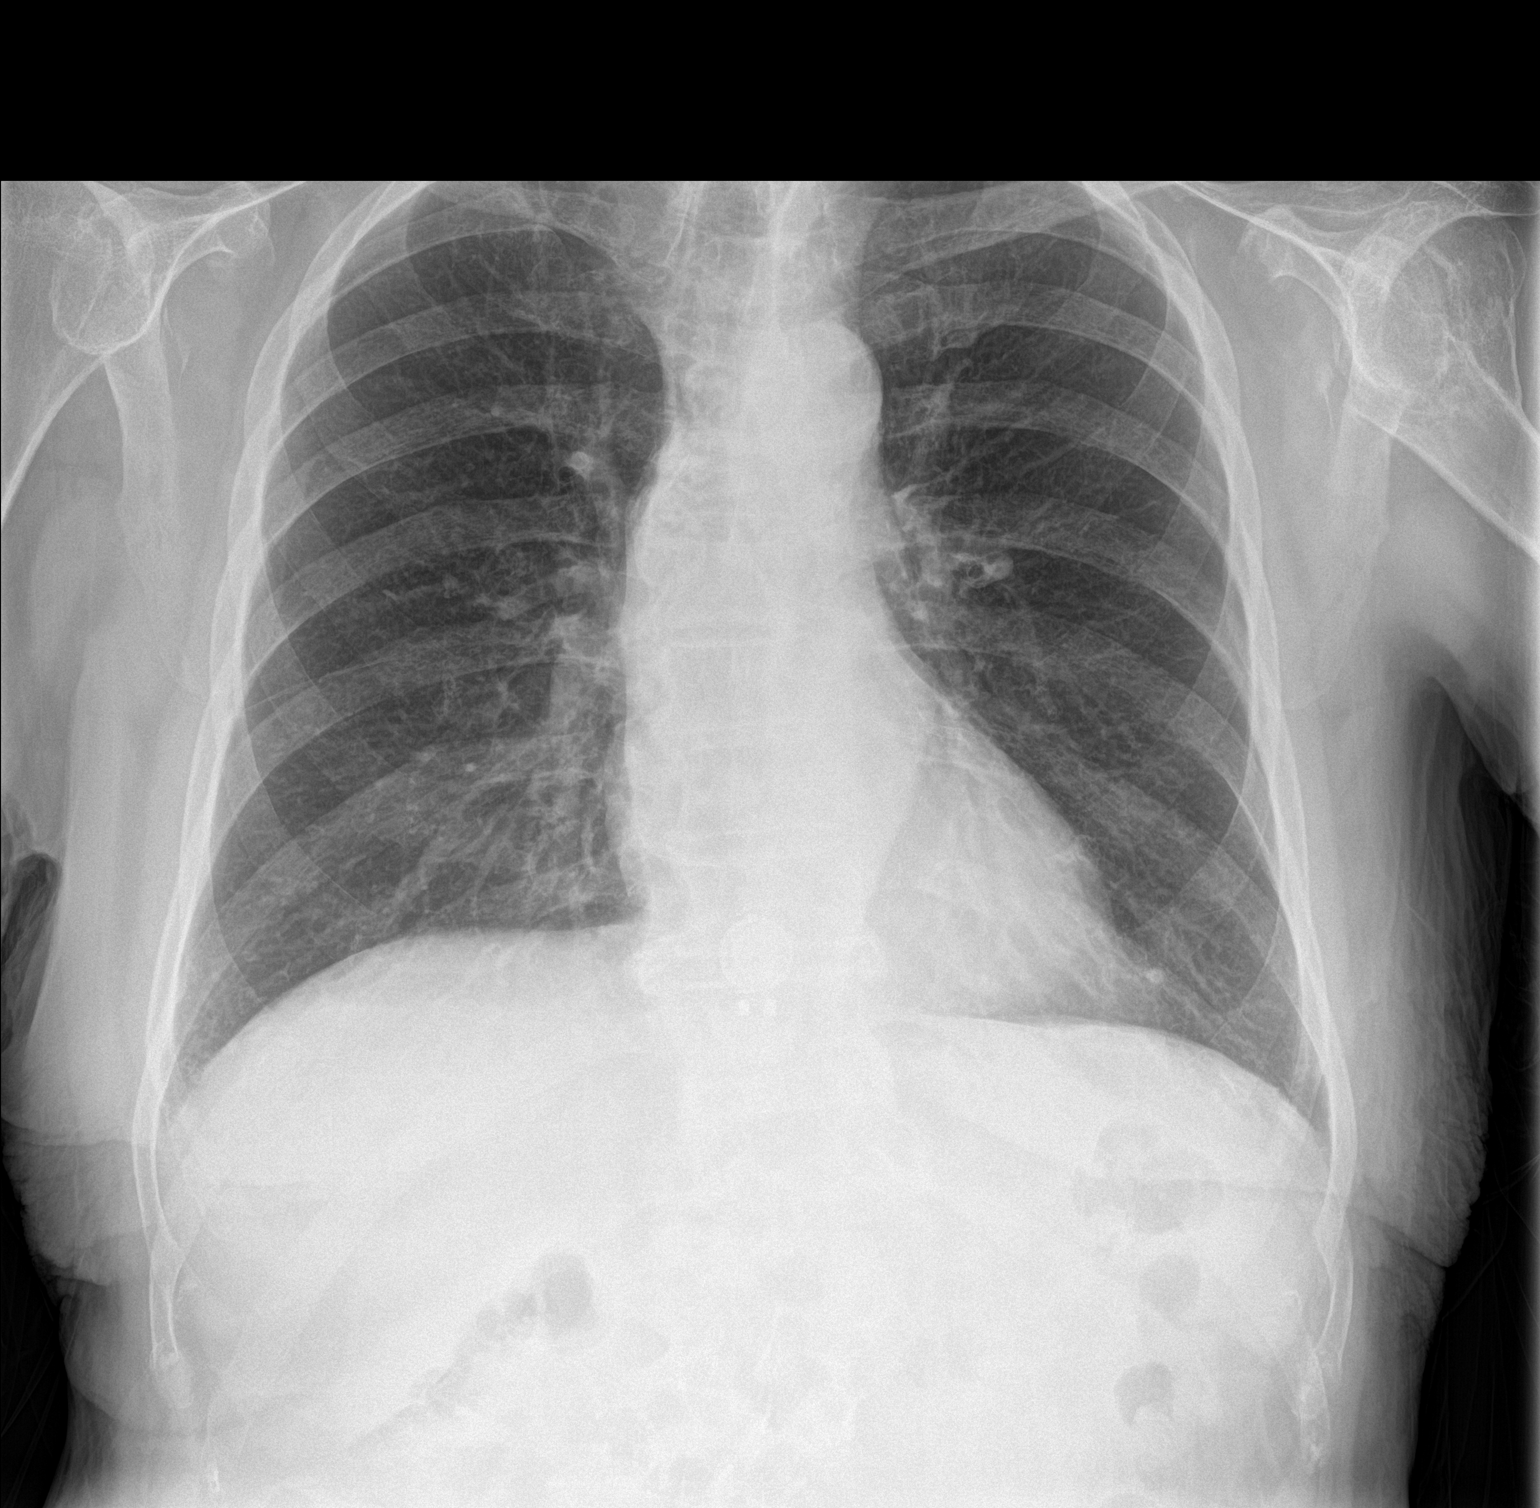

[chest lat]
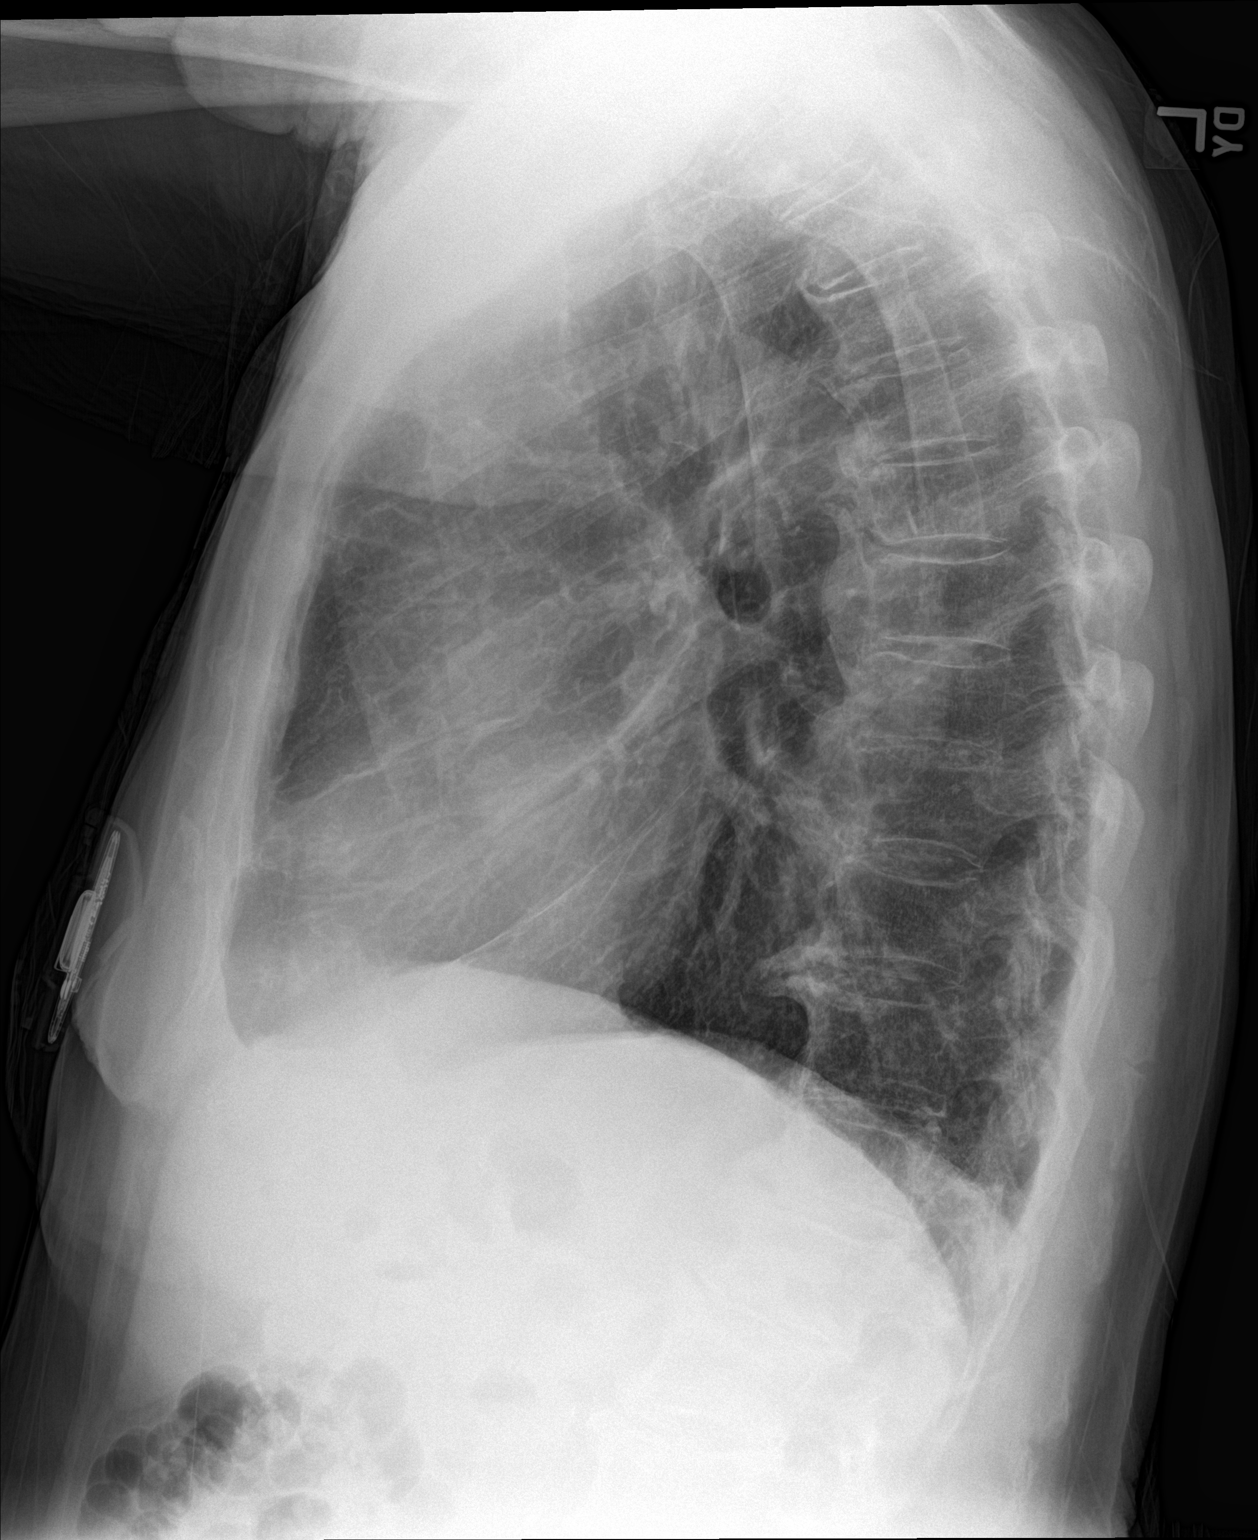

[2 of 2 positions shown; findings below may reference images not displayed]

FINDINGS: Lungs are adequately inflated without consolidation or effusion.
Cardiomediastinal silhouette is within normal. There are mild
degenerate changes of the spine. Old left humeral head/neck
fracture.
IMPRESSION: No active cardiopulmonary disease.

## 2016-11-15 ENCOUNTER — Ambulatory Visit (INDEPENDENT_AMBULATORY_CARE_PROVIDER_SITE_OTHER): Payer: Medicare Other | Admitting: Vascular Surgery

## 2016-11-15 ENCOUNTER — Encounter (INDEPENDENT_AMBULATORY_CARE_PROVIDER_SITE_OTHER): Payer: Self-pay | Admitting: Vascular Surgery

## 2016-11-15 VITALS — BP 134/68 | HR 62 | Resp 15 | Ht 64.0 in | Wt 158.0 lb

## 2016-11-15 DIAGNOSIS — I10 Essential (primary) hypertension: Secondary | ICD-10-CM

## 2016-11-15 DIAGNOSIS — I82512 Chronic embolism and thrombosis of left femoral vein: Secondary | ICD-10-CM | POA: Diagnosis not present

## 2016-11-15 DIAGNOSIS — E785 Hyperlipidemia, unspecified: Secondary | ICD-10-CM | POA: Diagnosis not present

## 2016-11-15 DIAGNOSIS — M79605 Pain in left leg: Secondary | ICD-10-CM | POA: Diagnosis not present

## 2016-11-17 NOTE — Progress Notes (Signed)
MRN : [id number]  Matthew David is a 83 y.o. (29-Apr-1922) male who presents with chief complaint of  Chief Complaint  Patient presents with  . Re-evaluation    Follow up  .  History of Present Illness: Patient returns today in follow up of leg pain and previous DVT.  He is doing well today.  He has pain mostly in his left thigh that his PCP has told him was likely musculoskeletal.  He really does not have much swelling at this point.  He wears his stockings regularly.  Current Outpatient Prescriptions  Medication Sig Dispense Refill  . acetaminophen (TYLENOL) 650 MG CR tablet Take by mouth.    . Alum & Mag Hydroxide-Simeth (MAGIC MOUTHWASH) SOLN Take 10 mLs by mouth 4 (four) times daily as needed for mouth pain.    . Amino Acids-Protein Hydrolys (FEEDING SUPPLEMENT, PRO-STAT SUGAR FREE 64,) LIQD Take 30 mLs by mouth 3 (three) times daily with meals.    [name] apixaban (ELIQUIS) 5 MG TABS tablet Take 1 tablet (5 mg total) by mouth 2 (two) times daily. 60 tablet 0  . aspirin 81 MG tablet Take 81 mg by mouth daily after breakfast.    . azelastine (ASTELIN) 0.1 % nasal spray Place 1 spray into both nostrils 2 (two) times daily. Use in each nostril as directed    . benzonatate (TESSALON) 100 MG capsule Take by mouth.    . cetirizine (ZYRTEC) 10 MG tablet Take 10 mg by mouth daily.    [name] CRANBERRY CONCENTRATE PO Take 1 tablet by mouth 2 (two) times daily.    . fluticasone (FLONASE) 50 MCG/ACT nasal spray Place 2 sprays into both nostrils daily.    [name] HYDROcodone-acetaminophen (NORCO/VICODIN) 5-325 MG tablet Take 1 tablet by mouth every 6 (six) hours as needed for moderate pain.    [name] LORazepam (ATIVAN) 1 MG tablet Take 1 mg by mouth at bedtime.    . lovastatin (MEVACOR) 20 MG tablet Take 20 mg by mouth at bedtime.    [name] Lysine 1000 MG TABS Take 1 tablet by mouth every morning.    . metoprolol tartrate (LOPRESSOR) 25 MG tablet Take 12.5 mg by mouth 2 (two) times daily.    . mirtazapine (REMERON) 15  MG tablet Take 7.5 mg by mouth at bedtime as needed (0.5 tablet at bedtime for sleep).     . multivitamin-iron-minerals-folic acid (CENTRUM) chewable tablet Chew 1 tablet by mouth every morning.    [name] oxyCODONE-acetaminophen (PERCOCET) 7.5-325 MG tablet     . pantoprazole (PROTONIX) 40 MG tablet Take 40 mg by mouth 2 (two) times daily.    . Probiotic Product (ALIGN PO) Take 1 capsule by mouth every morning.    Clarnce Flock Palmetto-Phytosterols (PROSTATE SR PO) Take 1 tablet by mouth 2 (two) times daily.    . sucralfate (CARAFATE) 1 g tablet TAKE ONE TABLET 3 TIMES DAILY WITH MEALS 90 tablet 11  . tamsulosin (FLOMAX) 0.4 MG CAPS capsule Take 0.4 mg by mouth 2 (two) times daily.    . valsartan-hydrochlorothiazide (DIOVAN-HCT) 80-12.5 MG tablet      No current facility-administered medications for this visit.     Past Medical History:  Diagnosis Date  . Cancer [facility])    Larynx Cancer 03/2015, Rad tx's.  [name] DVT (deep venous thrombosis) ([place])   . Hx of appendectomy   . Hypertension   . Status post right knee replacement   . Stented coronary artery     Past Surgical History:  Procedure Laterality Date  . APPENDECTOMY  1944  . CATARACT EXTRACTION W/ INTRAOCULAR LENS  IMPLANT, BILATERAL Bilateral 2011   both eye done in the same year  . CORONARY ANGIOPLASTY WITH STENT PLACEMENT  2012  . DIRECT LARYNGOSCOPY N/A 04/08/2015   Procedure: DIRECT LARYNGOSCOPY;  Surgeon: Clyde Canterbury, MD;  Location: [facility] ORS;  Service: ENT;  Laterality: N/A;  . JOINT REPLACEMENT Right 2008   knee  . KNEE ARTHROSCOPY Right 2006  . SHOULDER SURGERY Left 2006  . TONSILLECTOMY  1952    Social History Social History  Substance Use Topics  . Smoking status: Former Smoker    Types: Cigars, Pipe    Quit date: 03/30/1984  . Smokeless tobacco: Never Used  . Alcohol use No     Family History No bleeding or clotting disorders  Allergies  Allergen Reactions  . Gabapentin Nausea Only     REVIEW OF SYSTEMS (Negative  unless checked)  Constitutional: [] Weight loss  [] Fever  [] Chills Cardiac: [] Chest pain   [] Chest pressure   [] Palpitations   [] Shortness of breath when laying flat   [] Shortness of breath at rest   [] Shortness of breath with exertion. Vascular:  [x] Pain in legs with walking   [x] Pain in legs at rest   [] Pain in legs when laying flat   [] Claudication   [] Pain in feet when walking  [] Pain in feet at rest  [] Pain in feet when laying flat   [x] History of DVT   [x] Phlebitis   [x] Swelling in legs   [] Varicose veins   [] Non-healing ulcers Pulmonary:   [] Uses home oxygen   [] Productive cough   [] Hemoptysis   [] Wheeze  [] COPD   [] Asthma Neurologic:  [] Dizziness  [] Blackouts   [] Seizures   [] History of stroke   [] History of TIA  [] Aphasia   [] Temporary blindness   [] Dysphagia   [] Weakness or numbness in arms   [] Weakness or numbness in legs Musculoskeletal:  [] Arthritis   [] Joint swelling   [] Joint pain   [] Low back pain Hematologic:  [] Easy bruising  [] Easy bleeding   [] Hypercoagulable state   [] Anemic   Gastrointestinal:  [] Blood in stool   [] Vomiting blood  [] Gastroesophageal reflux/heartburn   [] Abdominal pain Genitourinary:  [] Chronic kidney disease   [] Difficult urination  [] Frequent urination  [] Burning with urination   [] Hematuria Skin:  [] Rashes   [] Ulcers   [] Wounds Psychological:  [] History of anxiety   []  History of major depression.  Physical Examination  BP 134/68 (BP Location: Left Arm)   Pulse 62   Resp 15   Ht 5\' 4"  (1.626 m)   Wt 71.7 kg (158 lb)   BMI 27.12 kg/m  Gen:  WD/WN, NAD. Appears younger than stated age. Head: [place]/AT, No temporalis wasting. Ear/Nose/Throat: Hearing grossly intact, nares w/o erythema or drainage, trachea midline Eyes: Conjunctiva clear. Sclera non-icteric Neck: Supple.  No JVD.  Pulmonary:  Good air movement, no use of accessory muscles.  Cardiac: RRR, normal S1, S2 Vascular:  Vessel Right Left  Radial Palpable Palpable                                     Gastrointestinal: soft, non-tender/non-distended. No guarding/reflex.  Musculoskeletal: M/S 5/5 throughout.  No deformity or atrophy. Trace BLE edema. Walks with a cane Neurologic: Sensation grossly intact in extremities.  Symmetrical.  Speech is fluent.  Psychiatric: Judgment intact, Mood & affect appropriate for pt's clinical situation. Dermatologic:  No rashes or ulcers noted.  No cellulitis or open wounds. Lymph : No Cervical, Axillary, or Inguinal lymphadenopathy.      Labs Recent Results (from the past 2160 hour(s))  I-STAT creatinine     Status: None   Collection Time: 08/30/16  9:49 AM  Result Value Ref Range   Creatinine, Ser 1.00 0.61 - 1.24 mg/dL    Radiology No results found.    Assessment/Plan  HLD (hyperlipidemia) lipid control important in reducing the progression of atherosclerotic disease. Continue statin therapy   Left leg DVT (HCC) Mild post-phlebitic symptoms at this point. RTC PRN  HTN (hypertension) blood pressure control important in reducing the progression of atherosclerotic disease. On appropriate oral medications.   Pain in limb Etiology not entirely clear.  PCP suggested musculoskeletal issues.  Post-phlebitic issues may contribute.  Stable at this point.    Leotis Pain, MD  11/17/2016 5:30 PM    This note was created with Dragon medical transcription system.  Any errors from dictation are purely unintentional

## 2016-11-17 NOTE — Assessment & Plan Note (Signed)
Etiology not entirely clear.  PCP suggested musculoskeletal issues.  Post-phlebitic issues may contribute.  Stable at this point.

## 2016-11-17 NOTE — Assessment & Plan Note (Signed)
Mild post-phlebitic symptoms at this point. RTC PRN

## 2016-11-17 NOTE — Assessment & Plan Note (Signed)
blood pressure control important in reducing the progression of atherosclerotic disease. On appropriate oral medications.  

## 2016-11-17 NOTE — Assessment & Plan Note (Signed)
lipid control important in reducing the progression of atherosclerotic disease. Continue statin therapy  

## 2016-11-18 ENCOUNTER — Ambulatory Visit (INDEPENDENT_AMBULATORY_CARE_PROVIDER_SITE_OTHER): Payer: Medicare Other | Admitting: Vascular Surgery

## 2016-12-27 ENCOUNTER — Ambulatory Visit (INDEPENDENT_AMBULATORY_CARE_PROVIDER_SITE_OTHER): Payer: Medicare Other | Admitting: Gastroenterology

## 2016-12-27 ENCOUNTER — Encounter: Payer: Self-pay | Admitting: Gastroenterology

## 2016-12-27 VITALS — BP 124/71 | HR 79 | Temp 97.0°F | Ht 66.0 in | Wt 157.0 lb

## 2016-12-27 DIAGNOSIS — M7918 Myalgia, other site: Secondary | ICD-10-CM

## 2016-12-27 DIAGNOSIS — M791 Myalgia: Secondary | ICD-10-CM

## 2016-12-27 NOTE — Progress Notes (Signed)
Gastroenterology Consultation  Referring Provider:     Adin Hector, MD Primary Care Physician:  Adin Hector, MD Primary Gastroenterologist:  Dr. Allen Norris     Reason for Consultation:     Right-sided abdominal pain        HPI:   Matthew David is a 83 y.o. y/o male referred for consultation & management of Right-sided abdominal pain  by Dr. Caryl Comes, Wendelyn Breslow III, MD.  This patient comes in today due to right-sided abdominal pain.  The patient states the pain has been present for many years and is under his appendectomy scar.  The patient had been seen by gastroenterology in the past and states that Dr. Vira Agar told him that he had adhesions from his surgery.  The patient states his surgery was over 40 years ago.  The patient also reports that the pain is not intense pain but bothersome.  His wife is a patient of mine and he states that he was impressed with me so he wanted to ask my opinion about this pain.  He denies any unexplained weight loss fevers chills nausea or vomiting.  The patient does state that the pain hurts worse when he lifts up his wife's wheelchair.  The patient's wife has dementia.  There is no report of any change in bowel habits or unexplained weight loss.  Past Medical History:  Diagnosis Date  . 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  [name] DVT (deep venous thrombosis) ([place])   . Hx of appendectomy   . Hypertension   . Status post right knee replacement   . Stented coronary artery     Past Surgical History:  Procedure Laterality Date  . APPENDECTOMY  1944  . CATARACT EXTRACTION W/ INTRAOCULAR LENS  IMPLANT, BILATERAL Bilateral 2011   both eye done in the same year  . CORONARY ANGIOPLASTY WITH STENT PLACEMENT  2012  . DIRECT LARYNGOSCOPY N/A 04/08/2015   Procedure: DIRECT LARYNGOSCOPY;  Surgeon: Clyde Canterbury, MD;  Location: [facility] ORS;  Service: ENT;  Laterality: N/A;  . JOINT REPLACEMENT Right 2008   knee  . KNEE ARTHROSCOPY Right 2006  . SHOULDER  SURGERY Left 2006  . TONSILLECTOMY  1952    Prior to Admission medications   Medication Sig Start Date End Date Taking? Authorizing Provider  acetaminophen (TYLENOL) 650 MG CR tablet Take by mouth.   Yes [provider]  aspirin 81 MG tablet Take 81 mg by mouth daily after breakfast.   Yes [provider]  benzonatate (TESSALON) 100 MG capsule Take by mouth.   Yes [provider]  cetirizine (ZYRTEC) 10 MG tablet Take 10 mg by mouth daily.   Yes [provider]  CRANBERRY CONCENTRATE PO Take 1 tablet by mouth 2 (two) times daily.   Yes [provider]  fluticasone (FLONASE) 50 MCG/ACT nasal spray Place 2 sprays into both nostrils daily.   Yes [provider]  HYDROcodone-acetaminophen (NORCO/VICODIN) 5-325 MG tablet Take 1 tablet by mouth every 6 (six) hours as needed for moderate pain.   Yes [provider]  LORazepam (ATIVAN) 1 MG tablet Take 1 mg by mouth at bedtime.   Yes [provider]  lovastatin (MEVACOR) 20 MG tablet Take 20 mg by mouth at bedtime.   Yes [provider]  Lysine 1000 MG TABS Take 1 tablet by mouth every morning.   Yes [provider]  metoprolol tartrate (LOPRESSOR) 25 MG tablet Take 12.5  mg by mouth 2 (two) times daily.   Yes [provider]  mirtazapine (REMERON) 15 MG tablet Take 7.5 mg by mouth at bedtime as needed (0.5 tablet at bedtime for sleep).    Yes [provider]  multivitamin-iron-minerals-folic acid (CENTRUM) chewable tablet Chew 1 tablet by mouth every morning.   Yes [provider]  oxyCODONE-acetaminophen (PERCOCET) 7.5-325 MG tablet  01/29/16  Yes [provider]  pantoprazole (PROTONIX) 40 MG tablet Take 40 mg by mouth 2 (two) times daily.   Yes [provider]  Probiotic Product (ALIGN PO) Take 1 capsule by mouth every morning.   Yes [provider]  Saw Palmetto-Phytosterols (PROSTATE SR PO) Take 1 tablet by  mouth 2 (two) times daily.   Yes [provider]  sucralfate (CARAFATE) 1 g tablet TAKE ONE TABLET 3 TIMES DAILY WITH MEALS 04/11/16  Yes Chrystal, Eulas Post, MD  tamsulosin (FLOMAX) 0.4 MG CAPS capsule Take 0.4 mg by mouth 2 (two) times daily.   Yes [provider]  valsartan-hydrochlorothiazide (DIOVAN-HCT) 80-12.5 MG tablet  01/11/16  Yes [provider]  Alum & Mag Hydroxide-Simeth (MAGIC MOUTHWASH) SOLN Take 10 mLs by mouth 4 (four) times daily as needed for mouth pain.    [provider]  Amino Acids-Protein Hydrolys (FEEDING SUPPLEMENT, PRO-STAT SUGAR FREE 64,) LIQD Take 30 mLs by mouth 3 (three) times daily with meals.    [provider]  apixaban (ELIQUIS) 5 MG TABS tablet Take 1 tablet (5 mg total) by mouth 2 (two) times daily. Patient not taking: Reported on 12/27/2016 08/22/15   Demetrios Loll, MD  azelastine (ASTELIN) 0.1 % nasal spray Place 1 spray into both nostrils 2 (two) times daily. Use in each nostril as directed    [provider]    History reviewed. No pertinent family history.   Social History  Substance Use Topics  . Smoking status: Former Smoker    Types: Cigars, Pipe    Quit date: 03/30/1984  . Smokeless tobacco: Never Used  . Alcohol use No    Allergies as of 12/27/2016 - Review Complete 12/27/2016  Allergen Reaction Noted  . Gabapentin Nausea Only 04/20/2015    Review of Systems:    All systems reviewed and negative except where noted in HPI.   Physical Exam:  BP 124/71   Pulse 79   Temp 97 F (36.1 C) (Oral)   Ht 5\' 6"  (1.676 m)   Wt 157 lb (71.2 kg)   BMI 25.34 kg/m  No LMP for male patient. Psych:  Alert and cooperative. Normal mood and affect. General:   Alert,  Well-developed, well-nourished, pleasant and cooperative in NAD Head:  Normocephalic and atraumatic. Eyes:  Sclera clear, no icterus.   Conjunctiva pink. Ears:  Normal auditory acuity. Nose:  No deformity, discharge, or lesions. Mouth:  No  deformity or lesions,oropharynx pink & moist. Neck:  Supple; no masses or thyromegaly. Lungs:  Respirations even and unlabored.  Clear throughout to auscultation.   No wheezes, crackles, or rhonchi. No acute distress. Heart:  Regular rate and rhythm; no murmurs, clicks, rubs, or gallops. Abdomen:  Normal bowel sounds.  No bruits.  Soft,There is tenderness to one finger palpation at the site of his scar.  The pain was reproducible with flexion of the abdominal wall muscles by having the patient started to sit up. hepatosplenomegaly or hernias noted.  No guarding or rebound tenderness.  Negative Carnett sign.   Rectal:  Deferred.  Msk:  Symmetrical without gross  deformities.  Good, equal movement & strength bilaterally. Pulses:  Normal pulses noted. Extremities:  No clubbing or edema.  No cyanosis. Neurologic:  Alert and oriented x3;  grossly normal neurologically. Skin:  Intact without significant lesions or rashes.  No jaundice. Lymph Nodes:  No significant cervical adenopathy. Psych:  Alert and cooperative. Normal mood and affect.  Imaging Studies: No results found.  Assessment and Plan:   Matthew David is a 83 y.o. y/o male with a history of chronic right lower abdominal pain. The patient had been told in the past that it was from adhesions from his appendectomy over 40 years ago but he states that he did not believe that because his abdominal pain started many years later.  On my physical exam the pain is clearly musculoskeletal which is reproducible with one finger palpation while flexing the abdominal wall muscles.  This is consistent with the cutting of the abdominal wall muscles for his surgery being the week as test area and due to the pain coming when he tries to pick things up consistent with a muscular pain and not a intestinal pain.  The patient reports that he is satisfied with the sensor and agrees that it is likely muscular pain.    Lucilla Lame, MD. Marval Regal   Note: This  dictation was prepared with Dragon dictation along with smaller phrase technology. Any transcriptional errors that result from this process are unintentional.

## 2017-03-16 ENCOUNTER — Encounter: Payer: Self-pay | Admitting: Radiation Oncology

## 2017-03-16 ENCOUNTER — Ambulatory Visit
Admission: RE | Admit: 2017-03-16 | Discharge: 2017-03-16 | Disposition: A | Discharge status: Home / Self Care | Payer: Medicare Other | Source: Ambulatory Visit | Attending: Radiation Oncology | Admitting: Radiation Oncology

## 2017-03-16 VITALS — BP 108/64 | HR 68 | Temp 97.8°F | Wt 158.6 lb

## 2017-03-16 DIAGNOSIS — R4702 Dysphasia: Secondary | ICD-10-CM | POA: Insufficient documentation

## 2017-03-16 DIAGNOSIS — Z8521 Personal history of malignant neoplasm of larynx: Secondary | ICD-10-CM | POA: Diagnosis present

## 2017-03-16 DIAGNOSIS — Z87891 Personal history of nicotine dependence: Secondary | ICD-10-CM | POA: Insufficient documentation

## 2017-03-16 DIAGNOSIS — Z923 Personal history of irradiation: Secondary | ICD-10-CM | POA: Diagnosis not present

## 2017-03-16 DIAGNOSIS — C320 Malignant neoplasm of glottis: Secondary | ICD-10-CM

## 2017-03-16 NOTE — Progress Notes (Signed)
Radiation Oncology Follow up Note  Name: Matthew David   Date:   03/16/2017 MRN:  [id number] DOB: 1922/08/14    This 83 y.o. male presents to the clinic today for one half year follow-up for squamous cell carcinoma the larynx.  REFERRING PROVIDER: Adin Hector, MD  HPI: Patient is a 83 year old male now out a year and a half having completed radiation therapy to his larynx. He is seen today in routine follow-up is doing well voice quality is excellent. He specifically denies head and neck pain does have some slight dysphasia which is been constant over the last year.[name] He doesn't a small right lower lobe pulmonary nodule on review of his CT scan back in January 2018 no need for follow-up patient is low risk was recommended  COMPLICATIONS OF TREATMENT: none  FOLLOW UP COMPLIANCE: keeps appointments   PHYSICAL EXAM:  BP 108/64   Pulse 68   Temp 97.8 F (36.6 C)   Wt 158 lb 9.9 oz (72 kg)   BMI 25.60 kg/m  Oral cavity is clear no oral mucosal lesions identified. Indirect mirror examination shows cord approximately well no evidence of mass or nodularity. Vallecula and base of tongue within normal limits. No evidence of subject gastric cervical or supra clavicular adenopathy. Well-developed well-nourished patient in NAD. HEENT reveals PERLA, EOMI, discs not visualized.  Oral cavity is clear. No oral mucosal lesions are identified. Neck is clear without evidence of cervical or supraclavicular adenopathy. Lungs are clear to A&P. Cardiac examination is essentially unremarkable with regular rate and rhythm without murmur rub or thrill. Abdomen is benign with no organomegaly or masses noted. Motor sensory and DTR levels are equal and symmetric in the upper and lower extremities. Cranial nerves II through XII are grossly intact. Proprioception is intact. No peripheral adenopathy or edema is identified. No motor or sensory levels are noted. Crude visual fields are within normal range.  RADIOLOGY  RESULTS: CT scan from January is reviewed and compatible with the above-stated findings  PLAN: Present time patient continues to do well with no evidence of disease. I've asked to see him back in 1 year for follow-up. He continues close follow-up care with ENT. Patient is to call with any concerns.  I would like to take this opportunity to thank you for allowing me to participate in the care of your patient.[name]., MD

## 2017-04-05 DIAGNOSIS — Z96651 Presence of right artificial knee joint: Secondary | ICD-10-CM | POA: Insufficient documentation

## 2017-05-24 IMAGING — US US EXTREM LOW VENOUS*L*
1 series · 13 of 24 positions shown · non-contrast
Comparison: CT of the abdomen pelvis dated 01/30/2015

CLINICAL DATA: [AGE] male with pain and swelling in the left
lower extremity



[Series 1: us extrem low venous*left* · 0.08mm/px · 13 of 32 slices shown]
[im 1/32]
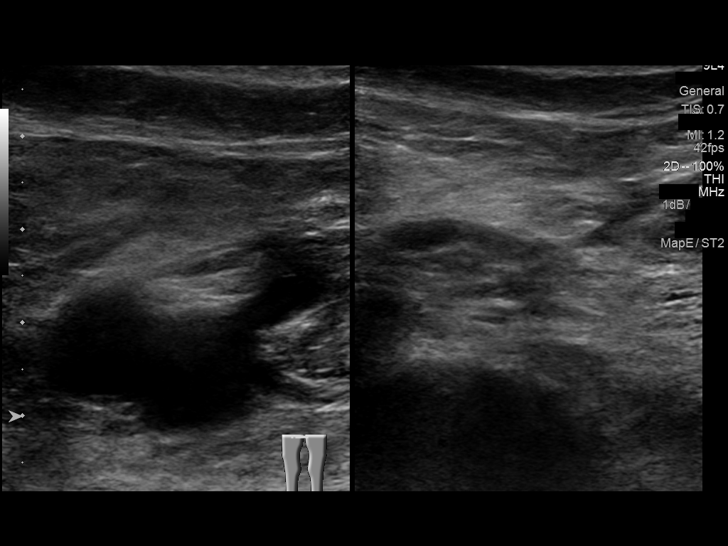
[im 3/32]
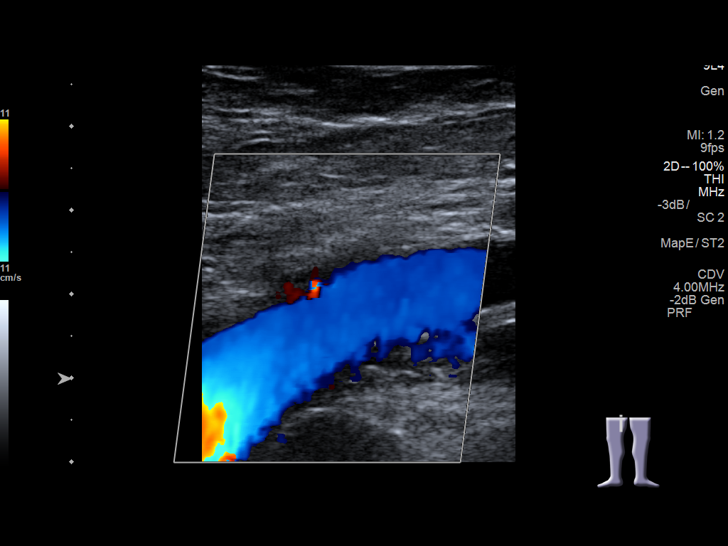
[im 6/32]
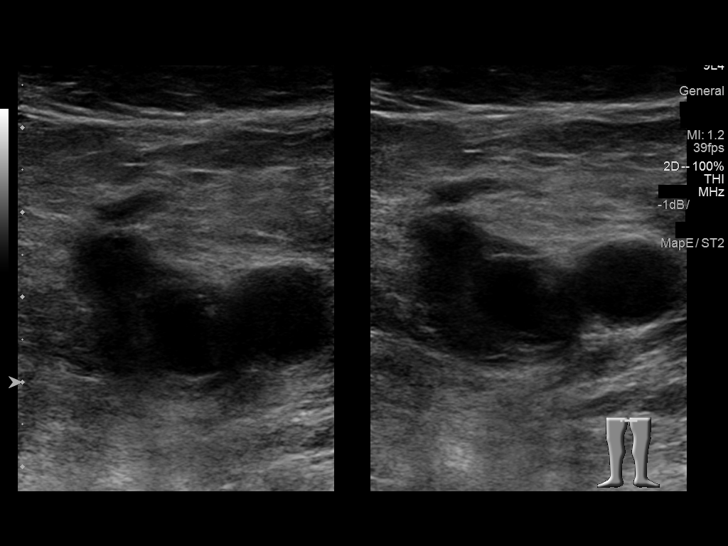
[im 9/32]
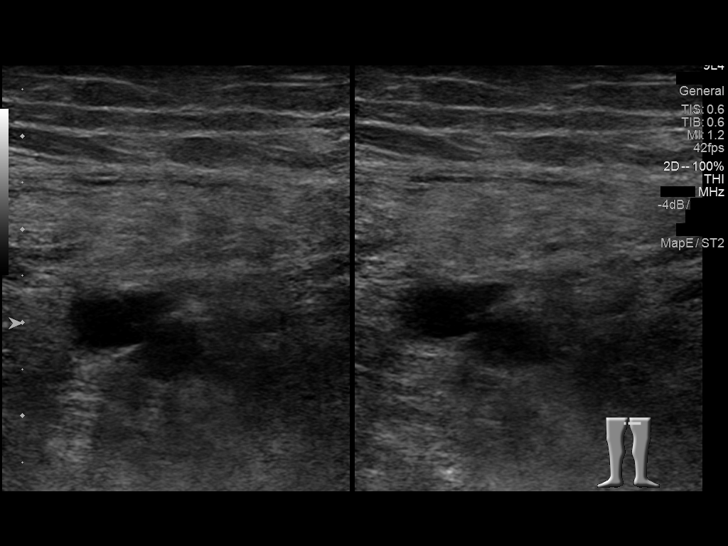
[im 11/32]
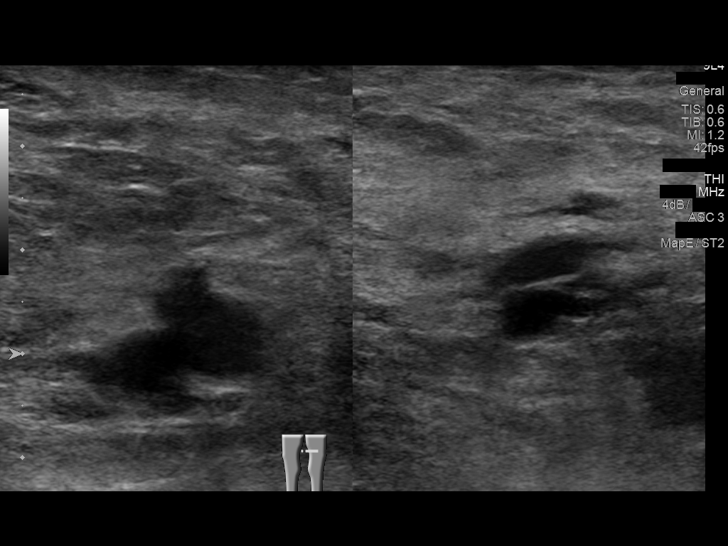
[im 14/32]
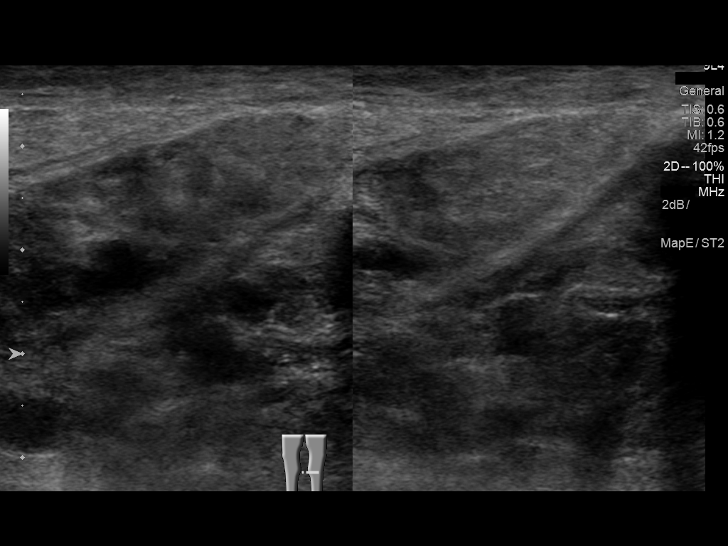
[im 17/32]
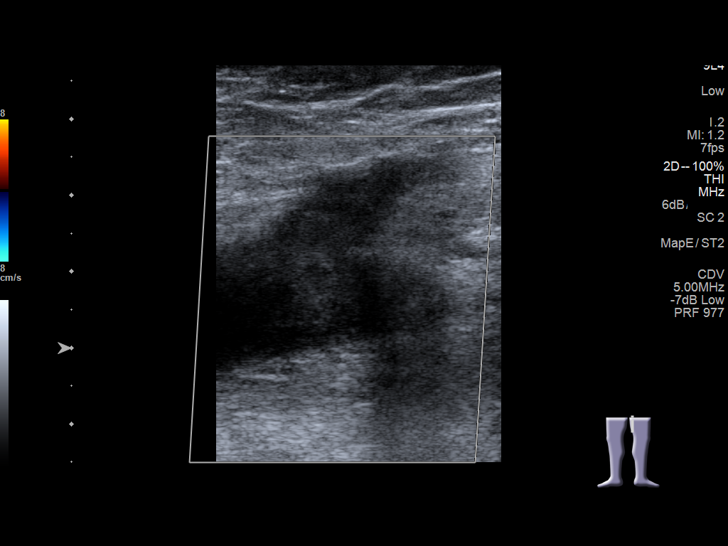
[im 18/32]
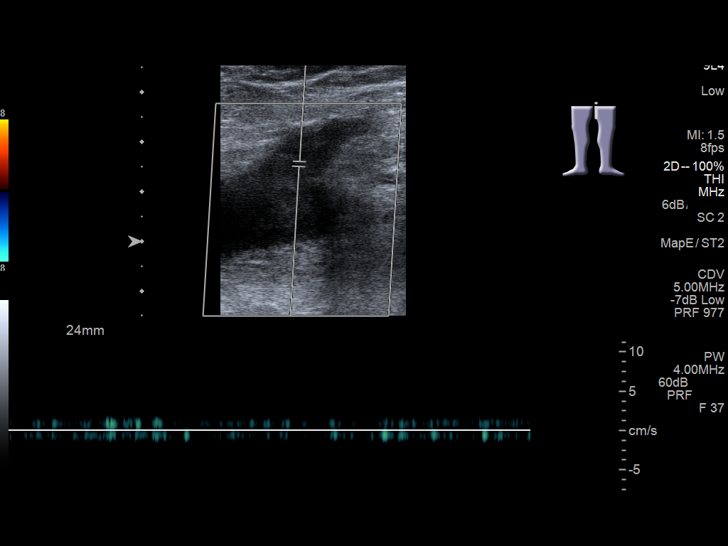
[im 21/32]
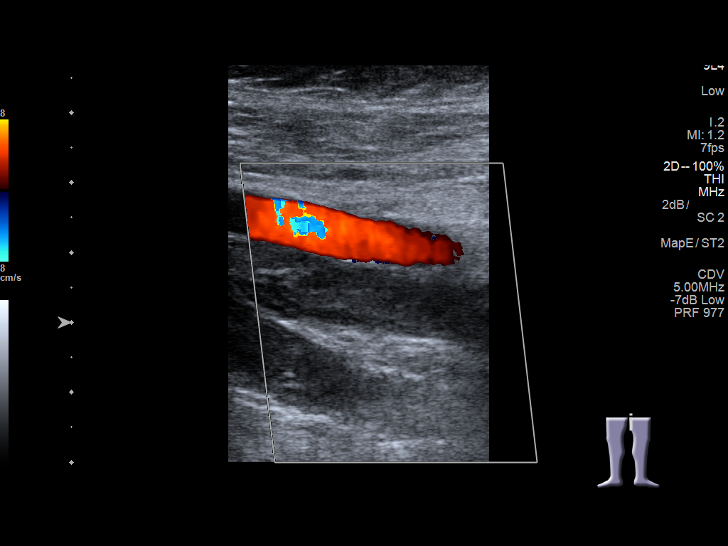
[im 23/32]
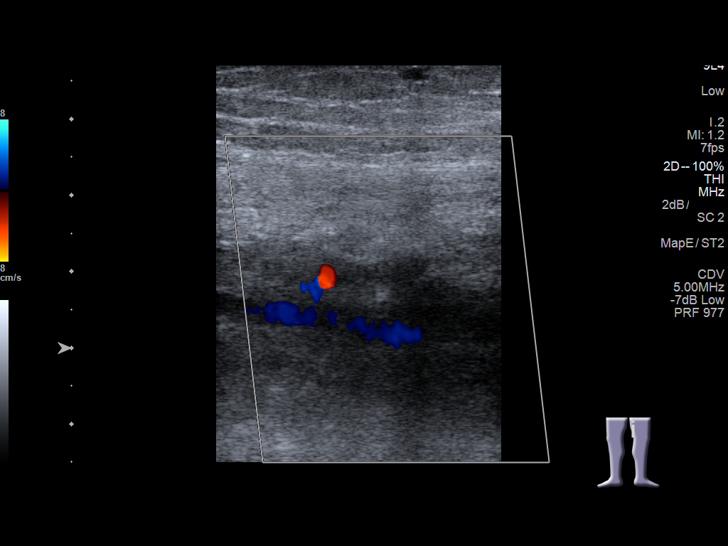
[im 26/32]
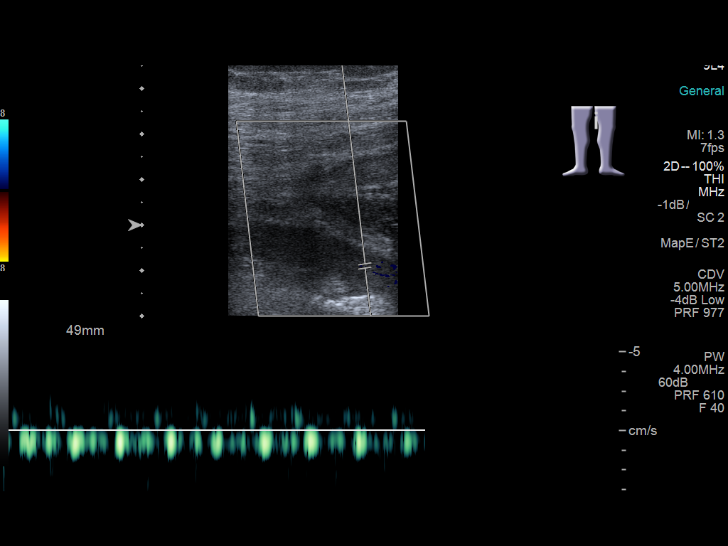
[im 29/32]
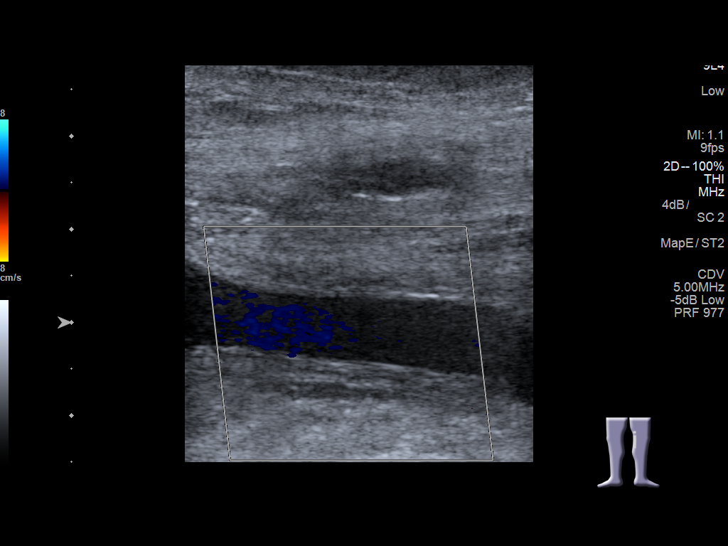
[im 32/32]
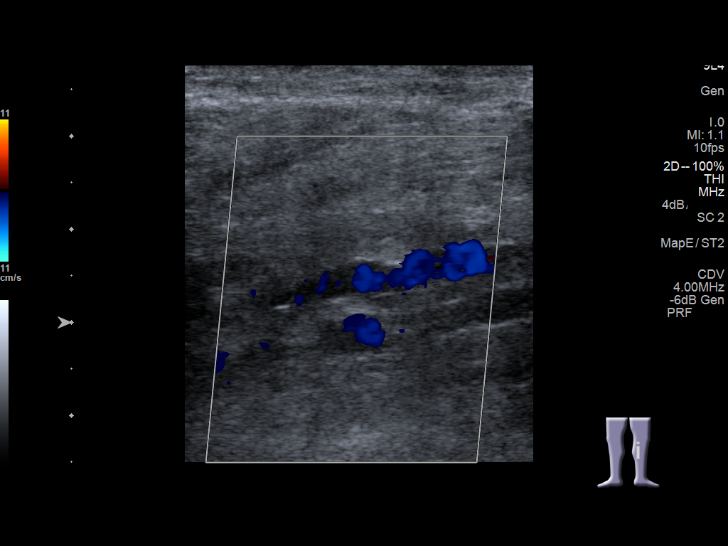

[13 of 24 positions shown; findings below may reference images not displayed]

FINDINGS: Contralateral Common Femoral Vein: Respiratory phasicity is normal
and symmetric with the symptomatic side. No evidence of thrombus.
Normal compressibility.

Common Femoral Vein: There is occlusive thrombus in the common
femoral vein.

Saphenofemoral Junction: Occlusive thrombus.

Profunda Femoral Vein: Occlusive thrombus in the deep femoral vein.

Femoral Vein: Occlusive thrombus in the proximal portion of the
femoral vein. Nonocclusive thrombus in the mid and distal femoral
vein.

Popliteal Vein: Nonocclusive thrombus in the popliteal vein.

Calf Veins: Nonocclusive thrombus in the visualized calf veins all

Superficial Great Saphenous Vein: Not visualized.
IMPRESSION: Extensive deep venous thrombosis in the left lower extremity with
proximal extension to the left common femoral vein.

Critical Value/emergent results were called by telephone at the time
of interpretation on 08/13/2015 at [DATE] to nurse Broomer who
verbally acknowledged these results.

## 2018-03-21 ENCOUNTER — Ambulatory Visit: Payer: Medicare Other | Admitting: Radiation Oncology

## 2018-03-26 ENCOUNTER — Other Ambulatory Visit: Payer: Self-pay

## 2018-03-26 ENCOUNTER — Ambulatory Visit
Admission: RE | Admit: 2018-03-26 | Discharge: 2018-03-26 | Disposition: A | Discharge status: Home / Self Care | Payer: Medicare Other | Source: Ambulatory Visit | Attending: Radiation Oncology | Admitting: Radiation Oncology

## 2018-03-26 VITALS — BP 133/63 | HR 52 | Temp 98.2°F | Resp 12 | Wt 168.1 lb

## 2018-03-26 DIAGNOSIS — Z8521 Personal history of malignant neoplasm of larynx: Secondary | ICD-10-CM | POA: Diagnosis not present

## 2018-03-26 DIAGNOSIS — R911 Solitary pulmonary nodule: Secondary | ICD-10-CM | POA: Insufficient documentation

## 2018-03-26 DIAGNOSIS — C320 Malignant neoplasm of glottis: Secondary | ICD-10-CM

## 2018-03-26 DIAGNOSIS — Z923 Personal history of irradiation: Secondary | ICD-10-CM | POA: Diagnosis not present

## 2018-03-26 NOTE — Progress Notes (Signed)
Radiation Oncology Follow up Note  Name: Matthew David   Date:   03/26/2018 MRN:  [id number] DOB: [birth year]    This 84 y.o. male presents to the clinic today for close to 3 year follow-up status post external beam radiation therapy to his larynx for stage I squamous cell carcinoma.  REFERRING PROVIDER: Adin Hector, MD  HPI: patient is a 84 year old male now out close to 3 years having completed external beam radiation therapy to his larynx for squamous cell carcinoma. He is seen today in routine follow-up is doing well specifically denies head and neck pain or dysphagia he states his voice quality is never returned exactly to normal. Continues to have a slight nonproductive cough. He is under regular checkups by ENT and they have shown no evidence of disease..patient a CT scan back in January showing a small right lower lobe pulmonary nodule which will be followed. No other disease was noted.  COMPLICATIONS OF TREATMENT: none  FOLLOW UP COMPLIANCE: keeps appointments   PHYSICAL EXAM:  BP 133/63 (BP Location: Left Wrist, Patient Position: Sitting, Cuff Size: Small)   Pulse (!) 52   Temp 98.2 F (36.8 C) (Tympanic)   Resp 12   Wt 168 lb 2 oz (76.3 kg)   BMI 27.14 kg/m  Oral cavity is clear no oral mucosal lesions are identified indirect mirror examination shows cord approximately well vallecula and base of tongue within normal limits. Neck is clear without evidence of subject gastric cervical or supra clavicular adenopathy.Well-developed well-nourished patient in NAD. HEENT reveals PERLA, EOMI, discs not visualized.  Oral cavity is clear. No oral mucosal lesions are identified. Neck is clear without evidence of cervical or supraclavicular adenopathy. Lungs are clear to A&P. Cardiac examination is essentially unremarkable with regular rate and rhythm without murmur rub or thrill. Abdomen is benign with no organomegaly or masses noted. Motor sensory and DTR levels are equal and symmetric  in the upper and lower extremities. Cranial nerves II through XII are grossly intact. Proprioception is intact. No peripheral adenopathy or edema is identified. No motor or sensory levels are noted. Crude visual fields are within normal range.  RADIOLOGY RESULTS: CT scans of chest abdomen and pelvis reviewed and compatible above-stated findings  PLAN: present time patient is doing well with no evidence of disease. Based on his advanced age he will continue close follow-up care with ENT. I'm going to discharge patient at this time since he is close to 3 years out. Patient knows to call at anytime. I have emphasized yearly follow-up of his lung with plain films or CT scans as indicated. Patient knows to call at anytime with any concerns.  I would like to take this opportunity to thank you for allowing me to participate in the care of your patient.Noreene Filbert, MD

## 2018-03-29 ENCOUNTER — Other Ambulatory Visit: Payer: Self-pay | Admitting: Otolaryngology

## 2018-03-29 ENCOUNTER — Ambulatory Visit
Admission: RE | Admit: 2018-03-29 | Discharge: 2018-03-29 | Disposition: A | Discharge status: Home / Self Care | Payer: Medicare Other | Source: Ambulatory Visit | Attending: Otolaryngology | Admitting: Otolaryngology

## 2018-03-29 DIAGNOSIS — Z8521 Personal history of malignant neoplasm of larynx: Secondary | ICD-10-CM | POA: Insufficient documentation

## 2018-12-01 ENCOUNTER — Other Ambulatory Visit: Payer: Self-pay

## 2018-12-01 ENCOUNTER — Observation Stay
Admission: EM | Admit: 2018-12-01 | Discharge: 2018-12-02 | Disposition: A | Discharge status: Home / Self Care | Payer: Medicare Other | Attending: Family Medicine | Admitting: Family Medicine

## 2018-12-01 ENCOUNTER — Encounter: Payer: Self-pay | Admitting: Emergency Medicine

## 2018-12-01 ENCOUNTER — Emergency Department: Payer: Medicare Other

## 2018-12-01 ENCOUNTER — Observation Stay: Payer: Medicare Other

## 2018-12-01 DIAGNOSIS — Z7982 Long term (current) use of aspirin: Secondary | ICD-10-CM | POA: Diagnosis not present

## 2018-12-01 DIAGNOSIS — R55 Syncope and collapse: Secondary | ICD-10-CM | POA: Diagnosis present

## 2018-12-01 DIAGNOSIS — I10 Essential (primary) hypertension: Secondary | ICD-10-CM | POA: Insufficient documentation

## 2018-12-01 DIAGNOSIS — Z955 Presence of coronary angioplasty implant and graft: Secondary | ICD-10-CM | POA: Diagnosis not present

## 2018-12-01 DIAGNOSIS — Z86718 Personal history of other venous thrombosis and embolism: Secondary | ICD-10-CM | POA: Insufficient documentation

## 2018-12-01 DIAGNOSIS — Z8249 Family history of ischemic heart disease and other diseases of the circulatory system: Secondary | ICD-10-CM | POA: Insufficient documentation

## 2018-12-01 DIAGNOSIS — Z96651 Presence of right artificial knee joint: Secondary | ICD-10-CM | POA: Diagnosis not present

## 2018-12-01 DIAGNOSIS — Z79899 Other long term (current) drug therapy: Secondary | ICD-10-CM | POA: Diagnosis not present

## 2018-12-01 DIAGNOSIS — Z87891 Personal history of nicotine dependence: Secondary | ICD-10-CM | POA: Diagnosis not present

## 2018-12-01 DIAGNOSIS — Z7901 Long term (current) use of anticoagulants: Secondary | ICD-10-CM | POA: Diagnosis not present

## 2018-12-01 DIAGNOSIS — Z8521 Personal history of malignant neoplasm of larynx: Secondary | ICD-10-CM | POA: Insufficient documentation

## 2018-12-01 DIAGNOSIS — R42 Dizziness and giddiness: Secondary | ICD-10-CM | POA: Diagnosis present

## 2018-12-01 DIAGNOSIS — Z888 Allergy status to other drugs, medicaments and biological substances status: Secondary | ICD-10-CM | POA: Insufficient documentation

## 2018-12-01 LAB — COMPREHENSIVE METABOLIC PANEL
ALT: 15 U/L (ref 0–44)
AST: 16 U/L (ref 15–41)
Albumin: 4.1 g/dL (ref 3.5–5.0)
Alkaline Phosphatase: 49 U/L (ref 38–126)
Anion gap: 7 (ref 5–15)
BUN: 30 mg/dL — ABNORMAL HIGH (ref 8–23)
CO2: 24 mmol/L (ref 22–32)
Calcium: 9.1 mg/dL (ref 8.9–10.3)
Chloride: 111 mmol/L (ref 98–111)
Creatinine, Ser: 0.9 mg/dL (ref 0.61–1.24)
GFR calc Af Amer: >60 mL/min (ref >60)
GFR calc non Af Amer: >60 mL/min (ref >60)
Glucose, Bld: 111 mg/dL — ABNORMAL HIGH (ref 70–99)
Potassium: 3.5 mmol/L (ref 3.5–5.1)
Sodium: 142 mmol/L (ref 135–145)
Total Bilirubin: 0.8 mg/dL (ref 0.3–1.2)
Total Protein: 6.8 g/dL (ref 6.5–8.1)

## 2018-12-01 LAB — CBC
HCT: 37.2 % — ABNORMAL LOW (ref 39.0–52.0)
Hemoglobin: 12.6 g/dL — ABNORMAL LOW (ref 13.0–17.0)
MCH: 30.7 pg (ref 26.0–34.0)
MCHC: 33.9 g/dL (ref 30.0–36.0)
MCV: 90.7 fL (ref 80.0–100.0)
Platelets: 110 10*3/uL — ABNORMAL LOW (ref 150–400)
RBC: 4.1 MIL/uL — ABNORMAL LOW (ref 4.22–5.81)
RDW: 13.4 % (ref 11.5–15.5)
WBC: 4.8 10*3/uL (ref 4.0–10.5)
nRBC: 0 % (ref 0.0–0.2)

## 2018-12-01 LAB — TROPONIN I: Troponin I: <0.03 ng/mL (ref <0.03)

## 2018-12-01 LAB — TSH: TSH: 1.177 u[IU]/mL (ref 0.350–4.500)

## 2018-12-01 MED ORDER — ASPIRIN EC 81 MG PO TBEC
81.0000 mg | DELAYED_RELEASE_TABLET | Freq: Every day | ORAL | Status: DC
  Administered 2018-12-02: 09:00:00 81 mg via ORAL
  Filled 2018-12-01: qty 1

## 2018-12-01 MED ORDER — ACETAMINOPHEN 650 MG RE SUPP: 650.0000 mg | Freq: Four times a day (QID) | RECTAL | Status: DC | PRN

## 2018-12-01 MED ORDER — LACTATED RINGERS IV SOLN
INTRAVENOUS | Status: AC
  Administered 2018-12-01 – 2018-12-02 (×2): via INTRAVENOUS

## 2018-12-01 MED ORDER — PANTOPRAZOLE SODIUM 40 MG PO TBEC
40.0000 mg | DELAYED_RELEASE_TABLET | Freq: Two times a day (BID) | ORAL | Status: DC
  Administered 2018-12-01 – 2018-12-02 (×2): 40 mg via ORAL
  Filled 2018-12-01 (×2): qty 1

## 2018-12-01 MED ORDER — TAMSULOSIN HCL 0.4 MG PO CAPS
0.4000 mg | ORAL_CAPSULE | Freq: Every day | ORAL | Status: DC
  Administered 2018-12-01: 19:00:00 0.4 mg via ORAL
  Filled 2018-12-01: qty 1

## 2018-12-01 MED ORDER — METOPROLOL TARTRATE 25 MG PO TABS
12.5000 mg | ORAL_TABLET | Freq: Two times a day (BID) | ORAL | Status: DC
  Administered 2018-12-01 – 2018-12-02 (×2): 12.5 mg via ORAL
  Filled 2018-12-01 (×2): qty 1

## 2018-12-01 MED ORDER — POLYETHYLENE GLYCOL 3350 17 G PO PACK: 17.0000 g | PACK | Freq: Every day | ORAL | Status: DC | PRN

## 2018-12-01 MED ORDER — ACETAMINOPHEN 325 MG PO TABS: 650.0000 mg | ORAL_TABLET | Freq: Four times a day (QID) | ORAL | Status: DC | PRN

## 2018-12-01 MED ORDER — PRAVASTATIN SODIUM 20 MG PO TABS: 20.0000 mg | ORAL_TABLET | Freq: Every day | ORAL | Status: DC

## 2018-12-01 MED ORDER — ONDANSETRON HCL 4 MG/2ML IJ SOLN: 4.0000 mg | Freq: Four times a day (QID) | INTRAMUSCULAR | Status: DC | PRN

## 2018-12-01 MED ORDER — ALBUTEROL SULFATE (2.5 MG/3ML) 0.083% IN NEBU: 2.5000 mg | INHALATION_SOLUTION | RESPIRATORY_TRACT | Status: DC | PRN

## 2018-12-01 MED ORDER — ONDANSETRON HCL 4 MG PO TABS: 4.0000 mg | ORAL_TABLET | Freq: Four times a day (QID) | ORAL | Status: DC | PRN

## 2018-12-01 MED ORDER — BENZONATATE 100 MG PO CAPS: 100.0000 mg | ORAL_CAPSULE | Freq: Three times a day (TID) | ORAL | Status: DC | PRN

## 2018-12-01 MED ORDER — SODIUM CHLORIDE 0.9 % IV SOLN
1000.0000 mL | Freq: Once | INTRAVENOUS | Status: AC
  Administered 2018-12-01: 1000 mL via INTRAVENOUS

## 2018-12-01 MED ORDER — GUAIFENESIN ER 600 MG PO TB12
1200.0000 mg | ORAL_TABLET | Freq: Two times a day (BID) | ORAL | Status: DC
  Administered 2018-12-01 – 2018-12-02 (×2): 1200 mg via ORAL
  Filled 2018-12-01 (×2): qty 2

## 2018-12-01 MED ORDER — LORAZEPAM 1 MG PO TABS
1.0000 mg | ORAL_TABLET | Freq: Every day | ORAL | Status: DC
  Administered 2018-12-01: 1 mg via ORAL
  Filled 2018-12-01: qty 1

## 2018-12-01 MED ORDER — LORATADINE 10 MG PO TABS
10.0000 mg | ORAL_TABLET | Freq: Every day | ORAL | Status: DC
  Administered 2018-12-01 – 2018-12-02 (×2): 10 mg via ORAL
  Filled 2018-12-01 (×2): qty 1

## 2018-12-01 MED ORDER — TRAMADOL HCL 50 MG PO TABS: 50.0000 mg | ORAL_TABLET | Freq: Four times a day (QID) | ORAL | Status: DC | PRN

## 2018-12-01 MED ORDER — ENOXAPARIN SODIUM 40 MG/0.4ML ~~LOC~~ SOLN: 40.0000 mg | SUBCUTANEOUS | Status: DC

## 2018-12-01 NOTE — ED Provider Notes (Signed)
[facility] Emergency Department Provider Note   ____________________________________________    I have reviewed the triage vital signs and the nursing notes.   HISTORY  Chief Complaint Near Syncope     HPI Matthew David is a 85 y.o. male who presents with complaints of dizziness, near syncopal episodes.  Patient reports since getting up to go to the bathroom at 4 AM this morning he has been having episodes of dizziness.  He reports initially when he got up to go the bathroom he felt like he was going to pass out.  Since then he has had multiple episodes where when he stands up he is felt lightheaded.  He denies chest pain shortness of breath.  No abdominal pain.  Normal stools.  Not on blood thinners.  No nausea or vomiting.  Currently feels quite well.   Past Medical History:  Diagnosis Date  . Cancer [facility])    Larynx Cancer 03/2015, Rad tx's.  [name] DVT (deep venous thrombosis) ([place])   . Hx of appendectomy   . Hypertension   . Status post right knee replacement   . Stented coronary artery     Patient Active Problem List   Diagnosis Date Noted  . Pain in limb 08/19/2016  . Laryngeal cancer ([place]) 06/14/2016  . Chest pain 05/07/2016  . ASCVD (arteriosclerotic cardiovascular disease) 05/07/2016  . HLD (hyperlipidemia) 05/07/2016  . HTN (hypertension) 05/07/2016  . Left leg DVT ([place]) 08/13/2015  . [place] 08/13/2015    Past Surgical History:  Procedure Laterality Date  . APPENDECTOMY  1944  . CATARACT EXTRACTION W/ INTRAOCULAR LENS  IMPLANT, BILATERAL Bilateral 2011   both eye done in the same year  . CORONARY ANGIOPLASTY WITH STENT PLACEMENT  2012  . DIRECT LARYNGOSCOPY N/A 04/08/2015   Procedure: DIRECT LARYNGOSCOPY;  Surgeon: Clyde Canterbury, MD;  Location: [facility] ORS;  Service: ENT;  Laterality: N/A;  . JOINT REPLACEMENT Right 2008   knee  . KNEE ARTHROSCOPY Right 2006  . SHOULDER SURGERY Left 2006  . TONSILLECTOMY  1952    Prior to  Admission medications   Medication Sig Start Date End Date Taking? Authorizing Provider  acetaminophen (TYLENOL) 650 MG CR tablet Take by mouth.    [provider]  Alum & Mag Hydroxide-Simeth (MAGIC MOUTHWASH) SOLN Take 10 mLs by mouth 4 (four) times daily as needed for mouth pain.    [provider]  Amino Acids-Protein Hydrolys (FEEDING SUPPLEMENT, PRO-STAT SUGAR FREE 64,) LIQD Take 30 mLs by mouth 3 (three) times daily with meals.    [provider]  apixaban (ELIQUIS) 5 MG TABS tablet Take 1 tablet (5 mg total) by mouth 2 (two) times daily. 08/22/15   Demetrios Loll, MD  aspirin 81 MG tablet Take 81 mg by mouth daily after breakfast.    [provider]  azelastine (ASTELIN) 0.1 % nasal spray Place 1 spray into both nostrils 2 (two) times daily. Use in each nostril as directed    [provider]  benzonatate (TESSALON) 100 MG capsule Take by mouth.    [provider]  cetirizine (ZYRTEC) 10 MG tablet Take 10 mg by mouth daily.    [provider]  CRANBERRY CONCENTRATE PO Take 1 tablet by mouth 2 (two) times daily.    [provider]  fluticasone (FLONASE) 50 MCG/ACT nasal spray Place 2 sprays into both nostrils daily.    [provider]  HYDROcodone-acetaminophen (NORCO/VICODIN) 5-325 MG tablet Take 1 tablet by mouth  every 6 (six) hours as needed for moderate pain.    [provider]  LORazepam (ATIVAN) 1 MG tablet Take 1 mg by mouth at bedtime.    [provider]  lovastatin (MEVACOR) 20 MG tablet Take 20 mg by mouth at bedtime.    [provider]  Lysine 1000 MG TABS Take 1 tablet by mouth every morning.    [provider]  metoprolol tartrate (LOPRESSOR) 25 MG tablet Take 12.5 mg by mouth 2 (two) times daily.    [provider]  mirtazapine (REMERON) 15 MG tablet Take 7.5 mg by mouth at bedtime as needed (0.5 tablet at bedtime for sleep).     [provider]   multivitamin-iron-minerals-folic acid (CENTRUM) chewable tablet Chew 1 tablet by mouth every morning.    [provider]  oxyCODONE-acetaminophen (PERCOCET) 7.5-325 MG tablet  01/29/16   [provider]  pantoprazole (PROTONIX) 40 MG tablet Take 40 mg by mouth 2 (two) times daily.    [provider]  Probiotic Product (ALIGN PO) Take 1 capsule by mouth every morning.    [provider]  Saw Palmetto-Phytosterols (PROSTATE SR PO) Take 1 tablet by mouth 2 (two) times daily.    [provider]  sucralfate (CARAFATE) 1 g tablet TAKE ONE TABLET 3 TIMES DAILY WITH MEALS 04/11/16   Chrystal, Eulas Post, MD  tamsulosin (FLOMAX) 0.4 MG CAPS capsule Take 0.4 mg by mouth 2 (two) times daily.    [provider]  valsartan-hydrochlorothiazide (DIOVAN-HCT) 80-12.5 MG tablet  01/11/16   [provider]     Allergies Gabapentin  History reviewed. No pertinent family history.  Social History Social History   Tobacco Use  . Smoking status: Former Smoker    Types: Cigars, Pipe    Last attempt to quit: 03/30/1984    Years since quitting: 34.6  . Smokeless tobacco: Never Used  Substance Use Topics  . Alcohol use: No  . Drug use: No    Review of Systems  Constitutional: No fever/chills Eyes: No visual changes.  ENT: No sore throat. Cardiovascular: Denies chest pain. Respiratory: Denies shortness of breath. Gastrointestinal: No abdominal pain.   Genitourinary: Negative for dysuria. Musculoskeletal: Negative for back pain. Skin: Negative for rash. Neurological: Negative for headaches or weakness   ____________________________________________   PHYSICAL EXAM:  VITAL SIGNS: ED Triage Vitals  Enc Vitals Group     BP 12/01/18 1159 139/75     Pulse Rate 12/01/18 1159 79     Resp 12/01/18 1159 12     Temp 12/01/18 1159 97.7 F (36.5 C)     Temp Source 12/01/18 1159 Oral     SpO2 12/01/18 1159 99 %     Weight 12/01/18 1201 81.6 kg (180  lb)     Height 12/01/18 1201 1.753 m (5\' 9" )     Head Circumference --      Peak Flow --      Pain Score 12/01/18 1201 0     Pain Loc --      Pain Edu? --      Excl. in [place]? --     Constitutional: Alert and oriented.  Eyes: Conjunctivae are normal.   Nose: No congestion/rhinnorhea. Mouth/Throat: Mucous membranes are moist.    Cardiovascular: Normal rate, regular rhythm. Grossly normal heart sounds.  Good peripheral circulation. Respiratory: Normal respiratory effort.  No retractions. Lungs CTAB. Gastrointestinal: Soft and nontender. No distention.  No CVA  Musculoskeletal: No lower extremity tenderness nor edema.  Warm and well perfused Neurologic:  Normal speech and language. No gross focal neurologic deficits are appreciated.  Skin:  Skin is warm, dry and intact. No rash noted. Psychiatric: Mood and affect are normal. Speech and behavior are normal.  ____________________________________________   LABS (all labs ordered are listed, but only abnormal results are displayed)  Labs Reviewed  CBC - Abnormal; Notable for the following components:      Result Value   RBC 4.10 (*)    Hemoglobin 12.6 (*)    HCT 37.2 (*)    Platelets 110 (*)    All other components within normal limits  COMPREHENSIVE METABOLIC PANEL - Abnormal; Notable for the following components:   Glucose, Bld 111 (*)    BUN 30 (*)    All other components within normal limits  TROPONIN I  CORTISOL  TSH   ____________________________________________  EKG  ED ECG REPORT I, Lavonia Drafts, the attending physician, personally viewed and interpreted this ECG.  Date: 12/01/2018  Rhythm: normal sinus rhythm QRS Axis: normal Intervals: normal ST/T Wave abnormalities: normal Narrative Interpretation: no evidence of acute ischemia  ____________________________________________  RADIOLOGY  Chest x-ray unremarkable ____________________________________________   PROCEDURES  Procedure(s) performed: No   Procedures   Critical Care performed: No ____________________________________________   INITIAL IMPRESSION / ASSESSMENT AND PLAN / ED COURSE  Pertinent labs & imaging results that were available during my care of the patient were reviewed by me and considered in my medical decision making (see chart for details).  Patient presents with near syncopal episodes as described above.  Differential includes vasovagal syncope, dehydration, anemia, arrhythmias.  Will check labs placed on cardiac monitor give IV fluids and reevaluate  Lab work is overall quite reassuring, hemoglobin is stable, troponin is normal.  Lab work consistent with mild dehydration, IV fluids infusing will check orthostatics.  Orthostatics are overall reassuring however when I asked the patient to sit up he became very lightheaded and felt like he might pass out, his heart rate did increase.  This is after fluids.  Given this I have talked to the hospitalist for admission    ____________________________________________   FINAL CLINICAL IMPRESSION(S) / ED DIAGNOSES  Final diagnoses:  Near syncope  Dizziness        Note:  This document was prepared using Dragon voice recognition software and may include unintentional dictation errors.   Lavonia Drafts, MD 12/01/18 1517

## 2018-12-01 NOTE — Plan of Care (Signed)

## 2018-12-01 NOTE — ED Notes (Signed)
[place] and [place] [facility]

## 2018-12-01 NOTE — Progress Notes (Signed)
Advance care planning  Purpose of Encounter Dizziness  Parties in Attendance Patient  Patients Decisional capacity Alert and oriented.  Able to make medical decisions.  He tells me his documented healthcare power of attorney is his son.  He already has a living will in place.  Discussed regarding patient's dizziness, treatment plan, prognosis.  All questions answered.  CODE STATUS discussed and patient tells me that he would like to be intubated and have CPR if needed.  He tell me his living will mentions if 2 physicians agree that he would not benefit from further life support it can be weaned.  FULL CODE  Time spent- 17 minutes

## 2018-12-01 NOTE — ED Notes (Signed)
This RN called 1C to see if accepting RN ready for pt to be transported to floor. Per Jinny Blossom RN is ready. Pt will be transported upon return from CT.

## 2018-12-01 NOTE — H&P (Signed)
[facility] NAME: Matthew David    MR#:  [id number]  DATE OF BIRTH:  02/25/1922  DATE OF ADMISSION:  12/01/2018  PRIMARY CARE PHYSICIAN: Adin Hector, MD   REQUESTING/REFERRING PHYSICIAN: Dr. Corky Downs  CHIEF COMPLAINT:   Chief Complaint  Patient presents with  . Near Syncope    HISTORY OF PRESENT ILLNESS:  Matthew David  is a 85 y.o. male with a known history of hypertension, DVT, vocal cord squamous cell carcinoma, hyperlipidemia who at baseline ambulates with a cane and lives with his wife with dementia presents to the emergency room complaining of acute onset of dizziness on sitting and standing since 4 AM.  Patient woke up at 4 AM went to the bathroom and got extremely dizzy and almost passed out.  Since then every time he stands up he gets dizzy.  Here in the emergency room he was given 1 L normal saline bolus.  Blood pressure checked with laying sitting and standing which is stable.  He does get tachycardic on standing.  Patient renal function is normal.  Chest x-ray shows nothing acute.  Being admitted for dizziness. No recent change in medications  PAST MEDICAL HISTORY:   Past Medical History:  Diagnosis Date  . Cancer [facility])    Larynx Cancer 03/2015, Rad tx's.  [name] DVT (deep venous thrombosis) ([place])   . Hx of appendectomy   . Hypertension   . Status post right knee replacement   . Stented coronary artery     PAST SURGICAL HISTORY:   Past Surgical History:  Procedure Laterality Date  . APPENDECTOMY  1944  . CATARACT EXTRACTION W/ INTRAOCULAR LENS  IMPLANT, BILATERAL Bilateral 2011   both eye done in the same year  . CORONARY ANGIOPLASTY WITH STENT PLACEMENT  2012  . DIRECT LARYNGOSCOPY N/A 04/08/2015   Procedure: DIRECT LARYNGOSCOPY;  Surgeon: Clyde Canterbury, MD;  Location: [facility] ORS;  Service: ENT;  Laterality: N/A;  . JOINT REPLACEMENT Right 2008   knee  . KNEE ARTHROSCOPY Right 2006  . SHOULDER SURGERY Left 2006   . TONSILLECTOMY  1952    SOCIAL HISTORY:   Social History   Tobacco Use  . Smoking status: Former Smoker    Types: Cigars, Pipe    Last attempt to quit: 03/30/1984    Years since quitting: 34.6  . Smokeless tobacco: Never Used  Substance Use Topics  . Alcohol use: No    FAMILY HISTORY:   Father with CAD.  Mother with stroke DRUG ALLERGIES:   Allergies  Allergen Reactions  . Gabapentin Nausea Only    REVIEW OF SYSTEMS:   Review of Systems  Constitutional: Positive for malaise/fatigue. Negative for chills and fever.  HENT: Negative for sore throat.   Eyes: Negative for blurred vision, double vision and pain.  Respiratory: Negative for cough, hemoptysis, shortness of breath and wheezing.   Cardiovascular: Negative for chest pain, palpitations, orthopnea and leg swelling.  Gastrointestinal: Negative for abdominal pain, constipation, diarrhea, heartburn, nausea and vomiting.  Genitourinary: Negative for dysuria and hematuria.  Musculoskeletal: Negative for back pain and joint pain.  Skin: Negative for rash.  Neurological: Positive for dizziness. Negative for sensory change, speech change, focal weakness and headaches.  Endo/Heme/Allergies: Does not bruise/bleed easily.  Psychiatric/Behavioral: Negative for depression. The patient is not nervous/anxious.    MEDICATIONS AT HOME:   Prior to Admission medications   Medication Sig Start Date End Date Taking? Authorizing Provider  acetaminophen (TYLENOL)  650 MG CR tablet Take by mouth.    [provider]  Alum & Mag Hydroxide-Simeth (MAGIC MOUTHWASH) SOLN Take 10 mLs by mouth 4 (four) times daily as needed for mouth pain.    [provider]  Amino Acids-Protein Hydrolys (FEEDING SUPPLEMENT, PRO-STAT SUGAR FREE 64,) LIQD Take 30 mLs by mouth 3 (three) times daily with meals.    [provider]  apixaban (ELIQUIS) 5 MG TABS tablet Take 1 tablet (5 mg total) by mouth 2 (two) times daily. 08/22/15   Demetrios Loll, MD  aspirin 81 MG tablet Take 81 mg by mouth daily after breakfast.    [provider]  azelastine (ASTELIN) 0.1 % nasal spray Place 1 spray into both nostrils 2 (two) times daily. Use in each nostril as directed    [provider]  benzonatate (TESSALON) 100 MG capsule Take by mouth.    [provider]  cetirizine (ZYRTEC) 10 MG tablet Take 10 mg by mouth daily.    [provider]  CRANBERRY CONCENTRATE PO Take 1 tablet by mouth 2 (two) times daily.    [provider]  fluticasone (FLONASE) 50 MCG/ACT nasal spray Place 2 sprays into both nostrils daily.    [provider]  HYDROcodone-acetaminophen (NORCO/VICODIN) 5-325 MG tablet Take 1 tablet by mouth every 6 (six) hours as needed for moderate pain.    [provider]  LORazepam (ATIVAN) 1 MG tablet Take 1 mg by mouth at bedtime.    [provider]  lovastatin (MEVACOR) 20 MG tablet Take 20 mg by mouth at bedtime.    [provider]  Lysine 1000 MG TABS Take 1 tablet by mouth every morning.    [provider]  metoprolol tartrate (LOPRESSOR) 25 MG tablet Take 12.5 mg by mouth 2 (two) times daily.    [provider]  mirtazapine (REMERON) 15 MG tablet Take 7.5 mg by mouth at bedtime as needed (0.5 tablet at bedtime for sleep).     [provider]  multivitamin-iron-minerals-folic acid (CENTRUM) chewable tablet Chew 1 tablet by mouth every morning.    [provider]  oxyCODONE-acetaminophen (PERCOCET) 7.5-325 MG tablet  01/29/16   [provider]  pantoprazole (PROTONIX) 40 MG tablet Take 40 mg by mouth 2 (two) times daily.    [provider]  Probiotic Product (ALIGN PO) Take 1 capsule by mouth every morning.    [provider]  Saw [place]met[place]-Phytosterols (PROSTATE SR PO) Take 1 tablet by mouth 2 (two) times daily.    [provider]  sucralfate (CARAFATE) 1 g tablet TAKE ONE TABLET 3 TIMES  DAILY WITH MEALS 04/11/16   Chrystal, Eulas Post, MD  tamsulosin (FLOMAX) 0.4 MG CAPS capsule Take 0.4 mg by mouth 2 (two) times daily.    [provider]  valsartan-hydrochlorothiazide (DIOVAN-HCT) 80-12.5 MG tablet  01/11/16   [provider]     VITAL SIGNS:  Blood pressure (!) 156/65, pulse 76, temperature 97.7 F (36.5 C), temperature source Oral, resp. rate 14, height 5\' 9"  (1.753 m), weight 81.6 kg, SpO2 97 %.  PHYSICAL EXAMINATION:  Physical Exam  GENERAL:  85 y.o.-year-old patient lying in the bed with no acute distress.  EYES: Pupils equal, round, reactive to light and accommodation. No scleral icterus. Extraocular muscles intact.  HEENT: Head atraumatic, normocephalic. Oropharynx and nasopharynx clear. No oropharyngeal erythema, moist oral mucosa  NECK:  Supple, no jugular venous distention. No thyroid enlargement, no tenderness.  LUNGS: Normal breath sounds bilaterally,  no wheezing, rales, rhonchi. No use of accessory muscles of respiration.  CARDIOVASCULAR: S1, S2 normal. No murmurs, rubs, or gallops.  ABDOMEN: Soft, nontender, nondistended. Bowel sounds present. No organomegaly or mass.  EXTREMITIES: No pedal edema, cyanosis, or clubbing. + 2 pedal & radial pulses b/l.   NEUROLOGIC: Cranial nerves II through XII are intact. No focal Motor or sensory deficits appreciated b/l PSYCHIATRIC: The patient is alert and oriented x 3. Good affect.  SKIN: No obvious rash, lesion, or ulcer.   LABORATORY PANEL:   CBC Recent Labs  Lab 12/01/18 1210  WBC 4.8  HGB 12.6*  HCT 37.2*  PLT 110*   ------------------------------------------------------------------------------------------------------------------  Chemistries  Recent Labs  Lab 12/01/18 1210  NA 142  K 3.5  CL 111  CO2 24  GLUCOSE 111*  BUN 30*  CREATININE 0.90  CALCIUM 9.1  AST 16  ALT 15  ALKPHOS 49  BILITOT 0.8    ------------------------------------------------------------------------------------------------------------------  Cardiac Enzymes Recent Labs  Lab 12/01/18 1210  TROPONINI <0.03   ------------------------------------------------------------------------------------------------------------------  RADIOLOGY:  Dg Chest Port 1 View  Result Date: 12/01/2018 CLINICAL DATA:  Acute onset of dizziness and multiple near syncopal episodes since approximately 4:30 a.m. this morning. Personal history of laryngeal cancer in 2016. EXAM: PORTABLE CHEST 1 VIEW COMPARISON:  03/29/2018 and earlier, including CT chest 08/30/2016. FINDINGS: Cardiac silhouette upper normal in size for AP portable technique. Lungs clear. Bronchovascular markings normal. Pulmonary vascularity normal. No visible pleural effusions. No pneumothorax. Remote healed fracture involving the LEFT humeral head and neck. IMPRESSION: No acute cardiopulmonary disease. Electronically Signed   By: Evangeline Dakin M.D.   On: 12/01/2018 12:58     IMPRESSION AND PLAN:   *Orthostatic dizziness New symptoms since yesterday.  Patient's blood pressure is stable on sitting and standing blood pressure checks.  But he does get tachycardic.  He has received 1 L normal saline in the emergency room.  We will continue IV fluids.  Observation admission.  Will check echocardiogram.  With history of carotid endarterectomy I have also ordered a carotid ultrasound.  Will check cortisol and TSH levels. CT scan of the head to be done soon. Fall precautions. Pots would explain the orthostatic tachycardia but unlikely to have onset at his age.  *Hypertension.  Continue home medications.  *DVT prophylaxis with Lovenox  All the records are reviewed and case discussed with ED provider. Management plans discussed with the patient, family and they are in agreement.  CODE STATUS: FULL CODE  TOTAL TIME TAKING CARE OF THIS PATIENT: 35 minutes.   Neita Carp M.D on 12/01/2018 at 3:34 PM  Between 7am to 6pm - Pager - [phone]  After 6pm go to [email or web address] - password [email or web address] [facility]  [facility]  Office  [phone]  CC: Primary care physician; Adin Hector, MD  Note: This dictation was prepared with Dragon dictation along with smaller phrase technology. Any transcriptional errors that result from this process are unintentional.

## 2018-12-01 NOTE — ED Triage Notes (Signed)
Pt to ED by EMS with c/o of dizziness and multiple near syncope episodes since approx 4:30 AM.

## 2018-12-01 NOTE — ED Notes (Signed)
Pharmacy request pt's cell phone # [phone]

## 2018-12-01 NOTE — ED Notes (Signed)
ED TO INPATIENT HANDOFF REPORT  ED Nurse Name and Phone #:  Maudie Mercury [phone]  S Name/Age/Gender Matthew David 85 y.o. male Room/Bed: ED12A/ED12A  Code Status   Code Status: Full Code  Home/SNF/Other Home Patient oriented to: self, place, time and situation Is this baseline? Yes   Triage Complete: Triage complete  Chief Complaint Syncope   Triage Note Pt to ED by EMS with c/o of dizziness and multiple near syncope episodes since approx 4:30 AM.   Allergies Allergies  Allergen Reactions  . Gabapentin Nausea Only    Level of Care/Admitting Diagnosis ED Disposition    ED Disposition Condition [facility] Area: [place] [[id number]]  Level of Care: Med-Surg [16]  Diagnosis: Dizziness [604540]  Admitting Physician: Hillary Bow [[id number]]  Attending Physician: Hillary Bow [[id number]]  PT Class (Do Not Modify): Observation [104]  PT Acc Code (Do Not Modify): Observation [10022]       B Medical/Surgery History Past Medical History:  Diagnosis Date  . Cancer [facility])    Larynx Cancer 03/2015, Rad tx's.  [name] DVT (deep venous thrombosis) ([place])   . Hx of appendectomy   . Hypertension   . Status post right knee replacement   . Stented coronary artery    Past Surgical History:  Procedure Laterality Date  . APPENDECTOMY  1944  . CATARACT EXTRACTION W/ INTRAOCULAR LENS  IMPLANT, BILATERAL Bilateral 2011   both eye done in the same year  . CORONARY ANGIOPLASTY WITH STENT PLACEMENT  2012  . DIRECT LARYNGOSCOPY N/A 04/08/2015   Procedure: DIRECT LARYNGOSCOPY;  Surgeon: Clyde Canterbury, MD;  Location: [facility] ORS;  Service: ENT;  Laterality: N/A;  . JOINT REPLACEMENT Right 2008   knee  . KNEE ARTHROSCOPY Right 2006  . SHOULDER SURGERY Left 2006  . TONSILLECTOMY  1952     A IV Location/Drains/Wounds Patient Lines/Drains/Airways Status   Active Line/Drains/Airways    Name:   Placement date:   Placement time:   Site:   Days:   Peripheral IV  12/01/18 Right Forearm   12/01/18    1211    Forearm   less than 1          Intake/Output Last 24 hours No intake or output data in the 24 hours ending 12/01/18 1555  Labs/Imaging Results for orders placed or performed during the hospital encounter of 12/01/18 (from the past 48 hour(s))  CBC     Status: Abnormal   Collection Time: 12/01/18 12:10 PM  Result Value Ref Range   WBC 4.8 4.0 - 10.5 K/uL   RBC 4.10 (L) 4.22 - 5.81 MIL/uL   Hemoglobin 12.6 (L) 13.0 - 17.0 g/dL   HCT 37.2 (L) 39.0 - 52.0 %   MCV 90.7 80.0 - 100.0 fL   MCH 30.7 26.0 - 34.0 pg   MCHC 33.9 30.0 - 36.0 g/dL   RDW 13.4 11.5 - 15.5 %   Platelets 110 (L) 150 - 400 K/uL    Comment: Immature Platelet Fraction may be clinically indicated, consider ordering this additional test [id number]    nRBC 0.0 0.0 - 0.2 %    Comment: Performed at [facility], [address] [place], [place] [address]  Comprehensive metabolic panel     Status: Abnormal   Collection Time: 12/01/18 12:10 PM  Result Value Ref Range   Sodium 142 135 - 145 mmol/L   Potassium 3.5 3.5 - 5.1 mmol/L   Chloride 111 98 - 111 mmol/L  CO2 24 22 - 32 mmol/L   Glucose, Bld 111 (H) 70 - 99 mg/dL   BUN 30 (H) 8 - 23 mg/dL   Creatinine, Ser 0.90 0.61 - 1.24 mg/dL   Calcium 9.1 8.9 - 10.3 mg/dL   Total Protein 6.8 6.5 - 8.1 g/dL   Albumin 4.1 3.5 - 5.0 g/dL   AST 16 15 - 41 U/L   ALT 15 0 - 44 U/L   Alkaline Phosphatase 49 38 - 126 U/L   Total Bilirubin 0.8 0.3 - 1.2 mg/dL   GFR calc non Af Amer >60 >60 mL/min   GFR calc Af Amer >60 >60 mL/min   Anion gap 7 5 - 15    Comment: Performed at [facility], [address] [place], [place] [address]  Troponin I - ONCE - STAT     Status: None   Collection Time: 12/01/18 12:10 PM  Result Value Ref Range   Troponin I <0.03 <0.03 ng/mL    Comment: Performed at [facility], [address] [place], [place] [address]  TSH     Status: None   Collection Time: 12/01/18 12:10  PM  Result Value Ref Range   TSH 1.177 0.350 - 4.500 uIU/mL    Comment: Performed by a 3rd Generation assay with a functional sensitivity of <=0.01 uIU/mL. Performed at [facility], [address] [place], [place] [address]    Dg Chest [place] 1 View  Result Date: 12/01/2018 CLINICAL DATA:  Acute onset of dizziness and multiple near syncopal episodes since approximately 4:30 a.m. this morning. Personal history of laryngeal cancer in 2016. EXAM: PORTABLE CHEST 1 VIEW COMPARISON:  03/29/2018 and earlier, including CT chest 08/30/2016. FINDINGS: Cardiac silhouette upper normal in size for AP portable technique. Lungs clear. Bronchovascular markings normal. Pulmonary vascularity normal. No visible pleural effusions. No pneumothorax. Remote healed fracture involving the LEFT humeral head and neck. IMPRESSION: No acute cardiopulmonary disease. Electronically Signed   By: Evangeline Dakin M.D.   On: 12/01/2018 12:58    Pending Labs Unresulted Labs (From admission, onward)    Start     Ordered   12/08/18 0500  Creatinine, serum  (enoxaparin (LOVENOX)    CrCl >/= 30 ml/min)  Weekly,   STAT    Comments:  while on enoxaparin therapy    12/01/18 1531   12/02/18 2683  Basic metabolic panel  Tomorrow morning,   STAT     12/01/18 1531   12/01/18 1453  Cortisol  Add-on,   AD     12/01/18 1452          Vitals/Pain Today's Vitals   12/01/18 1200 12/01/18 1201 12/01/18 1500 12/01/18 1530  BP: (!) 156/65  (!) 115/94 (!) 118/93  Pulse: 76  86 87  Resp: 14  (!) 21 16  Temp:      TempSrc:      SpO2: 97%  99% 97%  Weight:  81.6 kg    Height:  5\' 9"  (1.753 m)    PainSc:  0-No pain      Isolation Precautions No active isolations  Medications Medications  enoxaparin (LOVENOX) injection 40 mg (has no administration in time range)  acetaminophen (TYLENOL) tablet 650 mg (has no administration in time range)    Or  acetaminophen (TYLENOL) suppository 650 mg (has no administration in time  range)  ondansetron (ZOFRAN) tablet 4 mg (has no administration in time range)    Or  ondansetron (ZOFRAN) injection 4 mg (has no  administration in time range)  polyethylene glycol (MIRALAX / GLYCOLAX) packet 17 g (has no administration in time range)  albuterol (PROVENTIL) (2.5 MG/3ML) 0.083% nebulizer solution 2.5 mg (has no administration in time range)  lactated ringers infusion (has no administration in time range)  0.9 %  sodium chloride infusion (1,000 mLs Intravenous New Bag/Given 12/01/18 1215)    Mobility walks with person assist Low fall risk   Focused Assessments    R Recommendations: See Admitting Provider Note  Report given to:   Additional Notes:

## 2018-12-02 ENCOUNTER — Observation Stay
Admit: 2018-12-02 | Discharge: 2018-12-02 | Disposition: A | Discharge status: Home / Self Care | Payer: Medicare Other | Attending: Internal Medicine | Admitting: Internal Medicine

## 2018-12-02 DIAGNOSIS — R42 Dizziness and giddiness: Secondary | ICD-10-CM | POA: Diagnosis not present

## 2018-12-02 LAB — ECHOCARDIOGRAM COMPLETE
Height: 69 in
Weight: 2800 oz

## 2018-12-02 LAB — MAGNESIUM: Magnesium: 1.9 mg/dL (ref 1.7–2.4)

## 2018-12-02 LAB — BASIC METABOLIC PANEL
Anion gap: 9 (ref 5–15)
BUN: 20 mg/dL (ref 8–23)
CO2: 23 mmol/L (ref 22–32)
Calcium: 8.6 mg/dL — ABNORMAL LOW (ref 8.9–10.3)
Chloride: 111 mmol/L (ref 98–111)
Creatinine, Ser: 0.84 mg/dL (ref 0.61–1.24)
GFR calc Af Amer: >60 mL/min (ref >60)
GFR calc non Af Amer: >60 mL/min (ref >60)
Glucose, Bld: 102 mg/dL — ABNORMAL HIGH (ref 70–99)
Potassium: 3.3 mmol/L — ABNORMAL LOW (ref 3.5–5.1)
Sodium: 143 mmol/L (ref 135–145)

## 2018-12-02 LAB — CORTISOL: Cortisol, Plasma: 9.7 ug/dL

## 2018-12-02 MED ORDER — POTASSIUM CHLORIDE 20 MEQ PO PACK
40.0000 meq | PACK | Freq: Once | ORAL | Status: AC
  Administered 2018-12-02: 09:00:00 40 meq via ORAL
  Filled 2018-12-02: qty 2

## 2018-12-02 NOTE — Progress Notes (Signed)
Patient is being discharged home this afternoon. Patient's daughter will [place] up patient [place] medical mall. IV removed. DC instructions given and patient acknowledged understanding. NT packing belongings and getting patient ready.

## 2018-12-02 NOTE — Discharge Summary (Signed)
[facility] NAME: Matthew David    MR#:  [id number]  DATE OF BIRTH:  [birth year]  DATE OF ADMISSION:  12/01/2018 ADMITTING PHYSICIAN: Hillary Bow, MD  DATE OF DISCHARGE: No discharge date for patient encounter.  PRIMARY CARE PHYSICIAN: Adin Hector, MD    ADMISSION DIAGNOSIS:  Dizziness [R42] Near syncope [R55]  DISCHARGE DIAGNOSIS:  Active Problems:   Dizziness   SECONDARY DIAGNOSIS:   Past Medical History:  Diagnosis Date  . 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  [name] DVT (deep venous thrombosis) ([place])   . Hx of appendectomy   . Hypertension   . Status post right knee replacement   . Stented coronary artery     HOSPITAL COURSE:   *Acute Orthostatic dizziness Resolved Exact etiology was unknown  Orthostatic testing was negative, treated with IV fluids for rehydration, placed on fall precautions, carotid Dopplers negative for any hemodynamically significant stenosis, CT head negative for any acute process, TSH was normal, cortisol level was normal, echocardiogram done-report pending at the time of discharge, patient able to ambulate without difficulty while in house without near syncope/lightheadedness/dizziness, will have patient discharged home with outpatient follow-up with primary care provider in 2 to 3 days for reevaluation/follow-up echocardiogram  *Chronic hypertension Stable on current regiment  DISCHARGE CONDITIONS:   stable  CONSULTS OBTAINED:    DRUG ALLERGIES:   Allergies  Allergen Reactions  . Gabapentin Nausea Only    DISCHARGE MEDICATIONS:   Allergies as of 12/02/2018      Reactions   Gabapentin Nausea Only      Medication List    TAKE these medications   acetaminophen 650 MG CR tablet Commonly known as:  TYLENOL Take 1,300 mg by mouth 2 (two) times daily.   ALIGN PO Take 1 capsule by mouth every morning.   aspirin 81 MG tablet Take 81 mg by mouth daily  after breakfast.   benzonatate 100 MG capsule Commonly known as:  TESSALON Take 100 mg by mouth 3 (three) times daily as needed.   celecoxib 200 MG capsule Commonly known as:  CELEBREX Take 200 mg by mouth. Monday, Wednesday, Friday   cetirizine 10 MG tablet Commonly known as:  ZYRTEC Take 10 mg by mouth daily.   CRANBERRY CONCENTRATE PO Take 4,200 mg by mouth daily.   LORazepam 1 MG tablet Commonly known as:  ATIVAN Take 1 mg by mouth at bedtime.   lovastatin 20 MG tablet Commonly known as:  MEVACOR Take 20 mg by mouth at bedtime.   Lysine 600 MG Tabs Take 1 tablet by mouth every morning.   metoprolol tartrate 25 MG tablet Commonly known as:  LOPRESSOR Take 12.5 mg by mouth 2 (two) times daily.   Mucinex Maximum Strength 1200 MG Tb12 Generic drug:  Guaifenesin Take 1,200 mg by mouth 2 (two) times daily.   multivitamin-iron-minerals-folic acid chewable tablet Chew 1 tablet by mouth every morning.   pantoprazole 40 MG tablet Commonly known as:  PROTONIX Take 40 mg by mouth 2 (two) times daily.   PROSTATE SR PO Take 1 tablet by mouth 2 (two) times daily.   tamsulosin 0.4 MG Caps capsule Commonly known as:  FLOMAX Take 0.4 mg by mouth daily after supper.   traMADol 50 MG tablet Commonly known as:  ULTRAM Take 50 mg by mouth every 6 (six) hours as needed (knee pain).   valsartan 40 MG tablet Commonly known as:  DIOVAN Take 40 mg by mouth daily.        DISCHARGE INSTRUCTIONS:      If you experience worsening of your admission symptoms, develop shortness of breath, life threatening emergency, suicidal or homicidal thoughts you must seek medical attention immediately by calling 911 or calling your MD immediately  if symptoms less severe.  You Must read complete instructions/literature along with all the possible adverse reactions/side effects for all the Medicines you take and that have been prescribed to you. Take any new Medicines after you have  completely understood and accept all the possible adverse reactions/side effects.   Please note  You were cared for by a hospitalist during your hospital stay. If you have any questions about your discharge medications or the care you received while you were in the hospital after you are discharged, you can call the unit and asked to speak with the hospitalist on call if the hospitalist that took care of you is not available. Once you are discharged, your primary care physician will handle any further medical issues. Please note that NO REFILLS for any discharge medications will be authorized once you are discharged, as it is imperative that you return to your primary care physician (or establish a relationship with a primary care physician if you do not have one) for your aftercare needs so that they can reassess your need for medications and monitor your lab values.    Today   CHIEF COMPLAINT:   Chief Complaint  Patient presents with  . Near Syncope    HISTORY OF PRESENT ILLNESS:  85 y.o. male with a known history of hypertension, DVT, vocal cord squamous cell carcinoma, hyperlipidemia who at baseline ambulates with a cane and lives with his wife with dementia presents to the emergency room complaining of acute onset of dizziness on sitting and standing since 4 AM.  Patient woke up at 4 AM went to the bathroom and got extremely dizzy and almost passed out.  Since then every time he stands up he gets dizzy.  Here in the emergency room he was given 1 L normal saline bolus.  Blood pressure checked with laying sitting and standing which is stable.  He does get tachycardic on standing.  Patient renal function is normal.  Chest x-ray shows nothing acute.  Being admitted for dizziness. No recent change in medications  VITAL SIGNS:  Blood pressure (!) 145/68, pulse 71, temperature 98.3 F (36.8 C), temperature source Oral, resp. rate 19, height 5\' 9"  (1.753 m), weight 79.4 kg, SpO2 96 %.  I/O:     Intake/Output Summary (Last 24 hours) at 12/02/2018 1029 Last data filed at 12/02/2018 7867 Gross per 24 hour  Intake 1156.84 ml  Output 550 ml  Net 606.84 ml    PHYSICAL EXAMINATION:  GENERAL:  85 y.o.-year-old patient lying in the bed with no acute distress.  EYES: Pupils equal, round, reactive to light and accommodation. No scleral icterus. Extraocular muscles intact.  HEENT: Head atraumatic, normocephalic. Oropharynx and nasopharynx clear.  NECK:  Supple, no jugular venous distention. No thyroid enlargement, no tenderness.  LUNGS: Normal breath sounds bilaterally, no wheezing, rales,rhonchi or crepitation. No use of accessory muscles of respiration.  CARDIOVASCULAR: S1, S2 normal. No murmurs, rubs, or gallops.  ABDOMEN: Soft, non-tender, non-distended. Bowel sounds present. No organomegaly or mass.  EXTREMITIES: No pedal edema, cyanosis, or clubbing.  NEUROLOGIC: Cranial nerves II through XII are intact. Muscle strength 5/5 in all extremities. Sensation intact. Gait not checked.  PSYCHIATRIC:  The patient is alert and oriented x 3.  SKIN: No obvious rash, lesion, or ulcer.   DATA REVIEW:   CBC Recent Labs  Lab 12/01/18 1210  WBC 4.8  HGB 12.6*  HCT 37.2*  PLT 110*    Chemistries  Recent Labs  Lab 12/01/18 1210 12/02/18 0426 12/02/18 0800  NA 142 143  --   K 3.5 3.3*  --   CL 111 111  --   CO2 24 23  --   GLUCOSE 111* 102*  --   BUN 30* 20  --   CREATININE 0.90 0.84  --   CALCIUM 9.1 8.6*  --   MG  --   --  1.9  AST 16  --   --   ALT 15  --   --   ALKPHOS 49  --   --   BILITOT 0.8  --   --     Cardiac Enzymes Recent Labs  Lab 12/01/18 1210  TROPONINI <0.03    Microbiology Results  No results found for this or any previous visit.  RADIOLOGY:  Ct Head Wo Contrast  Result Date: 12/01/2018 CLINICAL DATA:  Dizziness. EXAM: CT HEAD WITHOUT CONTRAST TECHNIQUE: Contiguous axial images were obtained from the base of the skull through the vertex without  intravenous contrast. COMPARISON:  CT scan of May 14, 2015. FINDINGS: Brain: Mild diffuse cortical atrophy is noted. Mild chronic ischemic white matter disease is noted. No mass effect or midline shift is noted. Ventricular size is within normal limits. There is no evidence of mass lesion, hemorrhage or acute infarction. Vascular: No hyperdense vessel or unexpected calcification. Skull: Normal. Negative for fracture or focal lesion. Sinuses/Orbits: No acute finding. Other: None. IMPRESSION: Mild diffuse cortical atrophy. Mild chronic ischemic white matter disease. No acute intracranial abnormality seen. Electronically Signed   By: Marijo Conception, M.D.   On: 12/01/2018 16:34   US Carotid Bilateral  Result Date: 12/01/2018 CLINICAL DATA:  Dizziness EXAM: BILATERAL CAROTID DUPLEX ULTRASOUND TECHNIQUE: Pearline Cables scale imaging, color Doppler and duplex ultrasound were performed of bilateral carotid and vertebral arteries in the neck. COMPARISON:  None. FINDINGS: Criteria: Quantification of carotid stenosis is based on velocity parameters that correlate the residual internal carotid diameter with NASCET-based stenosis levels, using the diameter of the distal internal carotid lumen as the denominator for stenosis measurement. The following velocity measurements were obtained: RIGHT ICA: 84 cm/sec CCA: 66 cm/sec SYSTOLIC ICA/CCA RATIO:  1.3 ECA: 101 cm/sec LEFT ICA: 97 cm/sec CCA: 83 cm/sec SYSTOLIC ICA/CCA RATIO:  1.2 ECA: 79 cm/sec RIGHT CAROTID ARTERY: Minimal calcified and noncalcified plaque at the RIGHT carotid ball. RIGHT VERTEBRAL ARTERY:  Normal antegrade flow. LEFT CAROTID ARTERY: Minimal calcified and noncalcified plaque within the distal LEFT common carotid artery. LEFT VERTEBRAL ARTERY:  Normal antegrade flow. IMPRESSION: Minimal plaque bilaterally. No evidence of hemodynamically significant stenosis within the bilateral common carotid or internal carotid arteries. Electronically Signed   By: Franki Cabot M.D.   On: 12/01/2018 17:14   Dg Chest Port 1 View  Result Date: 12/01/2018 CLINICAL DATA:  Acute onset of dizziness and multiple near syncopal episodes since approximately 4:30 a.m. this morning. Personal history of laryngeal cancer in 2016. EXAM: PORTABLE CHEST 1 VIEW COMPARISON:  03/29/2018 and earlier, including CT chest 08/30/2016. FINDINGS: Cardiac silhouette upper normal in size for AP portable technique. Lungs clear. Bronchovascular markings normal. Pulmonary vascularity normal. No visible pleural effusions. No pneumothorax. Remote healed fracture involving the LEFT humeral head and  neck. IMPRESSION: No acute cardiopulmonary disease. Electronically Signed   By: Evangeline Dakin M.D.   On: 12/01/2018 12:58    EKG:   Orders placed or performed during the hospital encounter of 12/01/18  . ED EKG  . ED EKG      Management plans discussed with the patient, family and they are in agreement.  CODE STATUS:     Code Status Orders  (From admission, onward)         Start     Ordered   12/01/18 1527  Full code  Continuous     12/01/18 1531        Code Status History    Date Active Date Inactive Code Status Order ID Comments User Context   05/07/2016 2242 05/08/2016 1421 Full Code [id number]  Idelle Crouch, MD Inpatient   08/13/2015 2301 08/15/2015 1815 Full Code [id number]  Nicholes Mango, MD Inpatient    Advance Directive Documentation     Most Recent Value  Type of Advance Directive  Healthcare Power of [place], Living will  Pre-existing out of facility DNR order (yellow form or pink MOST form)  -  "MOST" Form in Place?  -      TOTAL TIME TAKING CARE OF THIS PATIENT: 36minutes.    Avel Peace Melynda Krzywicki M.D on 12/02/2018 at 10:29 AM  Between 7am to 6pm - Pager - [phone]  After 6pm go to [email or web address] - password [email or web address] [facility]  Office  [phone]  CC: Primary care physician; Adin Hector, MD   Note: This dictation was  prepared with Dragon dictation along with smaller phrase technology. Any transcriptional errors that result from this process are unintentional.

## 2018-12-02 NOTE — Progress Notes (Signed)
   12/02/18 1007  Orthostatic Lying   BP- Lying 136/71  Pulse- Lying 92  Orthostatic Sitting  BP- Sitting 125/59  Pulse- Sitting 80  Orthostatic Standing at 0 minutes  BP- Standing at 0 minutes 137/56  Pulse- Standing at 0 minutes 81    BP sitting after dizziness subsided: 134/58 pulse 82  BP standing after 3 minutes : 121/64 pulse 81  Patient understands need to pause between transitions for safety.    Janna Arch, PT, DPT

## 2018-12-02 NOTE — Care Management Obs Status (Signed)
[facility] NOTIFICATION   Patient Details  Name: Matthew David MRN: [id number] Date of Birth: 1922/08/14   Medicare Observation Status Notification Given:  Yes    [name] A [name], RN 12/02/2018, 9:34 AM

## 2018-12-03 DIAGNOSIS — R55 Syncope and collapse: Secondary | ICD-10-CM | POA: Insufficient documentation

## 2020-01-23 ENCOUNTER — Other Ambulatory Visit: Payer: Self-pay

## 2020-01-23 ENCOUNTER — Emergency Department
Admission: EM | Admit: 2020-01-23 | Discharge: 2020-01-24 | Disposition: A | Discharge status: Home / Self Care | Payer: Medicare Other | Attending: Emergency Medicine | Admitting: Emergency Medicine

## 2020-01-23 DIAGNOSIS — I10 Essential (primary) hypertension: Secondary | ICD-10-CM | POA: Diagnosis present

## 2020-01-23 DIAGNOSIS — I2510 Atherosclerotic heart disease of native coronary artery without angina pectoris: Secondary | ICD-10-CM | POA: Diagnosis present

## 2020-01-23 DIAGNOSIS — I82402 Acute embolism and thrombosis of unspecified deep veins of left lower extremity: Secondary | ICD-10-CM | POA: Diagnosis present

## 2020-01-23 DIAGNOSIS — R413 Other amnesia: Secondary | ICD-10-CM | POA: Diagnosis not present

## 2020-01-23 DIAGNOSIS — Z87891 Personal history of nicotine dependence: Secondary | ICD-10-CM | POA: Insufficient documentation

## 2020-01-23 DIAGNOSIS — Z96651 Presence of right artificial knee joint: Secondary | ICD-10-CM | POA: Insufficient documentation

## 2020-01-23 DIAGNOSIS — L853 Xerosis cutis: Secondary | ICD-10-CM | POA: Diagnosis not present

## 2020-01-23 DIAGNOSIS — R290 Tetany: Secondary | ICD-10-CM | POA: Diagnosis present

## 2020-01-23 DIAGNOSIS — C329 Malignant neoplasm of larynx, unspecified: Secondary | ICD-10-CM | POA: Diagnosis present

## 2020-01-23 DIAGNOSIS — L308 Other specified dermatitis: Secondary | ICD-10-CM

## 2020-01-23 DIAGNOSIS — R079 Chest pain, unspecified: Secondary | ICD-10-CM | POA: Diagnosis present

## 2020-01-23 DIAGNOSIS — E785 Hyperlipidemia, unspecified: Secondary | ICD-10-CM | POA: Diagnosis present

## 2020-01-23 DIAGNOSIS — F4321 Adjustment disorder with depressed mood: Secondary | ICD-10-CM

## 2020-01-23 DIAGNOSIS — R42 Dizziness and giddiness: Secondary | ICD-10-CM

## 2020-01-23 DIAGNOSIS — R4182 Altered mental status, unspecified: Secondary | ICD-10-CM | POA: Diagnosis present

## 2020-01-23 DIAGNOSIS — F0390 Unspecified dementia without behavioral disturbance: Secondary | ICD-10-CM | POA: Diagnosis not present

## 2020-01-23 DIAGNOSIS — M79609 Pain in unspecified limb: Secondary | ICD-10-CM | POA: Diagnosis present

## 2020-01-23 LAB — CBC
HCT: 36.9 % — ABNORMAL LOW (ref 39.0–52.0)
Hemoglobin: 12.2 g/dL — ABNORMAL LOW (ref 13.0–17.0)
MCH: 31.1 pg (ref 26.0–34.0)
MCHC: 33.1 g/dL (ref 30.0–36.0)
MCV: 94.1 fL (ref 80.0–100.0)
Platelets: 122 10*3/uL — ABNORMAL LOW (ref 150–400)
RBC: 3.92 MIL/uL — ABNORMAL LOW (ref 4.22–5.81)
RDW: 13.3 % (ref 11.5–15.5)
WBC: 7.8 10*3/uL (ref 4.0–10.5)
nRBC: 0 % (ref 0.0–0.2)

## 2020-01-23 MED ORDER — SODIUM CHLORIDE 0.9 % IV BOLUS
1000.0000 mL | Freq: Once | INTRAVENOUS | Status: AC
  Administered 2020-01-24: 1000 mL via INTRAVENOUS

## 2020-01-23 NOTE — ED Triage Notes (Signed)
EMS called by family, pt from home with possible dehydration. Family states  Need for mental health assessment d/t hx dementia and recent passing of wife. Family requesting social work consult, pt lives at home alone. EMS reports pt has recent violent outbursts and verbal aggression toward family and EMS. Pt currently resting comfortably, cooperating with staff.  EMS VSS, CBG 190.

## 2020-01-24 DIAGNOSIS — F4321 Adjustment disorder with depressed mood: Secondary | ICD-10-CM | POA: Diagnosis not present

## 2020-01-24 LAB — URINALYSIS, COMPLETE (UACMP) WITH MICROSCOPIC
Bacteria, UA: NONE SEEN
Bilirubin Urine: NEGATIVE
Glucose, UA: NEGATIVE mg/dL
Hgb urine dipstick: NEGATIVE
Ketones, ur: NEGATIVE mg/dL
Nitrite: NEGATIVE
Protein, ur: NEGATIVE mg/dL
Specific Gravity, Urine: 1.027 (ref 1.005–1.030)
WBC, UA: >50 WBC/hpf — ABNORMAL HIGH (ref 0–5)
pH: 5 (ref 5.0–8.0)

## 2020-01-24 LAB — COMPREHENSIVE METABOLIC PANEL
ALT: 15 U/L (ref 0–44)
AST: 14 U/L — ABNORMAL LOW (ref 15–41)
Albumin: 3.3 g/dL — ABNORMAL LOW (ref 3.5–5.0)
Alkaline Phosphatase: 46 U/L (ref 38–126)
Anion gap: 7 (ref 5–15)
BUN: 45 mg/dL — ABNORMAL HIGH (ref 8–23)
CO2: 25 mmol/L (ref 22–32)
Calcium: 8.4 mg/dL — ABNORMAL LOW (ref 8.9–10.3)
Chloride: 112 mmol/L — ABNORMAL HIGH (ref 98–111)
Creatinine, Ser: 1.15 mg/dL (ref 0.61–1.24)
GFR calc Af Amer: >60 mL/min (ref >60)
GFR calc non Af Amer: 54 mL/min — ABNORMAL LOW (ref >60)
Glucose, Bld: 115 mg/dL — ABNORMAL HIGH (ref 70–99)
Potassium: 3.9 mmol/L (ref 3.5–5.1)
Sodium: 144 mmol/L (ref 135–145)
Total Bilirubin: 0.6 mg/dL (ref 0.3–1.2)
Total Protein: 5.6 g/dL — ABNORMAL LOW (ref 6.5–8.1)

## 2020-01-24 MED ORDER — HYDROCORTISONE 1 % EX CREA
TOPICAL_CREAM | Freq: Once | CUTANEOUS | Status: AC
  Filled 2020-01-24: qty 28

## 2020-01-24 NOTE — ED Notes (Signed)
Fluids infusing. Urinal at the bedside. Son in law at the bedside.

## 2020-01-24 NOTE — ED Provider Notes (Signed)
[facility] Emergency Department Provider Note  ____________________________________________   First MD Initiated Contact with Patient 01/23/20 2349     (approximate)  I have reviewed the triage vital signs and the nursing notes.   HISTORY  Chief Complaint Altered Mental Status   HPI Matthew David is a 86 y.o. male with below list of previous medical conditions presents to the emergency department in the custody of his son-in-law with concern of possible dehydration.  Patient's family states that the patient needs a mental health assessment with concern for dementia in the recent death of his wife.  Patient's family states that he has had multiple violent outburst's and verbal aggression towards his family.  Patient's wife was buried yesterday.  Patient admits that his recent behavior secondary to the loss of his wife.  Patient is refusing a psychiatric evaluation.  Patient denies any suicidal or homicidal ideation.        Past Medical History:  Diagnosis Date  . 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  [name] DVT (deep venous thrombosis) ([place])   . Hx of appendectomy   . Hx of squamous cell carcinoma 2015   multiple sites   . Hypertension   . Status post right knee replacement   . Stented coronary artery     Patient Active Problem List   Diagnosis Date Noted  . Dizziness 12/01/2018  . Pain in limb 08/19/2016  . Laryngeal cancer ([place]) 06/14/2016  . Chest pain 05/07/2016  . ASCVD (arteriosclerotic cardiovascular disease) 05/07/2016  . HLD (hyperlipidemia) 05/07/2016  . HTN (hypertension) 05/07/2016  . Left leg DVT ([place]) 08/13/2015  . [place] 08/13/2015    Past Surgical History:  Procedure Laterality Date  . APPENDECTOMY  1944  . CATARACT EXTRACTION W/ INTRAOCULAR LENS  IMPLANT, BILATERAL Bilateral 2011   both eye done in the same year  . CORONARY ANGIOPLASTY WITH STENT PLACEMENT  2012  . DIRECT LARYNGOSCOPY N/A 04/08/2015   Procedure:  DIRECT LARYNGOSCOPY;  Surgeon: Clyde Canterbury, MD;  Location: [facility] ORS;  Service: ENT;  Laterality: N/A;  . JOINT REPLACEMENT Right 2008   knee  . KNEE ARTHROSCOPY Right 2006  . SHOULDER SURGERY Left 2006  . TONSILLECTOMY  1952    Prior to Admission medications   Medication Sig Start Date End Date Taking? Authorizing Provider  acetaminophen (TYLENOL) 650 MG CR tablet Take 1,300 mg by mouth 2 (two) times daily.    Yes [provider]  amoxicillin-clavulanate (AUGMENTIN) 500-125 MG tablet Take 1 tablet by mouth 2 (two) times daily. 01/15/20  Yes [provider]  aspirin 81 MG tablet Take 81 mg by mouth daily after breakfast.   Yes [provider]  celecoxib (CELEBREX) 200 MG capsule Take 200 mg by mouth. Monday, Wednesday, Friday 10/22/18  Yes [provider]  cetirizine (ZYRTEC) 10 MG tablet Take 10 mg by mouth daily.   Yes [provider]  clotrimazole-betamethasone (LOTRISONE) cream APPLY TO THE AFFECTED AND SURROUNDING AREAS OF SKIN TWICE A DAY (MORNING AND EVENING) FOR 2 WEEKS 01/13/20  Yes [provider]  CRANBERRY CONCENTRATE PO Take 4,200 mg by mouth daily.    Yes [provider]  fluticasone (FLONASE) 50 MCG/ACT nasal spray SPRAY TWICE INTO EACH NOSTRIL ONCE DAILY 12/30/19  Yes [provider]  Guaifenesin (MUCINEX MAXIMUM STRENGTH) 1200 MG TB12 Take 1,200 mg by mouth 2 (two) times daily.   Yes [provider]  LORazepam (ATIVAN) 1 MG tablet Take 1 mg  by mouth at bedtime.   Yes [provider]  lovastatin (MEVACOR) 20 MG tablet Take 20 mg by mouth at bedtime.   Yes [provider]  Lysine 600 MG TABS Take 1 tablet by mouth every morning.    Yes [provider]  metoprolol tartrate (LOPRESSOR) 25 MG tablet Take 12.5 mg by mouth 2 (two) times daily.   Yes [provider]  multivitamin-iron-minerals-folic acid (CENTRUM) chewable tablet Chew 1 tablet by mouth every morning.   Yes  [provider]  pantoprazole (PROTONIX) 40 MG tablet Take 40 mg by mouth 2 (two) times daily.   Yes [provider]  Probiotic Product (ALIGN PO) Take 1 capsule by mouth every morning.   Yes [provider]  Saw Palmetto-Phytosterols (PROSTATE SR PO) Take 1 tablet by mouth 2 (two) times daily.   Yes [provider]  tamsulosin (FLOMAX) 0.4 MG CAPS capsule Take 0.4 mg by mouth daily after supper.    Yes [provider]  traMADol (ULTRAM) 50 MG tablet Take 50 mg by mouth every 6 (six) hours as needed (knee pain).   Yes [provider]  valsartan (DIOVAN) 40 MG tablet Take 40 mg by mouth daily. 09/10/18  Yes [provider]    Allergies Gabapentin  History reviewed. No pertinent family history.  Social History Social History   Tobacco Use  . Smoking status: Former Smoker    Types: 72, Pipe    Quit date: 03/30/1984    Years since quitting: 35.8  . Smokeless tobacco: Never Used  Substance Use Topics  . Alcohol use: No  . Drug use: No    Review of Systems Constitutional: No fever/chills Eyes: No visual changes. ENT: No sore throat. Cardiovascular: Denies chest pain. Respiratory: Denies shortness of breath. Gastrointestinal: No abdominal pain.  No nausea, no vomiting.  No diarrhea.  No constipation. Genitourinary: Negative for dysuria. Musculoskeletal: Negative for neck pain.  Negative for back pain. Integumentary: Negative for rash. Neurological: Negative for headaches, focal weakness or numbness. Psychiatric:  Depressed mood  ____________________________________________   PHYSICAL EXAM:  VITAL SIGNS: ED Triage Vitals  Enc Vitals Group     BP 01/23/20 2338 (!) 108/59     Pulse Rate 01/23/20 2338 89     Resp 01/23/20 2338 14     Temp 01/23/20 2338 98.4 F (36.9 C)     Temp Source 01/23/20 2338 Oral     SpO2 01/23/20 2338 99 %     Weight 01/23/20 2340 74.8 kg (165 lb)     Height 01/23/20 2340 1.778 m (5'  10")     Head Circumference --      Peak Flow --      Pain Score 01/23/20 2339 0     Pain Loc --      Pain Edu? --      Excl. in ? --     Constitutional: Alert and oriented x4 Eyes: Conjunctivae are normal.  Head: Atraumatic.[name] Mouth/Throat: Patient is wearing a mask. Neck: No stridor.  No meningeal signs.   Cardiovascular: Normal rate, regular rhythm. Good peripheral circulation. Grossly normal heart sounds. Respiratory: Normal respiratory effort.  No retractions. Gastrointestinal: Soft and nontender. No distention.  Musculoskeletal: No lower extremity tenderness nor edema. No gross deformities of extremities. Neurologic:  Normal speech and language. No gross focal neurologic deficits are appreciated.  Skin: Right forearm rash and left antecubital rash consistent asteatotic eczema Psychiatric: Mood and affect are normal. Speech and behavior are normal.  ____________________________________________   LABS (all labs ordered are listed, but only abnormal results are displayed)  Labs Reviewed  CBC - Abnormal; Notable for the following components:      Result Value   RBC 3.92 (*)    Hemoglobin 12.2 (*)    HCT 36.9 (*)    Platelets 122 (*)    All other components within normal limits  URINALYSIS, COMPLETE (UACMP) WITH MICROSCOPIC - Abnormal; Notable for the following components:   Color, Urine YELLOW (*)    APPearance CLOUDY (*)    Leukocytes,Ua MODERATE (*)    WBC, UA >50 (*)    All other components within normal limits  COMPREHENSIVE METABOLIC PANEL - Abnormal; Notable for the following components:   Chloride 112 (*)    Glucose, Bld 115 (*)    BUN 45 (*)    Calcium 8.4 (*)    Total Protein 5.6 (*)    Albumin 3.3 (*)    AST 14 (*)    GFR calc non Af Amer 54 (*)    All other components within normal limits   _______________________________________  Procedures   ____________________________________________   INITIAL IMPRESSION / MDM / ASSESSMENT AND PLAN / ED  COURSE  As part of my medical decision making, I reviewed the following data within the electronic MEDICAL RECORD NUMBER   86 year old male presented with above-stated history and physical exam differential diagnosis including but not limited to grief reaction, memory disturbance, dementia.  Patient is alert and oriented x4 and shows appropriate insight in regards to his anger secondary to the loss of his wife.  During our conversation the patient did repetitively say the argument that he had earlier was with his sister when in actuality it was his daughter with the exception of this the patient was completely appropriate and accurate with his recollection.  Patient was evaluated by the psychiatry nurse practitioner Grandville Silos who recommended inpatient admission however the patient adamantly opposed this and is not willing to remain in the emergency department.  Patient does not meet criteria for involuntary commitment and as such I spoke with the patient's daughter and son-in-law who state that they have had conversations with the patient regarding placement at home place.  Patient son-in-law states that he would remain at home with his father-in-law tonight and the family would proceed with plans for assisted living/skilled nursing facility.  Patient denied any suicidal or homicidal ideation ____________________________________________  FINAL CLINICAL IMPRESSION(S) / ED DIAGNOSES  Final diagnoses:  Grief reaction  Asteatotic eczema     MEDICATIONS GIVEN DURING THIS VISIT:  Medications  sodium chloride 0.9 % bolus 1,000 mL (0 mLs Intravenous Stopped 01/24/20 0126)     ED Discharge Orders    None      *Please note:  Matthew David was evaluated in Emergency Department on 01/24/2020 for the symptoms described in the history of present illness. He was evaluated in the context of the global COVID-19 pandemic, which necessitated consideration that the patient might be at risk for infection with the  SARS-CoV-2 virus that causes COVID-19. Institutional protocols and algorithms that pertain to the evaluation of patients at risk for COVID-19 are in a state of rapid change based on information released by regulatory bodies including the CDC and federal and state organizations. These policies and algorithms were followed during the patient's care in the ED.  Some ED evaluations and interventions may be delayed as a result of limited staffing during the pandemic.*  Note:  This document was prepared using  Dragon Armed forces training and education officer and may include unintentional dictation errors.   Gregor Hams, MD 01/24/20 [id number]

## 2020-01-24 NOTE — ED Notes (Signed)
Green top redrawn and sent to lab. 

## 2020-01-24 NOTE — Consult Note (Signed)
[facility] Face-to-Face Psychiatry Consult   Reason for Consult: Psychiatric evaluation Referring Physician: Dr. Owens Shark Patient Identification: Matthew David MRN:  [id number] Principal Diagnosis: <principal problem not specified> Diagnosis:  Active Problems:   Left leg DVT ([place])   [place]   Chest pain   ASCVD (arteriosclerotic cardiovascular disease)   HLD (hyperlipidemia)   HTN (hypertension)   Laryngeal cancer (HCC)   Pain in limb   Dizziness   Total Time spent with patient: 1 hour  Subjective: " I am not staying the night here." Matthew David is a 86 y.o. male patient presented to [facility] ED via EMS voluntary. Per the ED triage nurse note, EMS was called by the patient's family due to possible dehydration.  The patient wife of 68 years passed away on 11-22-2022 (28-Jan-2020) and was buried on (6.3.21). Per the patient's daughters and son-in-law, he is not safe at home.  It was reported that the patient has a history of dementia, and the family is requesting social work consult.  The patient currently lives alone per EMS, informing that  the patient has a history of recent violent outbursts and verbal aggression towards family and EMS staff. The patient was seen face-to-face by this provider; the chart was reviewed and consulted with Dr. Owens Shark on 01/24/2020 due to the patient's care. It was discussed with the EDP that the patient does meet the criteria to be admitted to the geriatric psychiatric inpatient unit, SNIF, assisted living facility placement, or if possible, he could benefit from having a home health aide at home with him currently as he deals with the loss of his wife. The patient is totally against all of the above suggestions.  He states, "the only thing anybody can do for me is let me go home.  I am going home tonight.  I am not staying one night in this hospital." On evaluation, the patient is alert and oriented x 3, anxious, agitated, angry, depressed uncooperative, and mood-congruent with  affect. The patient does not appear to be responding to internal or external stimuli. Neither is the patient presenting with any delusional thinking. The patient denies auditory or visual hallucinations. The patient denies any suicidal, homicidal, or self-harm ideations. The patient is not presenting with psychotic or paranoid behaviors.  The patient is experiencing bouts of confusion, but this is most apparent during the assessment process.  During an encounter with the patient, he was able to answer most questions appropriately. Collateral was obtained from his son-in-law Matthew David who lives in [place].  Matthew David voiced the entire family will be leaving today (6.4.21) returning to their homes, and the patient is unable to live alone in his home.  He expressed it is a safety concern for the entire family, and they would like for the patient to be placed in a facility. Plan: The patient is a safety risk to self and does require geriatric psychiatric inpatient admission, SNIF, or an assisted living facility for safety, stabilization, and treatment.  HPI:   Past Psychiatric History: No pertinent psychiatric history  Risk to Self:   Yes Risk to Others:   Yes Prior Inpatient Therapy:   None Prior Outpatient Therapy:   None  Past Medical History:  Past Medical History:  Diagnosis Date  . Cancer [facility])    Larynx Cancer 03/2015, Rad tx's.  [name] DVT (deep venous thrombosis) ([place])   . Hx of appendectomy   . Hx of squamous cell carcinoma 2015   multiple sites   . Hypertension   .  Status post right knee replacement   . Stented coronary artery     Past Surgical History:  Procedure Laterality Date  . APPENDECTOMY  1944  . CATARACT EXTRACTION W/ INTRAOCULAR LENS  IMPLANT, BILATERAL Bilateral 2011   both eye done in the same year  . CORONARY ANGIOPLASTY WITH STENT PLACEMENT  2012  . DIRECT LARYNGOSCOPY N/A 04/08/2015   Procedure: DIRECT LARYNGOSCOPY;  Surgeon: Clyde Canterbury, MD;  Location: [facility] ORS;   Service: ENT;  Laterality: N/A;  . JOINT REPLACEMENT Right 2008   knee  . KNEE ARTHROSCOPY Right 2006  . SHOULDER SURGERY Left 2006  . TONSILLECTOMY  1952   Family History: History reviewed. No pertinent family history. Family Psychiatric  History:  Social History:  Social History   Substance and Sexual Activity  Alcohol Use No     Social History   Substance and Sexual Activity  Drug Use No    Social History   Socioeconomic History  . Marital status: Widowed    Spouse name: Not on file  . Number of children: Not on file  . Years of education: Not on file  . Highest education level: Not on file  Occupational History  . Not on file  Tobacco Use  . Smoking status: Former Smoker    Types: 40, Pipe    Quit date: 03/30/1984    Years since quitting: 35.8  . Smokeless tobacco: Never Used  Substance and Sexual Activity  . Alcohol use: No  . Drug use: No  . Sexual activity: Not on file  Other Topics Concern  . Not on file  Social History Narrative  . Not on file   Social Determinants of Health   Financial Resource Strain:   . Difficulty of Paying Living Expenses:   Food Insecurity:   . Worried About Charity fundraiser in the Last Year:   . Arboriculturist in the Last Year:   Transportation Needs:   . Film/video editor (Medical):   [name] Lack of Transportation (Non-Medical):   Physical Activity:   . Days of Exercise per Week:   . Minutes of Exercise per Session:   Stress:   . Feeling of Stress :   Social Connections:   . Frequency of Communication with Friends and Family:   . Frequency of Social Gatherings with Friends and Family:   . Attends Religious Services:   . Active Member of Clubs or Organizations:   . Attends Archivist Meetings:   [name] Marital Status:    Additional Social History:    Allergies:   Allergies  Allergen Reactions  . Gabapentin Nausea Only    Labs:  Results for orders placed or performed during the hospital encounter  of 01/23/20 (from the past 48 hour(s))  CBC     Status: Abnormal   Collection Time: 01/23/20 11:46 PM  Result Value Ref Range   WBC 7.8 4.0 - 10.5 K/uL   RBC 3.92 (L) 4.22 - 5.81 MIL/uL   Hemoglobin 12.2 (L) 13.0 - 17.0 g/dL   HCT 36.9 (L) 39.0 - 52.0 %   MCV 94.1 80.0 - 100.0 fL   MCH 31.1 26.0 - 34.0 pg   MCHC 33.1 30.0 - 36.0 g/dL   RDW 13.3 11.5 - 15.5 %   Platelets 122 (L) 150 - 400 K/uL   nRBC 0.0 0.0 - 0.2 %    Comment: Performed at [facility], [address], [place], [place] [address]  Comprehensive metabolic panel  Status: Abnormal   Collection Time: 01/24/20 12:25 AM  Result Value Ref Range   Sodium 144 135 - 145 mmol/L   Potassium 3.9 3.5 - 5.1 mmol/L   Chloride 112 (H) 98 - 111 mmol/L   CO2 25 22 - 32 mmol/L   Glucose, Bld 115 (H) 70 - 99 mg/dL    Comment: Glucose reference range applies only to samples taken after fasting for at least 8 hours.   BUN 45 (H) 8 - 23 mg/dL   Creatinine, Ser 1.15 0.61 - 1.24 mg/dL   Calcium 8.4 (L) 8.9 - 10.3 mg/dL   Total Protein 5.6 (L) 6.5 - 8.1 g/dL   Albumin 3.3 (L) 3.5 - 5.0 g/dL   AST 14 (L) 15 - 41 U/L   ALT 15 0 - 44 U/L   Alkaline Phosphatase 46 38 - 126 U/L   Total Bilirubin 0.6 0.3 - 1.2 mg/dL   GFR calc non Af Amer 54 (L) >60 mL/min   GFR calc Af Amer >60 >60 mL/min   Anion gap 7 5 - 15    Comment: Performed at [facility], [address] [place], [place] [address]  Urinalysis, Complete w Microscopic     Status: Abnormal   Collection Time: 01/24/20  2:27 AM  Result Value Ref Range   Color, Urine YELLOW (A) YELLOW   APPearance CLOUDY (A) CLEAR   Specific Gravity, Urine 1.027 1.005 - 1.030   pH 5.0 5.0 - 8.0   Glucose, UA NEGATIVE NEGATIVE mg/dL   Hgb urine dipstick NEGATIVE NEGATIVE   Bilirubin Urine NEGATIVE NEGATIVE   Ketones, ur NEGATIVE NEGATIVE mg/dL   Protein, ur NEGATIVE NEGATIVE mg/dL   Nitrite NEGATIVE NEGATIVE   Leukocytes,Ua MODERATE (A) NEGATIVE   RBC / HPF 0-5 0 - 5  RBC/hpf   WBC, UA >50 (H) 0 - 5 WBC/hpf   Bacteria, UA NONE SEEN NONE SEEN   Squamous Epithelial / LPF 0-5 0 - 5   Mucus PRESENT    Hyaline Casts, UA PRESENT     Comment: Performed at [facility], [address] [place], [place] [address]    No current facility-administered medications for this encounter.   Current Outpatient Medications  Medication Sig Dispense Refill  . acetaminophen (TYLENOL) 650 MG CR tablet Take 1,300 mg by mouth 2 (two) times daily.     [name] amoxicillin-clavulanate (AUGMENTIN) 500-125 MG tablet Take 1 tablet by mouth 2 (two) times daily.    [name] aspirin 81 MG tablet Take 81 mg by mouth daily after breakfast.    . celecoxib (CELEBREX) 200 MG capsule Take 200 mg by mouth. Monday, Wednesday, Friday    . cetirizine (ZYRTEC) 10 MG tablet Take 10 mg by mouth daily.    . clotrimazole-betamethasone (LOTRISONE) cream APPLY TO THE AFFECTED AND SURROUNDING AREAS OF SKIN TWICE A DAY (MORNING AND EVENING) FOR 2 WEEKS    . CRANBERRY CONCENTRATE PO Take 4,200 mg by mouth daily.     . fluticasone (FLONASE) 50 MCG/ACT nasal spray SPRAY TWICE INTO EACH NOSTRIL ONCE DAILY    . Guaifenesin (MUCINEX MAXIMUM STRENGTH) 1200 MG TB12 Take 1,200 mg by mouth 2 (two) times daily.    [name] LORazepam (ATIVAN) 1 MG tablet Take 1 mg by mouth at bedtime.    . lovastatin (MEVACOR) 20 MG tablet Take 20 mg by mouth at bedtime.    [name] Lysine 600 MG TABS Take 1 tablet by mouth every morning.     . metoprolol tartrate (  LOPRESSOR) 25 MG tablet Take 12.5 mg by mouth 2 (two) times daily.    . multivitamin-iron-minerals-folic acid (CENTRUM) chewable tablet Chew 1 tablet by mouth every morning.    . pantoprazole (PROTONIX) 40 MG tablet Take 40 mg by mouth 2 (two) times daily.    . Probiotic Product (ALIGN PO) Take 1 capsule by mouth every morning.    Clarnce Flock Palmetto-Phytosterols (PROSTATE SR PO) Take 1 tablet by mouth 2 (two) times daily.    . tamsulosin (FLOMAX) 0.4 MG CAPS capsule Take 0.4 mg by mouth  daily after supper.     . traMADol (ULTRAM) 50 MG tablet Take 50 mg by mouth every 6 (six) hours as needed (knee pain).    . valsartan (DIOVAN) 40 MG tablet Take 40 mg by mouth daily.      Musculoskeletal: Strength & Muscle Tone: within normal limits Gait & Station: normal Patient leans: N/A  Psychiatric Specialty Exam: Physical Exam  Nursing note and vitals reviewed. Constitutional: He is oriented to person, place, and time.  Cardiovascular: Normal rate.  Musculoskeletal:        General: Normal range of motion.     Cervical back: Normal range of motion and neck supple.  Neurological: He is alert and oriented to person, place, and time.    Review of Systems  Psychiatric/Behavioral: Positive for agitation and confusion. The patient is nervous/anxious.   All other systems reviewed and are negative.   Blood pressure (!) 149/70, pulse 88, temperature 98.4 F (36.9 C), temperature source Oral, resp. rate 20, height 5\' 10"  (1.778 m), weight 74.8 kg, SpO2 97 %.Body mass index is 23.68 kg/m.  General Appearance: Casual and Guarded  Eye Contact:  Good  Speech:  Clear and Coherent and mostly with periods of confusions  Volume:  Normal  Mood:  Angry, Anxious and Irritable  Affect:  Blunt, Depressed and Flat  Thought Process:  Coherent  Orientation:  Full (Time, Place, and Person)  Thought Content:  Logical  Suicidal Thoughts:  No  Homicidal Thoughts:  No  Memory:  Immediate;   Fair Recent;   Fair Remote;   Fair  Judgement:  Poor  Insight:  Lacking  Psychomotor Activity:  Increased  Concentration:  Concentration: Good and Attention Span: Good  Recall:  Good  Fund of Knowledge:  Fair  Language:  Good  Akathisia:  Negative  Handed:  Right  AIMS (if indicated):     Assets:  Communication Skills Desire for Improvement Physical Health Resilience Social Support  ADL's:  Intact  Cognition:  Impaired,  Mild  Sleep:         Treatment Plan Summary: Plan Patient meets criteria  for geriatric psychiatric inpatient admission, social work consult to assist with placement to a SNF or assisted living facility.  Disposition: Recommend psychiatric Inpatient admission when medically cleared. Supportive therapy provided about ongoing stressors.  Caroline Sauger, NP 01/24/2020 3:16 AM

## 2020-01-25 LAB — URINE CULTURE: Culture: NO GROWTH

## 2020-03-25 ENCOUNTER — Ambulatory Visit (INDEPENDENT_AMBULATORY_CARE_PROVIDER_SITE_OTHER): Payer: Medicare Other | Admitting: Dermatology

## 2020-03-25 ENCOUNTER — Encounter: Payer: Self-pay | Admitting: Dermatology

## 2020-03-25 ENCOUNTER — Other Ambulatory Visit: Payer: Self-pay

## 2020-03-25 DIAGNOSIS — L578 Other skin changes due to chronic exposure to nonionizing radiation: Secondary | ICD-10-CM

## 2020-03-25 DIAGNOSIS — L821 Other seborrheic keratosis: Secondary | ICD-10-CM

## 2020-03-25 DIAGNOSIS — L570 Actinic keratosis: Secondary | ICD-10-CM

## 2020-03-25 DIAGNOSIS — L820 Inflamed seborrheic keratosis: Secondary | ICD-10-CM

## 2020-03-25 DIAGNOSIS — D692 Other nonthrombocytopenic purpura: Secondary | ICD-10-CM

## 2020-03-25 NOTE — Patient Instructions (Signed)
Cryotherapy Aftercare  . Wash gently with soap and water everyday.   . Apply Vaseline and Band-Aid daily until healed.  

## 2020-03-25 NOTE — Progress Notes (Signed)
   Follow-Up Visit   Subjective  Matthew David is a 86 y.o. male who presents for the following: check spots (face, scalp, ~42m, no symptoms).  The following portions of the chart were reviewed this encounter and updated as appropriate:  Tobacco  Allergies  Meds  Problems  Med Hx  Surg Hx  Fam Hx     Review of Systems:  No other skin or systemic complaints except as noted in HPI or Assessment and Plan.  Objective  Well appearing patient in no apparent distress; mood and affect are within normal limits.  A focused examination was performed including face, scalp. Relevant physical exam findings are noted in the Assessment and Plan.  Objective  Scalp/ears/face x 12 (12): Pink scaly macules   Objective  L cheek x 3, R temple x 2 (5): Erythematous keratotic or waxy stuck-on papule or plaque.    Assessment & Plan   Actinic Damage - diffuse scaly erythematous macules with underlying dyspigmentation - Recommend daily broad spectrum sunscreen SPF 30+ to sun-exposed areas, reapply every 2 hours as needed.  - Call for new or changing lesions.  Seborrheic Keratoses - Stuck-on, waxy, tan-brown papules and plaques  - Discussed benign etiology and prognosis. - Observe - Call for any changes  AK (actinic keratosis) (12) Scalp/ears/face x 12  Destruction of lesion - Scalp/ears/face x 12 Complexity: simple   Destruction method: cryotherapy   Informed consent: discussed and consent obtained   Timeout:  patient name, date of birth, surgical site, and procedure verified Lesion destroyed using liquid nitrogen: Yes   Region frozen until ice ball extended beyond lesion: Yes   Outcome: patient tolerated procedure well with no complications   Post-procedure details: wound care instructions given    Inflamed seborrheic keratosis (5) L cheek x 3, R temple x 2  Destruction of lesion - L cheek x 3, R temple x 2 Complexity: simple   Destruction method: cryotherapy   Informed consent:  discussed and consent obtained   Timeout:  patient name, date of birth, surgical site, and procedure verified Lesion destroyed using liquid nitrogen: Yes   Region frozen until ice ball extended beyond lesion: Yes   Outcome: patient tolerated procedure well with no complications   Post-procedure details: wound care instructions given     Purpura - Violaceous macules and patches - Benign - Related to age, sun damage and/or use of blood thinners - Observe - Can use OTC arnica containing moisturizer such as Dermend Bruise Formula if desired - Call for worsening or other concerns   Return in about 1 year (around 03/25/2021) for AK f/u.  I, Othelia Pulling, RMA, am acting as scribe for Sarina Ser, MD .  Documentation: I have reviewed the above documentation for accuracy and completeness, and I agree with the above.  Sarina Ser, MD

## 2020-03-26 ENCOUNTER — Encounter: Payer: Self-pay | Admitting: Dermatology

## 2020-04-20 ENCOUNTER — Other Ambulatory Visit: Payer: Self-pay | Admitting: Neurology

## 2020-04-20 ENCOUNTER — Other Ambulatory Visit (HOSPITAL_COMMUNITY): Payer: Self-pay | Admitting: Neurology

## 2020-04-20 DIAGNOSIS — R4189 Other symptoms and signs involving cognitive functions and awareness: Secondary | ICD-10-CM

## 2020-05-01 ENCOUNTER — Ambulatory Visit (HOSPITAL_COMMUNITY)
Admission: RE | Admit: 2020-05-01 | Discharge: 2020-05-01 | Disposition: A | Discharge status: Home / Self Care | Payer: Medicare Other | Source: Ambulatory Visit | Attending: Neurology | Admitting: Neurology

## 2020-05-01 DIAGNOSIS — R4189 Other symptoms and signs involving cognitive functions and awareness: Secondary | ICD-10-CM

## 2020-05-21 ENCOUNTER — Ambulatory Visit: Payer: Medicare Other | Admitting: Urology

## 2020-06-03 NOTE — Progress Notes (Signed)
06/04/2020 1:45 PM   Matthew David 1922-07-27 [id number]  Referring provider: Adin Hector, MD [facility] [place],  [place] [address] Chief Complaint  Patient presents with   Hematuria    HPI: Matthew David is a 86 y.o. male who presents today for evaluation and management of asymptomatic microscopic hematuria and urinary hesitancy.   -He was previously followed by Dr. Yves Dill. -He last saw Dr. Yves Dill 6 weeks ago and he performed a cystoscopy.  -Patient recently saw his PCP and noted incomplete bladder emptying.  -On tamsulosin -UA showed trace blood, nitrite positive, 10-50 WBC, no RBC, and many bacteria.  -He has been having urinary symptoms off and on for 1 year now.  -He reports nocturia x 2-3, having trouble starting flow and inadequate emptying.  -No burning or pain but he has discomfort with urination.    PMH: Past Medical History:  Diagnosis Date   Cancer ([place])    Larynx Cancer 03/2015, Rad tx's.   DVT (deep venous thrombosis) (HCC)    Hx of appendectomy    Hx of squamous cell carcinoma 2015   multiple sites    Hypertension    Squamous cell carcinoma of skin 12/03/2013   L cheek, SCC IS   Squamous cell carcinoma of skin 09/26/2016   L medial cheek, SCC IS   Status post right knee replacement    Stented coronary artery     Surgical History: Past Surgical History:  Procedure Laterality Date   APPENDECTOMY  1944   CATARACT EXTRACTION W/ INTRAOCULAR LENS  IMPLANT, BILATERAL Bilateral 2011   both eye done in the same year   [place]  2012   DIRECT LARYNGOSCOPY N/A 04/08/2015   Procedure: DIRECT LARYNGOSCOPY;  Surgeon: Clyde Canterbury, MD;  Location: [facility] ORS;  Service: ENT;  Laterality: N/A;   JOINT REPLACEMENT Right 2008   knee   KNEE ARTHROSCOPY Right 2006   SHOULDER SURGERY Left 2006   TONSILLECTOMY  1952    Home Medications:  Allergies as of 06/04/2020      Reactions    Gabapentin Nausea Only      Medication List       Accurate as of June 04, 2020  1:45 PM. If you have any questions, ask your nurse or doctor.        STOP taking these medications   amoxicillin-clavulanate 500-125 MG tablet Commonly known as: AUGMENTIN Stopped by: Abbie Sons, MD   clotrimazole-betamethasone cream Commonly known as: LOTRISONE Stopped by: Abbie Sons, MD     TAKE these medications   acetaminophen 650 MG CR tablet Commonly known as: TYLENOL Take 1,300 mg by mouth 2 (two) times daily.   ALIGN PO Take 1 capsule by mouth every morning.   aspirin 81 MG tablet Take 81 mg by mouth daily after breakfast.   benzonatate 100 MG capsule Commonly known as: TESSALON Take 200 mg by mouth every 8 (eight) hours as needed.   celecoxib 200 MG capsule Commonly known as: CELEBREX Take 200 mg by mouth. Monday, Wednesday, Friday   cetirizine 10 MG tablet Commonly known as: ZYRTEC Take 10 mg by mouth daily.   CRANBERRY CONCENTRATE PO Take 4,200 mg by mouth daily.   fluticasone 50 MCG/ACT nasal spray Commonly known as: FLONASE SPRAY TWICE INTO EACH NOSTRIL ONCE DAILY   Lifitegrast 5 % Soln Apply to eye.   LORazepam 1 MG tablet Commonly known as: ATIVAN Take 1 mg by mouth  at bedtime.   lovastatin 20 MG tablet Commonly known as: MEVACOR Take 20 mg by mouth at bedtime.   Lysine 500 MG Caps Take 650 mg by mouth. What changed: Another medication with the same name was removed. Continue taking this medication, and follow the directions you see here. Changed by: Abbie Sons, MD   metoprolol tartrate 25 MG tablet Commonly known as: LOPRESSOR Take 12.5 mg by mouth 2 (two) times daily.   Mucinex Maximum Strength 1200 MG Tb12 Generic drug: Guaifenesin Take 1,200 mg by mouth 2 (two) times daily.   multivitamin-iron-minerals-folic acid chewable tablet Chew 1 tablet by mouth every morning.   pantoprazole 40 MG tablet Commonly known as:  PROTONIX Take 40 mg by mouth 2 (two) times daily.   PROSTATE SR PO Take 1 tablet by mouth 2 (two) times daily.   tamsulosin 0.4 MG Caps capsule Commonly known as: FLOMAX Take 0.4 mg by mouth daily after supper.   traMADol 50 MG tablet Commonly known as: ULTRAM Take 50 mg by mouth every 6 (six) hours as needed (knee pain).   triamcinolone cream 0.1 % Commonly known as: KENALOG Apply topically 2 (two) times daily as needed.   valsartan 40 MG tablet Commonly known as: DIOVAN Take 40 mg by mouth daily.       Allergies:  Allergies  Allergen Reactions   Gabapentin Nausea Only    Family History: No family history on file.  Social History:  reports that he quit smoking about 36 years ago. His smoking use included cigars and pipe. He has never used smokeless tobacco. He reports that he does not drink alcohol and does not use drugs.   Physical Exam: BP (!) 97/59 (BP Location: Left Arm, Patient Position: Sitting, Cuff Size: Normal)    Pulse 60    Ht 5\' 9"  (1.753 m)    Wt 149 lb (67.6 kg)    BMI 22.00 kg/m   Constitutional:  Alert and oriented, No acute distress. HEENT: [place] AT, moist mucus membranes.  Trachea midline, no masses. Cardiovascular: No clubbing, cyanosis, or edema. Respiratory: Normal respiratory effort, no increased work of breathing. GI: Abdomen is soft, nontender, nondistended, no abdominal masses GU: No CVA tenderness. 50 g prostate, smooth no nodules.  Lymph: No cervical or inguinal lymphadenopathy. Skin: No rashes, bruises or suspicious lesions. Neurologic: Grossly intact, no focal deficits, moving all 4 extremities. Psychiatric: Normal mood and affect.  Laboratory Data:  Lab Results  Component Value Date   CREATININE 1.15 01/24/2020    Urinalysis Dipstick 3+ leukocytes/nitrite positive Microscopy >30 WBC/many bacteria "  Pertinent Imaging: Results for orders placed or performed in visit on 06/04/20  Bladder Scan (Post Void Residual) in office   Result Value Ref Range   Scan Result 261      Assessment & Plan:    1. Pyuria UA today shows >30 WBC Based on previous records this is a chronic issues and likely related to incomplete bladder emptying. Asymptomatic  Previous Dr. Yves Dill patient, will have him sign a record release form.   2. BPH with incomplete bladder emptying  Stable non bothersome lower urinary tract symptoms.  Continue tamsulosin Bladder scan by PVR is elevated at 261 mL    [facility] [address] [place], [place] [address] [phone]  I, Selena Batten, am acting as a scribe for Dr. Nicki Reaper C. Magdaleno Lortie,  I have reviewed the above documentation for accuracy and completeness, and I agree with the above.  Abbie Sons, MD

## 2020-06-04 ENCOUNTER — Other Ambulatory Visit: Payer: Self-pay

## 2020-06-04 ENCOUNTER — Ambulatory Visit (INDEPENDENT_AMBULATORY_CARE_PROVIDER_SITE_OTHER): Payer: Medicare Other | Admitting: Urology

## 2020-06-04 ENCOUNTER — Encounter: Payer: Self-pay | Admitting: Urology

## 2020-06-04 VITALS — BP 97/59 | HR 60 | Ht 69.0 in | Wt 149.0 lb

## 2020-06-04 DIAGNOSIS — R3121 Asymptomatic microscopic hematuria: Secondary | ICD-10-CM | POA: Diagnosis not present

## 2020-06-04 DIAGNOSIS — R8281 Pyuria: Secondary | ICD-10-CM | POA: Diagnosis not present

## 2020-06-04 DIAGNOSIS — M5136 Other intervertebral disc degeneration, lumbar region: Secondary | ICD-10-CM | POA: Insufficient documentation

## 2020-06-04 DIAGNOSIS — C320 Malignant neoplasm of glottis: Secondary | ICD-10-CM | POA: Insufficient documentation

## 2020-06-04 DIAGNOSIS — M419 Scoliosis, unspecified: Secondary | ICD-10-CM | POA: Insufficient documentation

## 2020-06-04 DIAGNOSIS — N401 Enlarged prostate with lower urinary tract symptoms: Secondary | ICD-10-CM

## 2020-06-04 DIAGNOSIS — D61818 Other pancytopenia: Secondary | ICD-10-CM | POA: Insufficient documentation

## 2020-06-04 DIAGNOSIS — R3914 Feeling of incomplete bladder emptying: Secondary | ICD-10-CM

## 2020-06-04 DIAGNOSIS — N400 Enlarged prostate without lower urinary tract symptoms: Secondary | ICD-10-CM | POA: Insufficient documentation

## 2020-06-04 DIAGNOSIS — Z8739 Personal history of other diseases of the musculoskeletal system and connective tissue: Secondary | ICD-10-CM | POA: Insufficient documentation

## 2020-06-04 LAB — URINALYSIS, COMPLETE
Bilirubin, UA: NEGATIVE
Glucose, UA: NEGATIVE
Ketones, UA: NEGATIVE
Nitrite, UA: POSITIVE — AB
Protein,UA: NEGATIVE
Specific Gravity, UA: 1.02 (ref 1.005–1.030)
Urobilinogen, Ur: 0.2 mg/dL (ref 0.2–1.0)
pH, UA: 5 (ref 5.0–7.5)

## 2020-06-04 LAB — MICROSCOPIC EXAMINATION: WBC, UA: >30 /hpf — AB (ref 0–5)

## 2020-06-04 LAB — BLADDER SCAN AMB NON-IMAGING: Scan Result: 261

## 2020-06-07 ENCOUNTER — Encounter: Payer: Self-pay | Admitting: Urology

## 2021-04-01 ENCOUNTER — Encounter: Payer: Medicare Other | Admitting: Dermatology

## 2021-05-03 ENCOUNTER — Other Ambulatory Visit: Payer: Self-pay

## 2021-05-03 ENCOUNTER — Ambulatory Visit (INDEPENDENT_AMBULATORY_CARE_PROVIDER_SITE_OTHER): Payer: Medicare Other | Admitting: Dermatology

## 2021-05-03 DIAGNOSIS — B353 Tinea pedis: Secondary | ICD-10-CM | POA: Diagnosis not present

## 2021-05-03 DIAGNOSIS — B351 Tinea unguium: Secondary | ICD-10-CM | POA: Diagnosis not present

## 2021-05-03 DIAGNOSIS — Z1283 Encounter for screening for malignant neoplasm of skin: Secondary | ICD-10-CM

## 2021-05-03 DIAGNOSIS — L820 Inflamed seborrheic keratosis: Secondary | ICD-10-CM | POA: Diagnosis not present

## 2021-05-03 DIAGNOSIS — D1800 Hemangioma unspecified site: Secondary | ICD-10-CM

## 2021-05-03 DIAGNOSIS — D229 Melanocytic nevi, unspecified: Secondary | ICD-10-CM

## 2021-05-03 DIAGNOSIS — L570 Actinic keratosis: Secondary | ICD-10-CM | POA: Diagnosis not present

## 2021-05-03 DIAGNOSIS — L578 Other skin changes due to chronic exposure to nonionizing radiation: Secondary | ICD-10-CM

## 2021-05-03 DIAGNOSIS — L821 Other seborrheic keratosis: Secondary | ICD-10-CM

## 2021-05-03 DIAGNOSIS — Z85828 Personal history of other malignant neoplasm of skin: Secondary | ICD-10-CM | POA: Diagnosis not present

## 2021-05-03 DIAGNOSIS — L814 Other melanin hyperpigmentation: Secondary | ICD-10-CM

## 2021-05-03 MED ORDER — KETOCONAZOLE 2 % EX CREA: 1.0000 "application " | TOPICAL_CREAM | Freq: Every day | CUTANEOUS | 11 refills | Status: AC

## 2021-05-03 NOTE — Progress Notes (Signed)
Follow-Up Visit   Subjective  Matthew David is a 87 y.o. male who presents for the following: Annual Exam (History of SCC - TBSE today). The patient presents for Total-Body Skin Exam (TBSE) for skin cancer screening and mole check.  The following portions of the chart were reviewed this encounter and updated as appropriate:   Tobacco  Allergies  Meds  Problems  Med Hx  Surg Hx  Fam Hx     Review of Systems:  No other skin or systemic complaints except as noted in HPI or Assessment and Plan.  Objective  Well appearing patient in no apparent distress; mood and affect are within normal limits.  A full examination was performed including scalp, head, eyes, ears, nose, lips, neck, chest, axillae, abdomen, back, buttocks, bilateral upper extremities, bilateral lower extremities, hands, feet, fingers, toes, fingernails, and toenails. All findings within normal limits unless otherwise noted below.  Bilateral feet Nail dystrophy and scale  Scalp (20) Erythematous thin papules/macules with gritty scale.   Scalp (3) Erythematous keratotic or waxy stuck-on papule or plaque.    Assessment & Plan   Lentigines - Scattered tan macules - Due to sun exposure - Benign-appering, observe - Recommend daily broad spectrum sunscreen SPF 30+ to sun-exposed areas, reapply every 2 hours as needed. - Call for any changes  Seborrheic Keratoses - Stuck-on, waxy, tan-brown papules and/or plaques  - Benign-appearing - Discussed benign etiology and prognosis. - Observe - Call for any changes  Melanocytic Nevi - Tan-brown and/or pink-flesh-colored symmetric macules and papules - Benign appearing on exam today - Observation - Call clinic for new or changing moles - Recommend daily use of broad spectrum spf 30+ sunscreen to sun-exposed areas.   Hemangiomas - Red papules - Discussed benign nature - Observe - Call for any changes  Actinic Damage - Chronic condition, secondary to  cumulative UV/sun exposure - diffuse scaly erythematous macules with underlying dyspigmentation - Recommend daily broad spectrum sunscreen SPF 30+ to sun-exposed areas, reapply every 2 hours as needed.  - Staying in the shade or wearing long sleeves, sun glasses (UVA+UVB protection) and wide brim hats (4-inch brim around the entire circumference of the hat) are also recommended for sun protection.  - Call for new or changing lesions.  Skin cancer screening performed today.  Tinea pedis of both feet Bilateral feet Tinea pedis and unguium - chronic and persistent ketoconazole (NIZORAL) 2 % cream - Bilateral feet Apply 1 application topically at bedtime.  AK (actinic keratosis) (20) Scalp Destruction of lesion - Scalp Complexity: simple   Destruction method: cryotherapy   Informed consent: discussed and consent obtained   Timeout:  patient name, date of birth, surgical site, and procedure verified Lesion destroyed using liquid nitrogen: Yes   Region frozen until ice ball extended beyond lesion: Yes   Outcome: patient tolerated procedure well with no complications   Post-procedure details: wound care instructions given    Inflamed seborrheic keratosis x3 Scalp Destruction of lesion - Scalp Complexity: simple   Destruction method: cryotherapy   Informed consent: discussed and consent obtained   Timeout:  patient name, date of birth, surgical site, and procedure verified Lesion destroyed using liquid nitrogen: Yes   Region frozen until ice ball extended beyond lesion: Yes   Outcome: patient tolerated procedure well with no complications   Post-procedure details: wound care instructions given    Skin cancer screening  Return in about 6 months (around 10/31/2021) for AK follow up.  I, Ashok Cordia, CMA, am  acting as scribe for Sarina Ser, MD .  Documentation: I have reviewed the above documentation for accuracy and completeness, and I agree with the above.  Sarina Ser,  MD

## 2021-05-03 NOTE — Patient Instructions (Signed)

## 2021-05-07 ENCOUNTER — Encounter: Payer: Self-pay | Admitting: Dermatology

## 2021-09-07 ENCOUNTER — Emergency Department: Payer: Medicare Other

## 2021-09-07 ENCOUNTER — Emergency Department
Admission: EM | Admit: 2021-09-07 | Discharge: 2021-09-07 | Disposition: A | Discharge status: Home / Self Care | Payer: Medicare Other | Attending: Emergency Medicine | Admitting: Emergency Medicine

## 2021-09-07 ENCOUNTER — Other Ambulatory Visit: Payer: Self-pay

## 2021-09-07 DIAGNOSIS — Z85828 Personal history of other malignant neoplasm of skin: Secondary | ICD-10-CM | POA: Insufficient documentation

## 2021-09-07 DIAGNOSIS — Z8521 Personal history of malignant neoplasm of larynx: Secondary | ICD-10-CM | POA: Insufficient documentation

## 2021-09-07 DIAGNOSIS — Z7982 Long term (current) use of aspirin: Secondary | ICD-10-CM | POA: Insufficient documentation

## 2021-09-07 DIAGNOSIS — I10 Essential (primary) hypertension: Secondary | ICD-10-CM | POA: Insufficient documentation

## 2021-09-07 DIAGNOSIS — Z96651 Presence of right artificial knee joint: Secondary | ICD-10-CM | POA: Diagnosis not present

## 2021-09-07 DIAGNOSIS — Z79899 Other long term (current) drug therapy: Secondary | ICD-10-CM | POA: Insufficient documentation

## 2021-09-07 DIAGNOSIS — M25512 Pain in left shoulder: Secondary | ICD-10-CM | POA: Diagnosis present

## 2021-09-07 DIAGNOSIS — I2510 Atherosclerotic heart disease of native coronary artery without angina pectoris: Secondary | ICD-10-CM | POA: Diagnosis not present

## 2021-09-07 LAB — D-DIMER, QUANTITATIVE: D-Dimer, Quant: 0.64 ug/mL-FEU — ABNORMAL HIGH (ref 0.00–0.50)

## 2021-09-07 LAB — CBC WITH DIFFERENTIAL/PLATELET
Abs Immature Granulocytes: 0.02 10*3/uL (ref 0.00–0.07)
Basophils Absolute: 0.1 10*3/uL (ref 0.0–0.1)
Basophils Relative: 1 %
Eosinophils Absolute: 0.2 10*3/uL (ref 0.0–0.5)
Eosinophils Relative: 4 %
HCT: 35.1 % — ABNORMAL LOW (ref 39.0–52.0)
Hemoglobin: 11.6 g/dL — ABNORMAL LOW (ref 13.0–17.0)
Immature Granulocytes: 0 %
Lymphocytes Relative: 24 %
Lymphs Abs: 1.3 10*3/uL (ref 0.7–4.0)
MCH: 29.8 pg (ref 26.0–34.0)
MCHC: 33 g/dL (ref 30.0–36.0)
MCV: 90.2 fL (ref 80.0–100.0)
Monocytes Absolute: 0.5 10*3/uL (ref 0.1–1.0)
Monocytes Relative: 9 %
Neutro Abs: 3.3 10*3/uL (ref 1.7–7.7)
Neutrophils Relative %: 62 %
Platelets: 122 10*3/uL — ABNORMAL LOW (ref 150–400)
RBC: 3.89 MIL/uL — ABNORMAL LOW (ref 4.22–5.81)
RDW: 14.9 % (ref 11.5–15.5)
WBC: 5.4 10*3/uL (ref 4.0–10.5)
nRBC: 0 % (ref 0.0–0.2)

## 2021-09-07 LAB — COMPREHENSIVE METABOLIC PANEL
ALT: 12 U/L (ref 0–44)
AST: 16 U/L (ref 15–41)
Albumin: 3.7 g/dL (ref 3.5–5.0)
Alkaline Phosphatase: 47 U/L (ref 38–126)
Anion gap: 7 (ref 5–15)
BUN: 43 mg/dL — ABNORMAL HIGH (ref 8–23)
CO2: 25 mmol/L (ref 22–32)
Calcium: 8.9 mg/dL (ref 8.9–10.3)
Chloride: 109 mmol/L (ref 98–111)
Creatinine, Ser: 1.34 mg/dL — ABNORMAL HIGH (ref 0.61–1.24)
GFR, Estimated: 48 mL/min — ABNORMAL LOW (ref >60)
Glucose, Bld: 98 mg/dL (ref 70–99)
Potassium: 3.8 mmol/L (ref 3.5–5.1)
Sodium: 141 mmol/L (ref 135–145)
Total Bilirubin: 0.8 mg/dL (ref 0.3–1.2)
Total Protein: 6.2 g/dL — ABNORMAL LOW (ref 6.5–8.1)

## 2021-09-07 LAB — TROPONIN I (HIGH SENSITIVITY): Troponin I (High Sensitivity): <2 ng/L (ref <18)

## 2021-09-07 LAB — LIPASE, BLOOD: Lipase: 41 U/L (ref 11–51)

## 2021-09-07 MED ORDER — IOHEXOL 350 MG/ML SOLN
75.0000 mL | Freq: Once | INTRAVENOUS | Status: AC | PRN
  Administered 2021-09-07: 75 mL via INTRAVENOUS

## 2021-09-07 NOTE — ED Provider Notes (Signed)
Emergency department handoff note  Care of this patient was signed out to me at the end of the previous provider shift.  All pertinent patient information was conveyed and all questions were answered.  Patient pending CT PE that did not show any evidence of acute blood clots but did show evidence of old fracture in this left shoulder joint that is causing severe arthritis and likely the cause of this patient's pain.  The patient has been reexamined and is ready to be discharged.  All diagnostic results have been reviewed and discussed with the patient/family.  Care plan has been outlined and the patient/family understands all current diagnoses, results, and treatment plans.  There are no new complaints, changes, or physical findings at this time.  All questions have been addressed and answered.  Patient was instructed to, and agrees to follow-up with their primary care physician as well as return to the emergency department if any new or worsening symptoms develop.   Naaman Plummer, MD 09/07/21 [phone]

## 2021-09-07 NOTE — ED Notes (Signed)
Patient resting comfortably in stretcher at this time. NAD noted.

## 2021-09-07 NOTE — ED Notes (Signed)
Patient resting comfortably in stretcher. Awaiting transport back to facility.

## 2021-09-07 NOTE — ED Triage Notes (Signed)
Pt came from [facility] pt c/o left shoulder pain about an hour ago. Has had shoulder pain before said it was radiating earlier to the back of the shoulder scalpula. Pt c/o of no pain at this time. Pt VSS. HR 64 bp 110/82 98 on RA.

## 2021-09-07 NOTE — Discharge Instructions (Addendum)
Please use ibuprofen up to 600 mg every 8 hours, naproxen up to 500 mg every 12 hours, and/or Tylenol up to 4 g in a 24-hour period for any continued pain in the left shoulder

## 2021-09-07 NOTE — ED Notes (Signed)
[facility]   CALLED  FOR TRANSPORT  TO  HOME  PLACE OF  [place] 

## 2021-09-07 NOTE — ED Provider Notes (Addendum)
[facility] Provider Note    Event Date/Time   First MD Initiated Contact with Patient 09/07/21 [phone]     (approximate)   History   Shoulder Pain   HPI  Matthew David is a 88 y.o. male brought to the ED via EMS from home Place of [place] chief complaint of nontraumatic left shoulder pain.  Patient awoke approximately 2 hours ago with pain at the top of his left shoulder joint.  Currently denies pain.  Denies fall/injury/trauma.  Denies fever, cough, chest pain, shortness of breath, abdominal pain, nausea or vomiting.     Past Medical History   Past Medical History:  Diagnosis Date   Cancer [facility])    Larynx Cancer 03/2015, Rad tx's.   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   Benign prostatic hyperplasia with incomplete bladder emptying 06/04/2020   Syncope 12/03/2018   Dizziness 12/01/2018   History of total knee replacement, right 04/05/2017   Pain in limb 08/19/2016   Laryngeal cancer ([place]) 06/14/2016   Aortic atherosclerosis ([place]) 05/13/2016   Chest pain 05/07/2016   CAD (coronary artery disease) 05/07/2016   HLD (hyperlipidemia) 05/07/2016   HTN (hypertension) 05/07/2016   History of deep venous thrombosis (DVT) of distal vein of left lower extremity 01/22/2016   Left leg DVT ([place]) 08/13/2015   [place] 08/13/2015   Osteoarthritis of hip 02/07/2014   H/O cardiac catheterization 02/07/2014   GERD  (gastroesophageal reflux disease) 02/07/2014     Past Surgical History   Past Surgical History:  Procedure Laterality Date   APPENDECTOMY  1944   CATARACT EXTRACTION W/ INTRAOCULAR LENS  IMPLANT, BILATERAL Bilateral 2011   both eye done in the same year   [place]  2012   DIRECT LARYNGOSCOPY N/A 04/08/2015   Procedure: DIRECT LARYNGOSCOPY;  Surgeon: Clyde Canterbury, MD;  Location: [facility] ORS;  Service: ENT;  Laterality: N/A;   JOINT REPLACEMENT Right 2008   knee   KNEE ARTHROSCOPY Right 2006   SHOULDER SURGERY Left 2006   [place] Medications   Prior to Admission medications   Medication Sig Start Date End Date Taking? Authorizing Provider  acetaminophen (TYLENOL) 650 MG CR tablet Take 1,300 mg by mouth 2 (two) times daily.     [provider]  aspirin 81 MG tablet Take 81 mg by mouth daily after breakfast.    [provider]  benzonatate (TESSALON) 100 MG capsule Take 200 mg by mouth every 8 (eight) hours as needed. 05/11/20   [provider]  celecoxib (CELEBREX) 200 MG capsule Take 200 mg by mouth. Monday, Wednesday, Friday 10/22/18   [provider]  cetirizine (ZYRTEC) 10 MG tablet Take 10 mg by mouth daily.    [provider]  CRANBERRY CONCENTRATE PO Take 4,200  mg by mouth daily.     [provider]  fluticasone (FLONASE) 50 MCG/ACT nasal spray SPRAY TWICE INTO EACH NOSTRIL ONCE DAILY 12/30/19   [provider]  Guaifenesin (MUCINEX MAXIMUM STRENGTH) 1200 MG TB12 Take 1,200 mg by mouth 2 (two) times daily.    [provider]  ketoconazole (NIZORAL) 2 % cream Apply 1 application topically at bedtime. 05/03/21 04/23/23  Ralene Bathe, MD  Lifitegrast 5 % SOLN Apply to eye.    [provider]  LORazepam (ATIVAN) 1 MG tablet Take 1 mg by mouth at bedtime.    [provider]  lovastatin (MEVACOR) 20 MG tablet Take 20 mg by mouth at bedtime.     [provider]  Lysine 500 MG CAPS Take 650 mg by mouth.     [provider]  metoprolol tartrate (LOPRESSOR) 25 MG tablet Take 12.5 mg by mouth 2 (two) times daily.    [provider]  multivitamin-iron-minerals-folic acid (CENTRUM) chewable tablet Chew 1 tablet by mouth every morning.    [provider]  pantoprazole (PROTONIX) 40 MG tablet Take 40 mg by mouth 2 (two) times daily.    [provider]  Probiotic Product (ALIGN PO) Take 1 capsule by mouth every morning.    [provider]  Saw Palmetto-Phytosterols (PROSTATE SR PO) Take 1 tablet by mouth 2 (two) times daily.    [provider]  tamsulosin (FLOMAX) 0.4 MG CAPS capsule Take 0.4 mg by mouth daily after supper.     [provider]  traMADol (ULTRAM) 50 MG tablet Take 50 mg by mouth every 6 (six) hours as needed (knee pain).    [provider]  triamcinolone cream (KENALOG) 0.1 % Apply topically 2 (two) times daily as needed. 02/25/20   [provider]  valsartan (DIOVAN) 40 MG tablet Take 40 mg by mouth daily. 09/10/18   [provider]     Allergies  Gabapentin   Family History  History reviewed. No pertinent family history.   Physical Exam  Triage Vital Signs: ED Triage Vitals [09/07/21 0549]  Enc Vitals Group     BP (!) 136/56     Pulse Rate 68     Resp 19     Temp 98.4 F (36.9 C)     Temp Source Oral     SpO2 100 %     Weight 165 lb (74.8 kg)     Height 5\' 9"  (1.753 m)     Head Circumference      Peak Flow      Pain Score 2     Pain Loc      Pain Edu?      Excl. in [place]?     Updated Vital Signs: BP (!) 136/56 (BP Location: Right Arm)    Pulse 68    Temp 98.4 F (36.9 C) (Oral)    Resp 19    Ht 5\' 9"  (1.753 m)    Wt 74.8 kg    SpO2 100%    BMI 24.37 kg/m    General: Awake, no distress.  CV:  Good peripheral perfusion.  Resp:  Normal effort.  Abd:  No distention.  Other:  Left shoulder nontender to  palpation with full.  No swelling to suggest DVT.  2+ radial pulses.  Brisk, less than 5-second capillary refill.   ED Results / Procedures / Treatments  Labs (all labs ordered are listed, but only abnormal results are displayed) Labs Reviewed  CBC WITH DIFFERENTIAL/PLATELET - Abnormal; Notable for the following components:      Result Value   RBC 3.89 (*)    Hemoglobin 11.6 (*)    HCT 35.1 (*)    Platelets 122 (*)    All other components within normal limits  D-DIMER, QUANTITATIVE - Abnormal; Notable for the following components:   D-Dimer, Quant 0.64 (*)    All other components within normal limits  COMPREHENSIVE METABOLIC PANEL  LIPASE, BLOOD  TROPONIN I (HIGH SENSITIVITY)     EKG  ED ECG REPORT I, Jeremy Ditullio J, the attending physician, personally viewed and interpreted this ECG.   Date: 09/07/2021  EKG Time: 0602  Rate: 64  Rhythm: normal sinus rhythm  Axis: Normal  Intervals:none  ST&T Change: Nonspecific     RADIOLOGY I personally reviewed patient's chest x-ray and left shoulder x-ray as well as the radiology interpretation:  Chest x-ray:  Left shoulder x-ray:  Official radiology report(s): No results found.   PROCEDURES:  Critical Care performed: No  .1-3 Lead EKG Interpretation Performed by: Paulette Blanch, MD Authorized by: Paulette Blanch, MD     Interpretation: normal     ECG rate:  65   ECG rate assessment: normal     Rhythm: sinus rhythm     Ectopy: none     Conduction: normal   Comments:     Patient placed on cardiac monitor to evaluate for arrhythmias   MEDICATIONS ORDERED IN ED: Medications - No data to display   IMPRESSION / MDM / [place] / ED COURSE  I reviewed the triage vital signs and the nursing notes.                             88 year old male presenting with nontraumatic left shoulder pain.  Patient has a history of DVT; is not currently anticoagulated.  Will obtain cardiac panel, D-dimer, chest x-ray, left  shoulder x-ray.  Patient currently denies pain. I have personally reviewed patient's chart and note his office visit from 08/18/2021 for sinusitis.  The patient is on the cardiac monitor to evaluate for evidence of arrhythmia and/or significant heart rate changes.    Clinical Course as of 09/07/21 0707  Tue Sep 07, 2021  0656 Patient resting in no acute distress. Awaiting results of laboratory and imaging studies.  Care will be transferred to the oncoming provider Dr. Cheri Fowler at change of shift. Positive Ddimer; will obtain CTA Chest to evaluate for PE. [JS]    Clinical Course User Index [JS] Paulette Blanch, MD     FINAL CLINICAL IMPRESSION(S) / ED DIAGNOSES   Final diagnoses:  Acute pain of left shoulder     Rx / DC Orders   ED Discharge Orders     None        Note:  This document was prepared using Dragon voice recognition software and may include unintentional dictation errors.   Paulette Blanch, MD 09/07/21 4315    Paulette Blanch, MD 09/07/21 [phone]

## 2021-09-07 NOTE — ED Notes (Signed)
Patient given warm blanket. No needs expressed to RN. Denies any pain.

## 2021-10-06 ENCOUNTER — Other Ambulatory Visit (HOSPITAL_COMMUNITY): Payer: Self-pay | Admitting: Internal Medicine

## 2021-10-06 ENCOUNTER — Other Ambulatory Visit: Payer: Self-pay | Admitting: Internal Medicine

## 2021-10-06 DIAGNOSIS — W19XXXS Unspecified fall, sequela: Secondary | ICD-10-CM

## 2021-10-06 DIAGNOSIS — S0990XD Unspecified injury of head, subsequent encounter: Secondary | ICD-10-CM

## 2021-10-06 DIAGNOSIS — H539 Unspecified visual disturbance: Secondary | ICD-10-CM

## 2021-10-14 ENCOUNTER — Other Ambulatory Visit: Payer: Self-pay | Admitting: Neurology

## 2021-10-14 DIAGNOSIS — R413 Other amnesia: Secondary | ICD-10-CM

## 2021-10-16 ENCOUNTER — Emergency Department: Payer: Medicare Other

## 2021-10-16 ENCOUNTER — Emergency Department
Admission: EM | Admit: 2021-10-16 | Discharge: 2021-10-16 | Disposition: A | Discharge status: Home / Self Care | Payer: Medicare Other | Attending: Emergency Medicine | Admitting: Emergency Medicine

## 2021-10-16 ENCOUNTER — Other Ambulatory Visit: Payer: Self-pay

## 2021-10-16 DIAGNOSIS — I10 Essential (primary) hypertension: Secondary | ICD-10-CM | POA: Diagnosis not present

## 2021-10-16 DIAGNOSIS — I2510 Atherosclerotic heart disease of native coronary artery without angina pectoris: Secondary | ICD-10-CM | POA: Diagnosis not present

## 2021-10-16 DIAGNOSIS — M25512 Pain in left shoulder: Secondary | ICD-10-CM | POA: Diagnosis present

## 2021-10-16 DIAGNOSIS — R079 Chest pain, unspecified: Secondary | ICD-10-CM | POA: Diagnosis not present

## 2021-10-16 LAB — TROPONIN I (HIGH SENSITIVITY)
Troponin I (High Sensitivity): 12 ng/L (ref <18)
Troponin I (High Sensitivity): 10 ng/L (ref <18)

## 2021-10-16 LAB — BASIC METABOLIC PANEL
Anion gap: 10 (ref 5–15)
BUN: 23 mg/dL (ref 8–23)
CO2: 23 mmol/L (ref 22–32)
Calcium: 8.5 mg/dL — ABNORMAL LOW (ref 8.9–10.3)
Chloride: 106 mmol/L (ref 98–111)
Creatinine, Ser: 1.1 mg/dL (ref 0.61–1.24)
GFR, Estimated: >60 mL/min (ref >60)
Glucose, Bld: 98 mg/dL (ref 70–99)
Potassium: 3.5 mmol/L (ref 3.5–5.1)
Sodium: 139 mmol/L (ref 135–145)

## 2021-10-16 LAB — CBC
HCT: 35.8 % — ABNORMAL LOW (ref 39.0–52.0)
Hemoglobin: 11.5 g/dL — ABNORMAL LOW (ref 13.0–17.0)
MCH: 29.2 pg (ref 26.0–34.0)
MCHC: 32.1 g/dL (ref 30.0–36.0)
MCV: 90.9 fL (ref 80.0–100.0)
Platelets: 140 10*3/uL — ABNORMAL LOW (ref 150–400)
RBC: 3.94 MIL/uL — ABNORMAL LOW (ref 4.22–5.81)
RDW: 14.3 % (ref 11.5–15.5)
WBC: 5.7 10*3/uL (ref 4.0–10.5)
nRBC: 0 % (ref 0.0–0.2)

## 2021-10-16 MED ORDER — DICLOFENAC SODIUM 1 % EX GEL: 4.0000 g | Freq: Three times a day (TID) | CUTANEOUS | 0 refills | Status: AC | PRN

## 2021-10-16 NOTE — ED Notes (Addendum)
D/C and reasons to return to ED discussed with pt, pt vebalized understanding. Pt made aware of taxi cab ETA.   First nurse updated on plan for transportation and given appropriate paperwork for cab driver to fill out.

## 2021-10-16 NOTE — ED Notes (Signed)
Report to Janett Billow at home place, who states she will update POA.

## 2021-10-16 NOTE — ED Notes (Signed)
Pt presents to ED with c/o of L shoulder pain. Pt states no injury or trauma to this shoulder recently. Pt states it has bothered him in the past but nothing recent. Pt denies SOB or CP. Pt is A&Ox4. NAD noted at this time.

## 2021-10-16 NOTE — ED Triage Notes (Signed)
Pt comes into the ED via EMS from the home place of [place] for left shoulder pain for the past couple weeks, pt is concerned it is cardiac related. Pt is in NAD on arrival, respirations WNL, skin is warm and dry

## 2021-10-16 NOTE — ED Provider Notes (Signed)
[facility] Provider Note    Event Date/Time   First MD Initiated Contact with Patient 10/16/21 [phone]     (approximate)   History   Shoulder Pain and Chest Pain   HPI  Matthew David is a 88 y.o. male here with left-sided shoulder pain.  The patient states that he woke up this morning and noticed aching, positional, left shoulder pain.  He describes it as a fairly sharp pain along the left anterior shoulder.  It is worse with certain movements.  He states that he has a cardiac history so he wanted to be checked out, but also has had similar pain and been told it was his rotator cuff in the past.  However, he denies any known specific recent heavy lifting or injuries.  No numbness or weakness.  No tingling.  No cough or shortness of breath.     Physical Exam   Triage Vital Signs: ED Triage Vitals  Enc Vitals Group     BP 10/16/21 0834 (!) 147/54     Pulse Rate 10/16/21 0834 (!) 45     Resp 10/16/21 0834 16     Temp 10/16/21 0834 98 F (36.7 C)     Temp Source 10/16/21 0834 Oral     SpO2 10/16/21 0834 99 %     Weight 10/16/21 0834 155 lb (70.3 kg)     Height 10/16/21 0834 5\' 9"  (1.753 m)     Head Circumference --      Peak Flow --      Pain Score 10/16/21 0833 4     Pain Loc --      Pain Edu? --      Excl. in [place]? --     Most recent vital signs: Vitals:   10/16/21 0834 10/16/21 1039  BP: (!) 147/54 (!) 140/55  Pulse: (!) 45 (!) 52  Resp: 16 18  Temp: 98 F (36.7 C)   SpO2: 99% 99%     General: Awake, no distress.  CV:  Good peripheral perfusion.  No murmurs or rubs. Resp:  Normal effort.  Lungs clear to auscultation bilaterally.  No wheezes or rales. Abd:  No distention.  No tenderness. Other:  Mild tenderness over the left anterior glenohumeral joint, no skin changes.  No bruising.  Strength out of 5 distal bilateral upper extremities as well as normal sensation to light touch.   ED Results / Procedures / Treatments   Labs (all labs  ordered are listed, but only abnormal results are displayed) Labs Reviewed  BASIC METABOLIC PANEL - Abnormal; Notable for the following components:      Result Value   Calcium 8.5 (*)    All other components within normal limits  CBC - Abnormal; Notable for the following components:   RBC 3.94 (*)    Hemoglobin 11.5 (*)    HCT 35.8 (*)    Platelets 140 (*)    All other components within normal limits  TROPONIN I (HIGH SENSITIVITY)  TROPONIN I (HIGH SENSITIVITY)     EKG Sinus bradycardia, ventricular rate 54.  PR 152, QRS 90, QTc 411.  No acute ST elevation or depression.  No evidence of ischemia or infarct   RADIOLOGY CXR: Clear, no abnormalities   I also independently reviewed and agree wit radiologist interpretations.   PROCEDURES:  Critical Care performed: No  .1-3 Lead EKG Interpretation Performed by: Duffy Bruce, MD Authorized by: Duffy Bruce, MD     Interpretation: non-specific  ECG rate:  50-60   ECG rate assessment: bradycardic     Rhythm: sinus bradycardia     Ectopy: none     Conduction: normal   Comments:     Indication: Shoulder pain, cardiac history    MEDICATIONS ORDERED IN ED: Medications - No data to display   IMPRESSION / MDM / [place] / ED COURSE  I reviewed the triage vital signs and the nursing notes.                               The patient is on the cardiac monitor to evaluate for evidence of arrhythmia and/or significant heart rate changes.   Ddx:  Musculoskeletal shoulder pain, atypical chest pain, ACS, pneumonia, unlikely PE or dissection, cervical radiculopathy, gout (h/o same)   MDM:  88 year old well-appearing male with history of hypertension, hyperlipidemia, coronary disease, degenerative disc disease, history of prior provoked DVT, here with left shoulder pain.  The pain is positional, reproducible, and localizes to the left glenohumeral joint.  EKG is nonischemic and troponins are negative x2, do  not suspect referred cardiac pain.  Chest x-ray is clear and he has no cough, shortness of breath, satting 99% on room air, and pain does not involve the lung fields, do not suspect referred pain from pneumonia, PE, or other pulmonary source.  He has a history of similar issues in the past.  He also has a history of cervical radiculopathy, so this could be contributing.  Distal neurovascular is fully intact.  No warmth swelling or signs to suggest DVT.  Given the reproducible nature of his symptoms with history of similar issues, I think it is reasonable to treat with topical analgesia given his age, discharged with good return precautions.  Patient is in agreement this plan.   MEDICATIONS GIVEN IN ED: Medications - No data to display   Consults:  None.   EMR reviewed  Prior neurology notes with Dr. Manuella Ghazi, including consult 09/2021 for dementia, hypertension related PCP visit on 2/14 with Dr. Caryl Comes     FINAL CLINICAL IMPRESSION(S) / ED DIAGNOSES   Final diagnoses:  Acute pain of left shoulder     Rx / DC Orders   ED Discharge Orders          Ordered    diclofenac Sodium (VOLTAREN) 1 % GEL  3 times daily PRN        10/16/21 1202             Note:  This document was prepared using Dragon voice recognition software and may include unintentional dictation errors.   Duffy Bruce, MD 10/16/21 [phone]

## 2021-10-16 NOTE — ED Notes (Signed)
Pt will be provided with a cab voucher back to home place due to multiple attempts of not being able to find a ride himself, charge RN approves this. This RN will call [facility] for transport and they state they will come between 30-45 minutes.

## 2021-10-16 NOTE — ED Notes (Signed)
Pt calling for ride at this time.

## 2021-10-21 ENCOUNTER — Other Ambulatory Visit: Payer: Self-pay

## 2021-10-21 ENCOUNTER — Ambulatory Visit
Admission: RE | Admit: 2021-10-21 | Discharge: 2021-10-21 | Disposition: A | Discharge status: Home / Self Care | Payer: Medicare Other | Source: Ambulatory Visit | Attending: Neurology | Admitting: Neurology

## 2021-10-21 DIAGNOSIS — R413 Other amnesia: Secondary | ICD-10-CM | POA: Insufficient documentation

## 2021-11-01 ENCOUNTER — Ambulatory Visit: Payer: Medicare Other | Admitting: Dermatology

## 2021-12-01 ENCOUNTER — Ambulatory Visit: Payer: Medicare Other | Admitting: Dermatology

## 2021-12-13 ENCOUNTER — Ambulatory Visit: Payer: Medicare Other | Admitting: Dermatology

## 2022-10-02 ENCOUNTER — Emergency Department: Payer: Medicare Other

## 2022-10-02 ENCOUNTER — Emergency Department
Admission: EM | Admit: 2022-10-02 | Discharge: 2022-10-02 | Disposition: A | Discharge status: Home / Self Care | Payer: Medicare Other | Attending: Emergency Medicine | Admitting: Emergency Medicine

## 2022-10-02 DIAGNOSIS — R058 Other specified cough: Secondary | ICD-10-CM | POA: Insufficient documentation

## 2022-10-02 DIAGNOSIS — Z8521 Personal history of malignant neoplasm of larynx: Secondary | ICD-10-CM | POA: Diagnosis not present

## 2022-10-02 DIAGNOSIS — I10 Essential (primary) hypertension: Secondary | ICD-10-CM | POA: Insufficient documentation

## 2022-10-02 DIAGNOSIS — Z1152 Encounter for screening for COVID-19: Secondary | ICD-10-CM | POA: Insufficient documentation

## 2022-10-02 DIAGNOSIS — D61818 Other pancytopenia: Secondary | ICD-10-CM | POA: Insufficient documentation

## 2022-10-02 DIAGNOSIS — R059 Cough, unspecified: Secondary | ICD-10-CM | POA: Diagnosis present

## 2022-10-02 DIAGNOSIS — R531 Weakness: Secondary | ICD-10-CM | POA: Diagnosis not present

## 2022-10-02 LAB — CBC WITH DIFFERENTIAL/PLATELET
Abs Immature Granulocytes: 0.03 10*3/uL (ref 0.00–0.07)
Basophils Absolute: 0.1 10*3/uL (ref 0.0–0.1)
Basophils Relative: 1 %
Eosinophils Absolute: 0.1 10*3/uL (ref 0.0–0.5)
Eosinophils Relative: 1 %
HCT: 40.3 % (ref 39.0–52.0)
Hemoglobin: 13.1 g/dL (ref 13.0–17.0)
Immature Granulocytes: 0 %
Lymphocytes Relative: 11 %
Lymphs Abs: 1.1 10*3/uL (ref 0.7–4.0)
MCH: 28.9 pg (ref 26.0–34.0)
MCHC: 32.5 g/dL (ref 30.0–36.0)
MCV: 89 fL (ref 80.0–100.0)
Monocytes Absolute: 0.9 10*3/uL (ref 0.1–1.0)
Monocytes Relative: 9 %
Neutro Abs: 7.8 10*3/uL — ABNORMAL HIGH (ref 1.7–7.7)
Neutrophils Relative %: 78 %
Platelets: 103 10*3/uL — ABNORMAL LOW (ref 150–400)
RBC: 4.53 MIL/uL (ref 4.22–5.81)
RDW: 13.9 % (ref 11.5–15.5)
WBC: 9.9 10*3/uL (ref 4.0–10.5)
nRBC: 0 % (ref 0.0–0.2)

## 2022-10-02 LAB — COMPREHENSIVE METABOLIC PANEL
ALT: 16 U/L (ref 0–44)
AST: 25 U/L (ref 15–41)
Albumin: 3.8 g/dL (ref 3.5–5.0)
Alkaline Phosphatase: 62 U/L (ref 38–126)
Anion gap: 10 (ref 5–15)
BUN: 28 mg/dL — ABNORMAL HIGH (ref 8–23)
CO2: 25 mmol/L (ref 22–32)
Calcium: 8.8 mg/dL — ABNORMAL LOW (ref 8.9–10.3)
Chloride: 106 mmol/L (ref 98–111)
Creatinine, Ser: 1.17 mg/dL (ref 0.61–1.24)
GFR, Estimated: 56 mL/min — ABNORMAL LOW (ref >60)
Glucose, Bld: 115 mg/dL — ABNORMAL HIGH (ref 70–99)
Potassium: 3.4 mmol/L — ABNORMAL LOW (ref 3.5–5.1)
Sodium: 141 mmol/L (ref 135–145)
Total Bilirubin: 1.1 mg/dL (ref 0.3–1.2)
Total Protein: 6.8 g/dL (ref 6.5–8.1)

## 2022-10-02 LAB — TROPONIN I (HIGH SENSITIVITY): Troponin I (High Sensitivity): 11 ng/L (ref <18)

## 2022-10-02 LAB — URINALYSIS, ROUTINE W REFLEX MICROSCOPIC
Bilirubin Urine: NEGATIVE
Glucose, UA: NEGATIVE mg/dL
Hgb urine dipstick: NEGATIVE
Ketones, ur: 20 mg/dL — AB
Leukocytes,Ua: NEGATIVE
Nitrite: NEGATIVE
Protein, ur: NEGATIVE mg/dL
Specific Gravity, Urine: >1.046 — ABNORMAL HIGH (ref 1.005–1.030)
pH: 6 (ref 5.0–8.0)

## 2022-10-02 LAB — RESP PANEL BY RT-PCR (RSV, FLU A&B, COVID)  RVPGX2
Influenza A by PCR: NEGATIVE
Influenza B by PCR: NEGATIVE
Resp Syncytial Virus by PCR: NEGATIVE
SARS Coronavirus 2 by RT PCR: NEGATIVE

## 2022-10-02 LAB — LIPASE, BLOOD: Lipase: 32 U/L (ref 11–51)

## 2022-10-02 MED ORDER — ACETAMINOPHEN 500 MG PO TABS
1000.0000 mg | ORAL_TABLET | Freq: Once | ORAL | Status: AC
  Administered 2022-10-02: 1000 mg via ORAL
  Filled 2022-10-02: qty 2

## 2022-10-02 MED ORDER — IOHEXOL 300 MG/ML  SOLN
100.0000 mL | Freq: Once | INTRAMUSCULAR | Status: AC | PRN
  Administered 2022-10-02: 100 mL via INTRAVENOUS

## 2022-10-02 MED ORDER — LACTATED RINGERS IV BOLUS
1000.0000 mL | Freq: Once | INTRAVENOUS | Status: AC
  Administered 2022-10-02: 1000 mL via INTRAVENOUS

## 2022-10-02 NOTE — ED Provider Notes (Signed)
[facility] Provider Note    Event Date/Time   First MD Initiated Contact with Patient 10/02/22 1042     (approximate)   History   Weakness   HPI  Matthew David is a 89 y.o. male past medical history of laryngeal cancer status post radiation, coronary artery status post tenting, hypertension, DVT not on blood thinner who presents because of cough increase p.o. production generalized weakness.  Patient comes from his facility due to generalized weakness.  Facility notes that typically he is up and active since yesterday has not been as active and has been laying in bed.  Patient tells me he is sick.  Has some facial pain no significant headache vision change numbness weakness.  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  He is not sure if he is vomited but does feel it has been spitting up some.  No diarrhea.  Last bowel movement was yesterday.  Does endorse some mild periumbilical abdominal pain.  Denies chest pain.  Patient was tested for COVID at the facility and this was negative.  Still eating and drinking okay.  No urinary symptoms.     Past Medical History:  Diagnosis Date   Cancer [facility])    Larynx Cancer 03/2015, Rad tx's.   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   Benign prostatic hyperplasia with incomplete bladder emptying 06/04/2020   Syncope 12/03/2018   Dizziness 12/01/2018    History of total knee replacement, right 04/05/2017   Pain in limb 08/19/2016   Laryngeal cancer ([place]) 06/14/2016   Aortic atherosclerosis ([place]) 05/13/2016   Chest pain 05/07/2016   CAD (coronary artery disease) 05/07/2016   HLD (hyperlipidemia) 05/07/2016   HTN (hypertension) 05/07/2016   History of deep venous thrombosis (DVT) of distal vein of left lower extremity 01/22/2016   Left leg DVT ([place]) 08/13/2015   Tetany 08/13/2015   Osteoarthritis of hip 02/07/2014   H/O cardiac catheterization 02/07/2014   GERD (gastroesophageal reflux disease) 02/07/2014     Physical Exam  Triage Vital Signs: ED Triage Vitals  Enc Vitals Group     BP 10/02/22 1040 (!) 168/89     Pulse Rate 10/02/22 1038 94     Resp 10/02/22 1038 15     Temp --      Temp src --      SpO2 10/02/22 1038 99 %     Weight --      Height --      Head Circumference --      Peak Flow --      Pain Score 10/02/22 1038 0     Pain Loc --      Pain Edu? --      Excl. in [place]? --     Most recent vital signs: Vitals:  10/02/22 1040 10/02/22 1115  BP: (!) 168/89   Pulse:    Resp:    Temp:  99.1 F (37.3 C)  SpO2:       General: Awake, no distress.  Patient is very hard of hearing CV:  Good peripheral perfusion.  No peripheral edema Resp:  Normal effort.  Frequent wet sounding cough but clear lungs no increased work of breathing Abd:  No distention.  Abdomen is soft, minimal periumbilical tenderness but no guarding Neuro:             Awake, Alert, Oriented x 3  Other:  Mucous membranes are dry   ED Results / Procedures / Treatments  Labs (all labs ordered are listed, but only abnormal results are displayed) Labs Reviewed  COMPREHENSIVE METABOLIC PANEL - Abnormal; Notable for the following components:      Result Value   Potassium 3.4 (*)    Glucose, Bld 115 (*)    BUN 28 (*)    Calcium 8.8 (*)    GFR, Estimated 56 (*)    All other components within normal limits  CBC WITH DIFFERENTIAL/PLATELET -  Abnormal; Notable for the following components:   Platelets 103 (*)    Neutro Abs 7.8 (*)    All other components within normal limits  URINALYSIS, ROUTINE W REFLEX MICROSCOPIC - Abnormal; Notable for the following components:   Color, Urine YELLOW (*)    APPearance CLEAR (*)    Specific Gravity, Urine >1.046 (*)    Ketones, ur 20 (*)    All other components within normal limits  RESP PANEL BY RT-PCR (RSV, FLU A&B, COVID)  RVPGX2  LIPASE, BLOOD  TROPONIN I (HIGH SENSITIVITY)     EKG EKG reviewed interpreted myself shows sinus rhythm, normal axis, normal intervals no acute ischemic changes    RADIOLOGY I reviewed and interpreted the CXR which does not show any acute cardiopulmonary process    PROCEDURES:  Critical Care performed: No  Procedures  The patient is on the cardiac monitor to evaluate for evidence of arrhythmia and/or significant heart rate changes.   MEDICATIONS ORDERED IN ED: Medications  lactated ringers bolus 1,000 mL (0 mLs Intravenous Stopped 10/02/22 1416)  acetaminophen (TYLENOL) tablet 1,000 mg (1,000 mg Oral Given 10/02/22 1047)  iohexol (OMNIPAQUE) 300 MG/ML solution 100 mL (100 mLs Intravenous Contrast Given 10/02/22 1154)     IMPRESSION / MDM / ASSESSMENT AND PLAN / ED COURSE  I reviewed the triage vital signs and the nursing notes.                              Patient's presentation is most consistent with acute complicated illness / injury requiring diagnostic workup.  Differential diagnosis includes, but is not limited to, viral illness including COVID-19, influenza, pneumonia, sepsis, UTI, gastroenteritis, intra-abdominal infection, electrode abnormality, duration, AKI  The patient is a 89 year old male who presents with weakness cough body aches increased sputum production.  This has been going on since yesterday.  His facility noticed that he was less active than normal typically gets around walking but is just been in bed over the last  day.  He tells me he has had a productive cough with some spitting up but no frank vomiting.  Generally just feels sick but repeatedly says" its not bad".  He does not endorse some vague abdominal cramping when asked specifically but abdominal exam is benign.  Denies urinary symptoms fevers dyspnea or  chest pain.  Patient is mildly hypertensive vitals otherwise reassuring.  On exam he does look somewhat dry abdominal exam benign lungs are clear does have a wet cough.  Will obtain screening labs chest x-ray EKG urinalysis and COVID flu testing.  Will give bolus of fluid.  Patient's labs overall reassuring no significant leukocytosis CMP and lipase normal.  COVID and flu testing negative.  On reassessment patient says he just chills not feeling well has now he was having chest pain prior to arrival though he told me he was not initially.  I will add on a troponin.  Does still endorse some abdominal pain as well we will get a CT of the abdomen pelvis given his age.    Troponin is negative.  CT of the abdomen pelvis is negative.  Patient's urine is not consistent with infection COVID and flu test negative.  Patient mentating normally and tolerating p.o.  My suspicion is this is likely a viral illness.  Have not identified any obvious life-threatening etiology of his symptoms or indication for admission.  Discussed findings with him.  He would like to go back to his facility.  Discussed reasons to return to ED.  FINAL CLINICAL IMPRESSION(S) / ED DIAGNOSES   Final diagnoses:  Cough, unspecified type  Weakness     Rx / DC Orders   ED Discharge Orders     None        Note:  This document was prepared using Dragon voice recognition software and may include unintentional dictation errors.   Rada Hay, MD 10/02/22 1435

## 2022-10-02 NOTE — ED Notes (Signed)
[facility] called to tranport patient back to [facility]

## 2022-10-02 NOTE — Discharge Instructions (Signed)
Your blood work urinalysis and COVID and flu test were all reassuring.  We also obtained a CT of the abdomen pelvis which did not have any acute findings.  We suspect the likely have a viral illness.  Please make sure you are staying hydrated.  If any your symptoms are worsening you develop new symptoms that are concerning to you please return to the emergency department.

## 2022-10-02 NOTE — ED Triage Notes (Signed)
Pt brought in from [facility], pt co of weakness x several days. Pt denies n/v, diarrhea, pain.

## 2022-11-23 ENCOUNTER — Other Ambulatory Visit: Payer: Self-pay

## 2022-11-23 ENCOUNTER — Emergency Department
Admission: EM | Admit: 2022-11-23 | Discharge: 2022-11-23 | Disposition: A | Discharge status: Home / Self Care | Payer: Medicare Other | Attending: Emergency Medicine | Admitting: Emergency Medicine

## 2022-11-23 ENCOUNTER — Emergency Department: Payer: Medicare Other

## 2022-11-23 DIAGNOSIS — S0003XA Contusion of scalp, initial encounter: Secondary | ICD-10-CM | POA: Diagnosis not present

## 2022-11-23 DIAGNOSIS — S0990XA Unspecified injury of head, initial encounter: Secondary | ICD-10-CM | POA: Diagnosis present

## 2022-11-23 DIAGNOSIS — S80211A Abrasion, right knee, initial encounter: Secondary | ICD-10-CM | POA: Insufficient documentation

## 2022-11-23 DIAGNOSIS — I10 Essential (primary) hypertension: Secondary | ICD-10-CM | POA: Insufficient documentation

## 2022-11-23 DIAGNOSIS — Y92531 Health care provider office as the place of occurrence of the external cause: Secondary | ICD-10-CM | POA: Insufficient documentation

## 2022-11-23 DIAGNOSIS — I2510 Atherosclerotic heart disease of native coronary artery without angina pectoris: Secondary | ICD-10-CM | POA: Diagnosis not present

## 2022-11-23 DIAGNOSIS — W01198A Fall on same level from slipping, tripping and stumbling with subsequent striking against other object, initial encounter: Secondary | ICD-10-CM | POA: Insufficient documentation

## 2022-11-23 DIAGNOSIS — W19XXXA Unspecified fall, initial encounter: Secondary | ICD-10-CM

## 2022-11-23 LAB — BASIC METABOLIC PANEL
Anion gap: 6 (ref 5–15)
BUN: 27 mg/dL — ABNORMAL HIGH (ref 8–23)
CO2: 23 mmol/L (ref 22–32)
Calcium: 8.8 mg/dL — ABNORMAL LOW (ref 8.9–10.3)
Chloride: 110 mmol/L (ref 98–111)
Creatinine, Ser: 1.17 mg/dL (ref 0.61–1.24)
GFR, Estimated: 56 mL/min — ABNORMAL LOW (ref >60)
Glucose, Bld: 105 mg/dL — ABNORMAL HIGH (ref 70–99)
Potassium: 3.7 mmol/L (ref 3.5–5.1)
Sodium: 139 mmol/L (ref 135–145)

## 2022-11-23 LAB — CBC
HCT: 41.6 % (ref 39.0–52.0)
Hemoglobin: 13.3 g/dL (ref 13.0–17.0)
MCH: 29.5 pg (ref 26.0–34.0)
MCHC: 32 g/dL (ref 30.0–36.0)
MCV: 92.2 fL (ref 80.0–100.0)
Platelets: 118 10*3/uL — ABNORMAL LOW (ref 150–400)
RBC: 4.51 MIL/uL (ref 4.22–5.81)
RDW: 13.8 % (ref 11.5–15.5)
WBC: 8 10*3/uL (ref 4.0–10.5)
nRBC: 0 % (ref 0.0–0.2)

## 2022-11-23 NOTE — ED Provider Notes (Signed)
[facility] Provider Note    Event Date/Time   First MD Initiated Contact with Patient 11/23/22 1856     (approximate)   History   Chief Complaint Fall   HPI  Matthew David is a 89 y.o. male with past medical history of hypertension, CAD, and DVT who presents to the ED following fall.  Patient reports that he lost his balance and fell while leaving his doctor's office earlier today.  Patient admits to hitting his head but denies losing consciousness, states he also struck his right knee.  He denies significant headache, neck pain, or knee pain at this time, denies taking a blood thinner.  He states he has been otherwise feeling well, denies any pain in his chest or abdomen.     Physical Exam   Triage Vital Signs: ED Triage Vitals  Enc Vitals Group     BP 11/23/22 1638 (!) 187/73     Pulse Rate 11/23/22 1638 (!) 55     Resp 11/23/22 1638 16     Temp 11/23/22 1638 97.6 F (36.4 C)     Temp src --      SpO2 11/23/22 1638 95 %     Weight 11/23/22 1639 170 lb (77.1 kg)     Height 11/23/22 1639 5\' 10"  (1.778 m)     Head Circumference --      Peak Flow --      Pain Score 11/23/22 1638 0     Pain Loc --      Pain Edu? --      Excl. in [place]? --     Most recent vital signs: Vitals:   11/23/22 1638  BP: (!) 187/73  Pulse: (!) 55  Resp: 16  Temp: 97.6 F (36.4 C)  SpO2: 95%    Constitutional: Alert and oriented. Eyes: Conjunctivae are normal. Head: Left frontal scalp abrasion and small hematoma with no laceration or step-off. Nose: No congestion/rhinnorhea. Mouth/Throat: Mucous membranes are moist.  Neck: No midline cervical spine tenderness to palpation. Cardiovascular: Normal rate, regular rhythm. Grossly normal heart sounds.  2+ radial pulses bilaterally. Respiratory: Normal respiratory effort.  No retractions. Lungs CTAB.  No chest wall tenderness to palpation. Gastrointestinal: Soft and nontender. No distention. Musculoskeletal: Abrasion  to right knee with no lower extremity tenderness nor edema.  No upper extremity bony tenderness to palpation. Neurologic:  Normal speech and language. No gross focal neurologic deficits are appreciated.    ED Results / Procedures / Treatments   Labs (all labs ordered are listed, but only abnormal results are displayed) Labs Reviewed  CBC - Abnormal; Notable for the following components:      Result Value   Platelets 118 (*)    All other components within normal limits  BASIC METABOLIC PANEL - Abnormal; Notable for the following components:   Glucose, Bld 105 (*)    BUN 27 (*)    Calcium 8.8 (*)    GFR, Estimated 56 (*)    All other components within normal limits     EKG  ED ECG REPORT I, Blake Divine, the attending physician, personally viewed and interpreted this ECG.   Date: 11/23/2022  EKG Time: 16:45  Rate: 55  Rhythm: sinus bradycardia  Axis: Normal  Intervals:none  ST&T Change: None  RADIOLOGY CT head reviewed and interpreted by me with no hemorrhage or midline shift.  PROCEDURES:  Critical Care performed: No  Procedures   MEDICATIONS ORDERED IN ED: Medications - No data to  display   IMPRESSION / MDM / ASSESSMENT AND PLAN / ED COURSE  I reviewed the triage vital signs and the nursing notes.                              89 y.o. male with past medical history of hypertension, CAD, and DVT who presents to the ED after losing his balance and falling earlier this afternoon, striking his head but not losing consciousness.  Patient's presentation is most consistent with acute presentation with potential threat to life or bodily function.  Differential diagnosis includes, but is not limited to, intracranial injury, skull fracture, cervical spine injury, extremity injury.  Patient well-appearing and in no acute distress, vital signs are unremarkable.  He has abrasion and small hematoma to his forehead but no laceration or step-off.  CT head and cervical  spine are negative for acute traumatic injury.  He has an abrasion to his right knee but is able to flex and extend the knee without any pain whatsoever.  No evidence of traumatic injury to his trunk or other extremities.  EKG shows no evidence of arrhythmia or ischemia and labs are also reassuring with no significant anemia, leukocytosis, electrolyte abnormality, or AKI.  Patient is appropriate for discharge home with PCP follow-up, was counseled to return to the ED for new or worsening symptoms.  Patient agrees with plan.      FINAL CLINICAL IMPRESSION(S) / ED DIAGNOSES   Final diagnoses:  Fall, initial encounter  Injury of head, initial encounter  Abrasion of right knee, initial encounter     Rx / DC Orders   ED Discharge Orders     None        Note:  This document was prepared using Dragon voice recognition software and may include unintentional dictation errors.   Blake Divine, MD 11/23/22 2017

## 2022-11-23 NOTE — ED Notes (Signed)
Attempted to call report x 5 to [facility]. There was no answer at available numer ([phone]) Voice messages left x 2 and charge RN aware. Attempted to call next of kin, son, Jenny Reichmann and left message for him stating that pt was discharged back to facility and multiple unsuccessful attempts were made to give report.

## 2022-11-23 NOTE — ED Notes (Signed)
Called [facility] for transport back to [facility] on [address]

## 2022-11-23 NOTE — ED Triage Notes (Signed)
Pt to ED via [facility] from [facility]. Pt had mechanical fall. VSS. No LOC.

## 2022-11-23 NOTE — ED Notes (Signed)
Dr Jessup at bedside 

## 2022-11-23 NOTE — ED Triage Notes (Signed)
Pt to ED [facility] from doctors office, pt fell going out the door. Abrasions noted to forehead. Pt states lives at nursing facility, cannot remember which one "[facility]". Pt denies pain. Pt answering some orientation questions appropriately.  HOH  Pt concerned family does not know he is here, this RN notified son of visit via phone.

## 2022-11-23 NOTE — ED Notes (Signed)
Pt resides at [facility] and requested this RN call. This RN spoke with Addy, RN for report.

## 2022-12-26 ENCOUNTER — Emergency Department
Admission: EM | Admit: 2022-12-26 | Discharge: 2022-12-26 | Disposition: A | Discharge status: Home / Self Care | Payer: Medicare Other | Attending: Emergency Medicine | Admitting: Emergency Medicine

## 2022-12-26 ENCOUNTER — Emergency Department: Payer: Medicare Other

## 2022-12-26 ENCOUNTER — Other Ambulatory Visit: Payer: Self-pay

## 2022-12-26 DIAGNOSIS — Z8521 Personal history of malignant neoplasm of larynx: Secondary | ICD-10-CM | POA: Insufficient documentation

## 2022-12-26 DIAGNOSIS — Z85828 Personal history of other malignant neoplasm of skin: Secondary | ICD-10-CM | POA: Insufficient documentation

## 2022-12-26 DIAGNOSIS — I10 Essential (primary) hypertension: Secondary | ICD-10-CM | POA: Insufficient documentation

## 2022-12-26 DIAGNOSIS — Z96651 Presence of right artificial knee joint: Secondary | ICD-10-CM | POA: Diagnosis not present

## 2022-12-26 DIAGNOSIS — R1010 Upper abdominal pain, unspecified: Secondary | ICD-10-CM | POA: Insufficient documentation

## 2022-12-26 DIAGNOSIS — R079 Chest pain, unspecified: Secondary | ICD-10-CM | POA: Diagnosis present

## 2022-12-26 DIAGNOSIS — R110 Nausea: Secondary | ICD-10-CM | POA: Insufficient documentation

## 2022-12-26 DIAGNOSIS — M25512 Pain in left shoulder: Secondary | ICD-10-CM | POA: Diagnosis not present

## 2022-12-26 DIAGNOSIS — I2510 Atherosclerotic heart disease of native coronary artery without angina pectoris: Secondary | ICD-10-CM | POA: Diagnosis not present

## 2022-12-26 LAB — COMPREHENSIVE METABOLIC PANEL
ALT: 12 U/L (ref 0–44)
AST: 18 U/L (ref 15–41)
Albumin: 3.6 g/dL (ref 3.5–5.0)
Alkaline Phosphatase: 59 U/L (ref 38–126)
Anion gap: 6 (ref 5–15)
BUN: 31 mg/dL — ABNORMAL HIGH (ref 8–23)
CO2: 25 mmol/L (ref 22–32)
Calcium: 8.7 mg/dL — ABNORMAL LOW (ref 8.9–10.3)
Chloride: 110 mmol/L (ref 98–111)
Creatinine, Ser: 1.18 mg/dL (ref 0.61–1.24)
GFR, Estimated: 56 mL/min — ABNORMAL LOW (ref >60)
Glucose, Bld: 99 mg/dL (ref 70–99)
Potassium: 3.8 mmol/L (ref 3.5–5.1)
Sodium: 141 mmol/L (ref 135–145)
Total Bilirubin: 1 mg/dL (ref 0.3–1.2)
Total Protein: 6 g/dL — ABNORMAL LOW (ref 6.5–8.1)

## 2022-12-26 LAB — TROPONIN I (HIGH SENSITIVITY)
Troponin I (High Sensitivity): 16 ng/L (ref <18)
Troponin I (High Sensitivity): 12 ng/L (ref <18)

## 2022-12-26 LAB — CBC WITH DIFFERENTIAL/PLATELET
Abs Immature Granulocytes: 0.01 10*3/uL (ref 0.00–0.07)
Basophils Absolute: 0.1 10*3/uL (ref 0.0–0.1)
Basophils Relative: 1 %
Eosinophils Absolute: 0.1 10*3/uL (ref 0.0–0.5)
Eosinophils Relative: 2 %
HCT: 39.4 % (ref 39.0–52.0)
Hemoglobin: 12.8 g/dL — ABNORMAL LOW (ref 13.0–17.0)
Immature Granulocytes: 0 %
Lymphocytes Relative: 29 %
Lymphs Abs: 1.7 10*3/uL (ref 0.7–4.0)
MCH: 29.4 pg (ref 26.0–34.0)
MCHC: 32.5 g/dL (ref 30.0–36.0)
MCV: 90.6 fL (ref 80.0–100.0)
Monocytes Absolute: 0.6 10*3/uL (ref 0.1–1.0)
Monocytes Relative: 10 %
Neutro Abs: 3.6 10*3/uL (ref 1.7–7.7)
Neutrophils Relative %: 58 %
Platelets: 130 10*3/uL — ABNORMAL LOW (ref 150–400)
RBC: 4.35 MIL/uL (ref 4.22–5.81)
RDW: 13.6 % (ref 11.5–15.5)
WBC: 6.1 10*3/uL (ref 4.0–10.5)
nRBC: 0 % (ref 0.0–0.2)

## 2022-12-26 LAB — LIPASE, BLOOD: Lipase: 36 U/L (ref 11–51)

## 2022-12-26 NOTE — ED Notes (Signed)
Pt daughter in law updated on status and plan of care, no further questions to this RN

## 2022-12-26 NOTE — ED Notes (Signed)
Pt taken to ED lobby via WC by this RN. Pt assisted into [facility] transport van by this Psychologist, sport and exercise.

## 2022-12-26 NOTE — ED Notes (Signed)
Report given to [facility], no further questions to this RN.

## 2022-12-26 NOTE — ED Triage Notes (Addendum)
Pt arrives from [facility] with c/o L shoulder pain and chest pain. Pt describes as an ache, 7/10, that woke him up from sleep.   No injuries report, VSS, NAD

## 2022-12-26 NOTE — ED Notes (Signed)
Per Shavoen at [facility], they have transport today and will pick pt up around 1415. Call back number provided.

## 2022-12-26 NOTE — ED Notes (Signed)
Pt provided with crackers and ice water per request, no further needs at this time.  VSS, pt denies pain. NAD. WCTM. Call light in reach.

## 2022-12-26 NOTE — ED Provider Notes (Signed)
[facility] Provider Note    Event Date/Time   First MD Initiated Contact with Patient 12/26/22 [id number]     (approximate)   History   Chest Pain and Shoulder Pain   HPI  Matthew David is a 89 y.o. male with past medical history of laryngeal cancer status post radiation, CAD, hypertension, DVT who presents with chest pain and shoulder pain.  Patient tells me that his pain woke him up from sleep today.  He describes pain in the left shoulder and left chest difficulty describing the quality of the pain.  Says it lasted for about 30 minutes.  Says he did not ambulate during episodes unclear if it was exertional he had some associated nausea but no diaphoresis or dyspnea.  Tells me he does not have any pain currently.  Then later tells me he has some pain in the upper abdomen lower chest but it has more discomfort but not significant pain like before.  Denies pain in the legs or lower extremity swelling.  Does have history of left shoulder surgery but tells me does not typically get pain in the shoulder.  Does have history of coronary artery disease     Past Medical History:  Diagnosis Date   Cancer (HCC)    Larynx Cancer 03/2015, Rad tx's.   DVT (deep venous thrombosis) (HCC)    Hx of appendectomy    Hx of squamous cell carcinoma 2015   multiple sites    Hypertension    Squamous cell carcinoma of skin 12/03/2013   L cheek, SCC IS   Squamous cell carcinoma of skin 09/26/2016   L medial cheek, SCC IS   Status post right knee replacement    Stented coronary artery     Patient Active Problem List   Diagnosis Date Noted   Scoliosis 06/04/2020   Pancytopenia (HCC) 06/04/2020   Hx of gout 06/04/2020   Degenerative disc disease, lumbar 06/04/2020   Cancer of true vocal cord (HCC) 06/04/2020   Benign prostatic hyperplasia with incomplete bladder emptying 06/04/2020   Syncope 12/03/2018   Dizziness 12/01/2018   History of total knee replacement, right 04/05/2017    Pain in limb 08/19/2016   Laryngeal cancer (HCC) 06/14/2016   Aortic atherosclerosis (HCC) 05/13/2016   Chest pain 05/07/2016   CAD (coronary artery disease) 05/07/2016   HLD (hyperlipidemia) 05/07/2016   HTN (hypertension) 05/07/2016   History of deep venous thrombosis (DVT) of distal vein of left lower extremity 01/22/2016   Left leg DVT (HCC) 08/13/2015   Tetany 08/13/2015   Osteoarthritis of hip 02/07/2014   H/O cardiac catheterization 02/07/2014   GERD (gastroesophageal reflux disease) 02/07/2014     Physical Exam  Triage Vital Signs: ED Triage Vitals  Enc Vitals Group     BP 12/26/22 0752 (!) 138/51     Pulse Rate 12/26/22 0752 (!) 53     Resp 12/26/22 0752 11     Temp 12/26/22 0752 98 F (36.7 C)     Temp Source 12/26/22 0752 Oral     SpO2 12/26/22 0752 97 %     Weight 12/26/22 0755 181 lb 14.4 oz (82.5 kg)     Height 12/26/22 0755 5\' 10"  (1.778 m)     Head Circumference --      Peak Flow --      Pain Score 12/26/22 0755 7     Pain Loc --      Pain Edu? --      Excl.  in [place]? --     Most recent vital signs: Vitals:   12/26/22 0845 12/26/22 1100  BP:  (!) 109/58  Pulse: (!) 55 90  Resp: 18 17  Temp:    SpO2: 94% 99%     General: Awake, no distress.  CV:  Good peripheral perfusion.  Extremities are warm, no edema Resp:  Normal effort.  Normal work of breathing lung sounds are clear Abd:  No distention.  Mild tenderness in the epigastric region but no guarding abdomen soft Neuro:             Awake, Alert, Oriented x 3  Other:  Patient is able to range the shoulder but does have pain with abduction internal rotation   ED Results / Procedures / Treatments  Labs (all labs ordered are listed, but only abnormal results are displayed) Labs Reviewed  COMPREHENSIVE METABOLIC PANEL - Abnormal; Notable for the following components:      Result Value   BUN 31 (*)    Calcium 8.7 (*)    Total Protein 6.0 (*)    GFR, Estimated 56 (*)    All other components  within normal limits  CBC WITH DIFFERENTIAL/PLATELET - Abnormal; Notable for the following components:   Hemoglobin 12.8 (*)    Platelets 130 (*)    All other components within normal limits  LIPASE, BLOOD  TROPONIN I (HIGH SENSITIVITY)  TROPONIN I (HIGH SENSITIVITY)     EKG  EKG reviewed interpreted myself shows sinus rhythm normal axis intervals low voltage no acute ischemic changes   RADIOLOGY I reviewed and interpreted the CXR which does not show any acute cardiopulmonary process    PROCEDURES:  Critical Care performed: No  Procedures  The patient is on the cardiac monitor to evaluate for evidence of arrhythmia and/or significant heart rate changes.   MEDICATIONS ORDERED IN ED: Medications - No data to display   IMPRESSION / MDM / ASSESSMENT AND PLAN / ED COURSE  I reviewed the triage vital signs and the nursing notes.                              Patient's presentation is most consistent with acute presentation with potential threat to life or bodily function.  Differential diagnosis includes, but is not limited to, ACS including NSTEMI, unstable angina, musculoskeletal pain, reflux/GI related pain, low suspicion for PE or dissection  The patient is a 89 year old male who presents with chest pain/shoulder pain.  This woke him up from sleep and lasted for about 30 minutes with associated nausea but no diaphoresis or dyspnea.  Patient initially tells nursing that his pain is about a 7 out of 10 but when I entered the room he tells me he is pain-free then later says he has discomfort in the upper abdomen but not pain.  Patient is bradycardic with EKG showing sinus bradycardia without obvious ischemic changes.  Diastolic blood pressure slightly low but vitals are otherwise reassuring.  Overall he looks well nontoxic abdominal exam is benign with just some mild tenderness in the epigastric region.  No peripheral edema or signs of DVT and lung sounds are clear.  Plan to  obtain cardiac enzymes CBC CMP and lipase.  Will obtain chest x-ray.  Patient not requiring anything for pain currently.  Troponins x 2 are negative.  Patient per family does have significant short-term memory issues.  He repeatedly asked why he was in the ER, apparently  this is baseline for him.  On reassessment he continues to have no chest pain.  Given no ischemic changes on EKG with negative troponins I do feel that he is safe for discharge.  Discussed return precautions.      FINAL CLINICAL IMPRESSION(S) / ED DIAGNOSES   Final diagnoses:  Chest pain, unspecified type     Rx / DC Orders   ED Discharge Orders     None        Note:  This document was prepared using Dragon voice recognition software and may include unintentional dictation errors.   Georga Hacking, MD 12/26/22 1150

## 2022-12-26 NOTE — Discharge Instructions (Addendum)
Your cardiac workup including your EKG and heart enzymes were reassuring.  If you have recurrent pain that is not resolving then please return to the emergency department.

## 2024-02-16 ENCOUNTER — Emergency Department

## 2024-02-16 ENCOUNTER — Other Ambulatory Visit: Payer: Self-pay

## 2024-02-16 ENCOUNTER — Emergency Department
Admission: EM | Admit: 2024-02-16 | Discharge: 2024-02-16 | Disposition: A | Discharge status: Home / Self Care | Attending: Emergency Medicine | Admitting: Emergency Medicine

## 2024-02-16 ENCOUNTER — Encounter: Payer: Self-pay | Admitting: Emergency Medicine

## 2024-02-16 DIAGNOSIS — F0150 Vascular dementia without behavioral disturbance: Secondary | ICD-10-CM | POA: Diagnosis not present

## 2024-02-16 DIAGNOSIS — R55 Syncope and collapse: Secondary | ICD-10-CM | POA: Insufficient documentation

## 2024-02-16 DIAGNOSIS — I10 Essential (primary) hypertension: Secondary | ICD-10-CM | POA: Diagnosis not present

## 2024-02-16 LAB — BASIC METABOLIC PANEL WITH GFR
Anion gap: 8 (ref 5–15)
BUN: 32 mg/dL — ABNORMAL HIGH (ref 8–23)
CO2: 26 mmol/L (ref 22–32)
Calcium: 8.5 mg/dL — ABNORMAL LOW (ref 8.9–10.3)
Chloride: 105 mmol/L (ref 98–111)
Creatinine, Ser: 1.45 mg/dL — ABNORMAL HIGH (ref 0.61–1.24)
GFR, Estimated: 43 mL/min — ABNORMAL LOW (ref >60)
Glucose, Bld: 118 mg/dL — ABNORMAL HIGH (ref 70–99)
Potassium: 3.9 mmol/L (ref 3.5–5.1)
Sodium: 139 mmol/L (ref 135–145)

## 2024-02-16 LAB — CBC
HCT: 37.6 % — ABNORMAL LOW (ref 39.0–52.0)
Hemoglobin: 12.1 g/dL — ABNORMAL LOW (ref 13.0–17.0)
MCH: 29.4 pg (ref 26.0–34.0)
MCHC: 32.2 g/dL (ref 30.0–36.0)
MCV: 91.5 fL (ref 80.0–100.0)
Platelets: 152 10*3/uL (ref 150–400)
RBC: 4.11 MIL/uL — ABNORMAL LOW (ref 4.22–5.81)
RDW: 14.5 % (ref 11.5–15.5)
WBC: 7.6 10*3/uL (ref 4.0–10.5)
nRBC: 0 % (ref 0.0–0.2)

## 2024-02-16 LAB — HEPATIC FUNCTION PANEL
ALT: 13 U/L (ref 0–44)
AST: 16 U/L (ref 15–41)
Albumin: 3.3 g/dL — ABNORMAL LOW (ref 3.5–5.0)
Alkaline Phosphatase: 54 U/L (ref 38–126)
Bilirubin, Direct: <0.1 mg/dL (ref 0.0–0.2)
Total Bilirubin: 0.6 mg/dL (ref 0.0–1.2)
Total Protein: 6 g/dL — ABNORMAL LOW (ref 6.5–8.1)

## 2024-02-16 LAB — MAGNESIUM: Magnesium: 2.1 mg/dL (ref 1.7–2.4)

## 2024-02-16 LAB — TROPONIN I (HIGH SENSITIVITY)
Troponin I (High Sensitivity): 8 ng/L (ref <18)
Troponin I (High Sensitivity): 6 ng/L (ref <18)

## 2024-02-16 LAB — LACTIC ACID, PLASMA: Lactic Acid, Venous: 0.9 mmol/L (ref 0.5–1.9)

## 2024-02-16 MED ORDER — SODIUM CHLORIDE 0.9 % IV BOLUS
1000.0000 mL | Freq: Once | INTRAVENOUS | Status: AC
  Administered 2024-02-16: 1000 mL via INTRAVENOUS

## 2024-02-16 NOTE — ED Notes (Signed)
 This RN called and spoke with patients son, Matthew David. Matthew David was informed that pt is ready to go back to the facility but the facility stated that they did not have transportation to come get him and patient does not meet medical necessity to be transported via EMS. Matthew David inquired about pt going back to the facility via taxi but this RN informed him that we would not be able to send him in a taxi as he walks with a walker at baseline and he does not have that with him at the hospital. Matthew David states that he is willing to pay out of pocket for EMS to take patient back to the facility.  LuAnn informed to call [facility] for transportation and to informed them that the patients son will be paying out of pocket for transportation.

## 2024-02-16 NOTE — ED Provider Notes (Signed)
 Work up here without concerning imaging findings. Blood work without elevated troponin, patient is slightly anemic however I do not think it would be significant enough to cause syncope. Creatinine slightly elevated which raises the possibility of some dehydration. Patient was given fluid bolus here. Patient stated that he felt fine and in his normal state of health at the time of my exam. I did discuss with patient no clear etiology of the syncopal episode at this time. Did offer admission for further observation and work up. However patient felt comfortable being discharged and I do think this is a reasonable choice given clinical improvement and lack of significant findings.    Floy Roberts, MD 02/16/24 2001

## 2024-02-16 NOTE — ED Provider Notes (Signed)
 [facility] Provider Note    Event Date/Time   First MD Initiated Contact with Patient 02/16/24 1355     (approximate)   History   Near Syncope and Chest Pain   HPI  Matthew David is a 89 y.o. male past medical history significant for vascular dementia, hypertension, hyperlipidemia, who presents to the emergency department following a syncopal episode.  Patient states that he was talking to some ladies at the nursing facility and then started to feel lightheaded and the next and he remembers he was waking up in the ambulance.  States that he believes that he overreacted.  Denies any complaints at this time.  Denies chest pain, shortness of breath, nausea vomiting or abdominal pain.  No diarrhea.  Normal urine output with no dysuria, urinary urgency or frequency.  Denies any new medications or change of medications.  States that he has been taking his medications as prescribed.  No recent falls or trauma.     Physical Exam   Triage Vital Signs: ED Triage Vitals  Encounter Vitals Group     BP 02/16/24 1404 (!) 89/52     Girls Systolic BP Percentile --      Girls Diastolic BP Percentile --      Boys Systolic BP Percentile --      Boys Diastolic BP Percentile --      Pulse Rate 02/16/24 1404 (!) 56     Resp 02/16/24 1404 19     Temp 02/16/24 1404 97.7 F (36.5 C)     Temp Source 02/16/24 1404 Oral     SpO2 02/16/24 1404 96 %     Weight 02/16/24 1400 184 lb 14.4 oz (83.9 kg)     Height 02/16/24 1400 5' 10 (1.778 m)     Head Circumference --      Peak Flow --      Pain Score 02/16/24 1359 3     Pain Loc --      Pain Education --      Exclude from Growth Chart --     Most recent vital signs: Vitals:   02/16/24 1437 02/16/24 1500  BP: (!) 93/45 (!) 112/50  Pulse: (!) 54 (!) 56  Resp: 14 20  Temp:    SpO2: 96% 97%    Physical Exam Constitutional:      Appearance: He is well-developed.  HENT:     Head: Atraumatic.   Eyes:      Conjunctiva/sclera: Conjunctivae normal.    Cardiovascular:     Rate and Rhythm: Regular rhythm.  Pulmonary:     Effort: No respiratory distress.  Abdominal:     Palpations: Abdomen is soft.     Tenderness: There is no abdominal tenderness.   Musculoskeletal:        General: Normal range of motion.     Cervical back: Normal range of motion.     Right lower leg: No edema.     Left lower leg: No edema.   Skin:    General: Skin is warm.   Neurological:     Mental Status: He is alert. Mental status is at baseline.     IMPRESSION / MDM / ASSESSMENT AND PLAN / ED COURSE  I reviewed the triage vital signs and the nursing notes.  Differential diagnosis including infectious process, dehydration, orthostatic hypotension, dysrhythmia, anemia, ACS, urinary tract infection  EKG  I, Clotilda Punter, the attending physician, personally viewed and interpreted this ECG.  Sinus bradycardia with  a rate of 55.  Prolonged PR interval with first-degree block.  Significant wandering baseline with difficult to discern the EKG.  Does appear to have a significantly prolonged PR interval.  No findings of heart block.  QTc 419.  No tachycardic or bradycardic dysrhythmias while on cardiac telemetry.  RADIOLOGY No signs of pneumonia.    LABS (all labs ordered are listed, but only abnormal results are displayed) Labs interpreted as -    Labs Reviewed  BASIC METABOLIC PANEL WITH GFR - Abnormal; Notable for the following components:      Result Value   Glucose, Bld 118 (*)    BUN 32 (*)    Creatinine, Ser 1.45 (*)    Calcium 8.5 (*)    GFR, Estimated 43 (*)    All other components within normal limits  CBC - Abnormal; Notable for the following components:   RBC 4.11 (*)    Hemoglobin 12.1 (*)    HCT 37.6 (*)    All other components within normal limits  HEPATIC FUNCTION PANEL - Abnormal; Notable for the following components:   Total Protein 6.0 (*)    Albumin 3.3 (*)    All other  components within normal limits  MAGNESIUM  URINALYSIS, W/ REFLEX TO CULTURE (INFECTION SUSPECTED)  LACTIC ACID, PLASMA  LACTIC ACID, PLASMA  TROPONIN I (HIGH SENSITIVITY)  TROPONIN I (HIGH SENSITIVITY)     MDM  On arrival to the emergency department patient with a soft blood pressure.  Denies fever or other systemic symptoms concerning for an infectious process.  Will obtain a lactic acid and lab work.  Given 1 L of IV fluid and will reevaluate.  Care transferred to incoming provider with lab work pending.     PROCEDURES:  Critical Care performed: yes  .Critical Care  Performed by: Suzanne Kirsch, MD Authorized by: Suzanne Kirsch, MD   Critical care provider statement:    Critical care time (minutes):  30   Critical care time was exclusive of:  Separately billable procedures and treating other patients   Critical care was necessary to treat or prevent imminent or life-threatening deterioration of the following conditions:  Circulatory failure   Critical care was time spent personally by me on the following activities:  Development of treatment plan with patient or surrogate, discussions with consultants, evaluation of patient's response to treatment, examination of patient, ordering and review of laboratory studies, ordering and review of radiographic studies, ordering and performing treatments and interventions, pulse oximetry, re-evaluation of patient's condition and review of old charts   Patient's presentation is most consistent with acute presentation with potential threat to life or bodily function.   MEDICATIONS ORDERED IN ED: Medications  sodium chloride  0.9 % bolus 1,000 mL (1,000 mLs Intravenous New Bag/Given 02/16/24 1458)    FINAL CLINICAL IMPRESSION(S) / ED DIAGNOSES   Final diagnoses:  Syncope, unspecified syncope type     Rx / DC Orders   ED Discharge Orders     None        Note:  This document was prepared using Dragon voice recognition software  and may include unintentional dictation errors.   Suzanne Kirsch, MD 02/16/24 [phone]

## 2024-02-16 NOTE — ED Triage Notes (Signed)
 Pt to ED via [facility] from [facility] [place] for a syncopal episode. EMS reports that staff at home place reported a blood pressure of 55/42 with a wrist cuff. EMS reports stable vital signs with them with a last BP of 122/99. EMS reports that pt is more responsive now than he was initially.   Pt states that he is unsure if he fully passed out but states that he felt lightheaded prior to the episode. Pt states that he has not been sick recently. Pt reports similar episode a few years ago. Pt states that he ambulates with a walker at baseline.
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
## 2024-08-24 ENCOUNTER — Other Ambulatory Visit: Payer: Self-pay

## 2024-08-24 ENCOUNTER — Emergency Department
Admission: EM | Admit: 2024-08-24 | Discharge: 2024-08-24 | Disposition: A | Discharge status: Skilled Nursing Facility | Source: Home / Self Care | Attending: Emergency Medicine | Admitting: Emergency Medicine

## 2024-08-24 ENCOUNTER — Emergency Department

## 2024-08-24 DIAGNOSIS — R059 Cough, unspecified: Secondary | ICD-10-CM | POA: Insufficient documentation

## 2024-08-24 DIAGNOSIS — I10 Essential (primary) hypertension: Secondary | ICD-10-CM | POA: Diagnosis not present

## 2024-08-24 LAB — COMPREHENSIVE METABOLIC PANEL WITH GFR
ALT: 13 U/L (ref 0–44)
AST: 20 U/L (ref 15–41)
Albumin: 4.2 g/dL (ref 3.5–5.0)
Alkaline Phosphatase: 78 U/L (ref 38–126)
Anion gap: 13 (ref 5–15)
BUN: 27 mg/dL — ABNORMAL HIGH (ref 8–23)
CO2: 26 mmol/L (ref 22–32)
Calcium: 8.8 mg/dL — ABNORMAL LOW (ref 8.9–10.3)
Chloride: 106 mmol/L (ref 98–111)
Creatinine, Ser: 1.01 mg/dL (ref 0.61–1.24)
GFR, Estimated: >60 mL/min
Glucose, Bld: 119 mg/dL — ABNORMAL HIGH (ref 70–99)
Potassium: 3.6 mmol/L (ref 3.5–5.1)
Sodium: 144 mmol/L (ref 135–145)
Total Bilirubin: 0.4 mg/dL (ref 0.0–1.2)
Total Protein: 6.5 g/dL (ref 6.5–8.1)

## 2024-08-24 LAB — RESP PANEL BY RT-PCR (RSV, FLU A&B, COVID)  RVPGX2
Influenza A by PCR: NEGATIVE
Influenza B by PCR: NEGATIVE
Resp Syncytial Virus by PCR: NEGATIVE
SARS Coronavirus 2 by RT PCR: NEGATIVE

## 2024-08-24 LAB — CBC
HCT: 38.3 % — ABNORMAL LOW (ref 39.0–52.0)
Hemoglobin: 12.5 g/dL — ABNORMAL LOW (ref 13.0–17.0)
MCH: 29.3 pg (ref 26.0–34.0)
MCHC: 32.6 g/dL (ref 30.0–36.0)
MCV: 89.9 fL (ref 80.0–100.0)
Platelets: 106 K/uL — ABNORMAL LOW (ref 150–400)
RBC: 4.26 MIL/uL (ref 4.22–5.81)
RDW: 14.6 % (ref 11.5–15.5)
WBC: 9.6 K/uL (ref 4.0–10.5)
nRBC: 0 % (ref 0.0–0.2)

## 2024-08-24 NOTE — Discharge Instructions (Signed)
 Your workup in the emergency department today showed normal results.  Please follow-up with your doctor early this coming week for recheck/reevaluation.  Return to the emergency department for any symptom concerning to yourself or staff members.

## 2024-08-24 NOTE — ED Triage Notes (Signed)
 Pt arrives via [facility] from [facility] for a productive cough and not feeling well. BP with staff at [facility] was 208/108. BP with EMS was 218/108. Hx of hypertension. No falls recently.   EMS vitals: 128 CBG 98.77F temp 96% SPO2 on RA 110 HR

## 2024-08-24 NOTE — ED Notes (Addendum)
 Voicemail with this nurse's name and call back number left for Norleen Coe and Sharman Seip, pts son and daughter listed in chart.

## 2024-08-24 NOTE — ED Provider Notes (Signed)
 "  [facility] Provider Note    Event Date/Time   First MD Initiated Contact with Patient 08/24/24 1807     (approximate)  History   Chief Complaint: Cough and Hypertension  HPI  Matthew David is a 89 y.o. male with a past medical history of hypertension, presents to the emergency department for medical evaluation.  According to EMS report patient is coming from [facility] for productive cough and not feeling well.  Staff also noted the patient's blood pressure was elevated 208/108.  218 per EMS.  Patient has a history of hypertension currently 182/91 in the emergency department.  Here the patient is awake he is alert he denies any pain.  Physical Exam   Triage Vital Signs: ED Triage Vitals  Encounter Vitals Group     BP 08/24/24 1800 (!) 182/91     Girls Systolic BP Percentile --      Girls Diastolic BP Percentile --      Boys Systolic BP Percentile --      Boys Diastolic BP Percentile --      Pulse Rate 08/24/24 1800 (!) 110     Resp 08/24/24 1800 20     Temp 08/24/24 1800 98.8 F (37.1 C)     Temp src --      SpO2 08/24/24 1800 97 %     Weight 08/24/24 1801 176 lb 9.4 oz (80.1 kg)     Height --      Head Circumference --      Peak Flow --      Pain Score 08/24/24 1801 0     Pain Loc --      Pain Education --      Exclude from Growth Chart --     Most recent vital signs: Vitals:   08/24/24 1800  BP: (!) 182/91  Pulse: (!) 110  Resp: 20  Temp: 98.8 F (37.1 C)  SpO2: 97%    General: Awake, no distress.  CV:  Good peripheral perfusion.  Regular rate and rhythm  Resp:  Normal effort.  Equal breath sounds bilaterally.  Abd:  No distention.  Soft, nontender.    ED Results / Procedures / Treatments    RADIOLOGY  I have reviewed and interpreted the chest x-ray images.  Some mild haziness bilaterally but no focal consolidation.   MEDICATIONS ORDERED IN ED: Medications - No data to  display   IMPRESSION / MDM / ASSESSMENT AND PLAN / ED COURSE  I reviewed the triage vital signs and the nursing notes.  Patient's presentation is most consistent with acute presentation with potential threat to life or bodily function.  Patient presents to the emergency department for cough over the last 2 days now hypertensive.  History of hypertension but more significantly elevated today currently 182/91.  Here the patient is calm and cooperative no distress.  Poor historian.  We will check labs we will obtain a chest x-ray will obtain a respiratory swab but continue to closely monitor.  Blood pressure although elevated not concerningly so currently 182/91.   Patient's workup today is reassuring with a reassuring chest x-ray.  Respiratory panel is negative.  CBC shows no concerning findings and chemistry is reassuring as well.  Patient's blood pressure has come down on its own currently 139/65.  As the patient appears well given his reassuring workup reassuring results and reassuring exam I believe the patient is safe for discharge home with outpatient follow-up.  We will discharge back to his care facility.  FINAL CLINICAL IMPRESSION(S) / ED DIAGNOSES   Hypertension Cough   Note:  This document was prepared using Dragon voice recognition software and may include unintentional dictation errors.   Dorothyann Drivers, MD 08/24/24 1937  "

## 2024-08-24 NOTE — ED Notes (Signed)
 This RN called [facility] and spoke with Aya, Med [employer]. She was given a full report on pt and made aware of pt's arrival back to facility at this time.

## 2024-09-20 ENCOUNTER — Emergency Department

## 2024-09-20 ENCOUNTER — Emergency Department
Admission: EM | Admit: 2024-09-20 | Discharge: 2024-09-20 | Disposition: A | Discharge status: Skilled Nursing Facility | Attending: Emergency Medicine | Admitting: Emergency Medicine

## 2024-09-20 DIAGNOSIS — Z8521 Personal history of malignant neoplasm of larynx: Secondary | ICD-10-CM | POA: Diagnosis not present

## 2024-09-20 DIAGNOSIS — F0390 Unspecified dementia without behavioral disturbance: Secondary | ICD-10-CM | POA: Diagnosis not present

## 2024-09-20 DIAGNOSIS — W06XXXA Fall from bed, initial encounter: Secondary | ICD-10-CM | POA: Diagnosis not present

## 2024-09-20 DIAGNOSIS — S0001XA Abrasion of scalp, initial encounter: Secondary | ICD-10-CM | POA: Insufficient documentation

## 2024-09-20 DIAGNOSIS — S0990XA Unspecified injury of head, initial encounter: Secondary | ICD-10-CM | POA: Diagnosis present

## 2024-09-20 DIAGNOSIS — I10 Essential (primary) hypertension: Secondary | ICD-10-CM | POA: Diagnosis not present

## 2024-09-20 DIAGNOSIS — R296 Repeated falls: Secondary | ICD-10-CM

## 2024-09-20 DIAGNOSIS — Z96651 Presence of right artificial knee joint: Secondary | ICD-10-CM | POA: Insufficient documentation

## 2024-09-20 NOTE — ED Triage Notes (Signed)
 Pt to ED BIB EMS from [facility] with c/o unwitnessed fall out of bed. Pt denies any complaints. Small scab noted to top of head but no other injuries or active bleeding noted. Pt alert, VSS

## 2024-09-20 NOTE — ED Notes (Signed)
 Called [facility] for transport to [facility]  [phone]

## 2024-09-20 NOTE — ED Provider Notes (Signed)
 "  [facility] Provider Note    Event Date/Time   First MD Initiated Contact with Patient 09/20/24 0302     (approximate)   History   Chief Complaint: Fall   HPI  Matthew David is a 89 y.o. male with dementia, hypertension who is brought to the ED due to an unwitnessed fall.  Was found on the floor next to his bed, apparently having rolled off the bed.  Patient denies any pain or other acute complaints.  States he feels fine and at baseline.        Past Medical History:  Diagnosis Date   Cancer [facility])    Larynx Cancer 03/2015, Rad tx's.   DVT (deep venous thrombosis) (HCC)    Hx of appendectomy    Hx of squamous cell carcinoma 2015   multiple sites    Hypertension    Squamous cell carcinoma of skin 12/03/2013   L cheek, SCC IS   Squamous cell carcinoma of skin 09/26/2016   L medial cheek, SCC IS   Status post right knee replacement    Stented coronary artery     Current Outpatient Rx   Order #: [id number] Class: Historical Med   Order #: [id number] Class: Historical Med   Order #: [id number] Class: Historical Med   Order #: [id number] Class: Historical Med   Order #: [id number] Class: Historical Med   Order #: [id number] Class: Historical Med   Order #: [id number] Class: Historical Med   Order #: [id number] Class: Normal   Order #: [id number] Class: Historical Med   Order #: [id number] Class: Historical Med   Order #: [id number] Class: Historical Med   Order #: [id number] Class: Historical Med   Order #: [id number] Class: Historical Med   Order #: [id number] Class: Historical Med   Order #: [id number] Class: Historical Med   Order #: [id number] Class: Historical Med   Order #: [id number] Class: Historical Med   Order #: [id number] Class: Historical Med   Order #: [id number] Class: Historical Med   Order #: [id number] Class: Historical Med   Order #: [id number] Class: Historical Med   Order #: [id number] Class: Historical Med   Order #: [id number] Class: Historical Med   Order  #: [id number] Class: Historical Med   Order #: [id number] Class: Historical Med    Past Surgical History:  Procedure Laterality Date   APPENDECTOMY  1944   CATARACT EXTRACTION W/ INTRAOCULAR LENS  IMPLANT, BILATERAL Bilateral 2011   both eye done in the same year   CORONARY ANGIOPLASTY WITH STENT PLACEMENT  2012   DIRECT LARYNGOSCOPY N/A 04/08/2015   Procedure: DIRECT LARYNGOSCOPY;  Surgeon: Deward Dolly, MD;  Location: [facility] ORS;  Service: ENT;  Laterality: N/A;   JOINT REPLACEMENT Right 2008   knee   KNEE ARTHROSCOPY Right 2006   SHOULDER SURGERY Left 2006   TONSILLECTOMY  1952    Physical Exam   Triage Vital Signs: ED Triage Vitals [09/20/24 0311]  Encounter Vitals Group     BP 112/62     Girls Systolic BP Percentile      Girls Diastolic BP Percentile      Boys Systolic BP Percentile      Boys Diastolic BP Percentile      Pulse Rate (!) 55     Resp 16     Temp 98 F (36.7 C)     Temp src      SpO2 97 %     Weight      Height      Head Circumference      Peak Flow      Pain  Score 0     Pain Loc      Pain Education      Exclude from Growth Chart     Most recent vital signs: Vitals:   09/20/24 0311  BP: 112/62  Pulse: (!) 55  Resp: 16  Temp: 98 F (36.7 C)  SpO2: 97%    General: Awake, no distress.  CV:  Good peripheral perfusion.  Regular rate rhythm Resp:  Normal effort.  Clear lungs Abd:  No distention.  Soft nontender Other:  Small scab at the vertex of the head, no recent/fresh bleeding.  Some scattered skin lesions concerning for metaplasia.  Full range of motion all extremities.  Cranial nerves II through XII intact.  No focal neurodeficits.  No bony tenderness, no deformities, no wounds   ED Results / Procedures / Treatments   Labs (all labs ordered are listed, but only abnormal results are displayed) Labs Reviewed - No data to display   EKG    RADIOLOGY CT head and cervical spine  unremarkable.   PROCEDURES:  Procedures   MEDICATIONS ORDERED IN ED: Medications - No data to display   IMPRESSION / MDM / ASSESSMENT AND PLAN / ED COURSE  I reviewed the triage vital signs and the nursing notes.  DDx: Intracranial hemorrhage, skull fracture, C-spine fracture, contusion  Patient's presentation is most consistent with acute presentation with potential threat to life or bodily function.  Patient presents with minor trauma, unwitnessed fall from low height.  No complaints.  Due to his advanced age, and potentially limited history from his mild dementia, CT scan imaging was obtained which is negative.  Stable for discharge.       FINAL CLINICAL IMPRESSION(S) / ED DIAGNOSES   Final diagnoses:  Unwitnessed fall  Chronic dementia (HCC)     Rx / DC Orders   ED Discharge Orders     None        Note:  This document was prepared using Dragon voice recognition software and may include unintentional dictation errors.   Viviann Pastor, MD 09/20/24 [phone]  "
